# Patient Record
Sex: Male | Born: 1940 | Race: White | Hispanic: No | Marital: Married | Smoking: Former smoker
Health system: Southern US, Community
[De-identification: ages and names within clinical notes are randomized; demographics above are authoritative.]

## PROBLEM LIST (undated history)

## (undated) DIAGNOSIS — Z972 Presence of dental prosthetic device (complete) (partial): Secondary | ICD-10-CM

## (undated) DIAGNOSIS — I10 Essential (primary) hypertension: Secondary | ICD-10-CM

## (undated) DIAGNOSIS — I872 Venous insufficiency (chronic) (peripheral): Secondary | ICD-10-CM

## (undated) DIAGNOSIS — N400 Enlarged prostate without lower urinary tract symptoms: Secondary | ICD-10-CM

## (undated) DIAGNOSIS — I4891 Unspecified atrial fibrillation: Secondary | ICD-10-CM

## (undated) DIAGNOSIS — C801 Malignant (primary) neoplasm, unspecified: Secondary | ICD-10-CM

## (undated) DIAGNOSIS — R0989 Other specified symptoms and signs involving the circulatory and respiratory systems: Secondary | ICD-10-CM

## (undated) DIAGNOSIS — I350 Nonrheumatic aortic (valve) stenosis: Secondary | ICD-10-CM

## (undated) HISTORY — PX: APPENDECTOMY: SHX54

## (undated) HISTORY — DX: Venous insufficiency (chronic) (peripheral): I87.2

## (undated) HISTORY — DX: Essential (primary) hypertension: I10

## (undated) HISTORY — PX: BACK SURGERY: SHX140

## (undated) HISTORY — PX: REPLACEMENT TOTAL KNEE BILATERAL: SUR1225

---

## 2005-08-27 ENCOUNTER — Other Ambulatory Visit: Payer: Self-pay

## 2005-08-27 ENCOUNTER — Inpatient Hospital Stay: Payer: Self-pay | Admitting: Specialist

## 2005-09-03 ENCOUNTER — Ambulatory Visit: Payer: Self-pay | Admitting: Specialist

## 2005-09-07 ENCOUNTER — Emergency Department: Payer: Self-pay | Admitting: Internal Medicine

## 2005-09-24 ENCOUNTER — Ambulatory Visit: Payer: Self-pay | Admitting: Specialist

## 2015-01-19 ENCOUNTER — Ambulatory Visit: Payer: Self-pay | Admitting: Internal Medicine

## 2015-12-17 DIAGNOSIS — Z08 Encounter for follow-up examination after completed treatment for malignant neoplasm: Secondary | ICD-10-CM | POA: Diagnosis not present

## 2015-12-17 DIAGNOSIS — L570 Actinic keratosis: Secondary | ICD-10-CM | POA: Diagnosis not present

## 2015-12-17 DIAGNOSIS — D485 Neoplasm of uncertain behavior of skin: Secondary | ICD-10-CM | POA: Diagnosis not present

## 2015-12-17 DIAGNOSIS — D044 Carcinoma in situ of skin of scalp and neck: Secondary | ICD-10-CM | POA: Diagnosis not present

## 2015-12-17 DIAGNOSIS — X32XXXA Exposure to sunlight, initial encounter: Secondary | ICD-10-CM | POA: Diagnosis not present

## 2015-12-17 DIAGNOSIS — C44319 Basal cell carcinoma of skin of other parts of face: Secondary | ICD-10-CM | POA: Diagnosis not present

## 2015-12-17 DIAGNOSIS — L905 Scar conditions and fibrosis of skin: Secondary | ICD-10-CM | POA: Diagnosis not present

## 2015-12-17 DIAGNOSIS — Z85828 Personal history of other malignant neoplasm of skin: Secondary | ICD-10-CM | POA: Diagnosis not present

## 2015-12-17 DIAGNOSIS — D0439 Carcinoma in situ of skin of other parts of face: Secondary | ICD-10-CM | POA: Diagnosis not present

## 2016-01-10 DIAGNOSIS — C44321 Squamous cell carcinoma of skin of nose: Secondary | ICD-10-CM | POA: Diagnosis not present

## 2016-01-10 DIAGNOSIS — Z85828 Personal history of other malignant neoplasm of skin: Secondary | ICD-10-CM | POA: Diagnosis not present

## 2016-01-17 DIAGNOSIS — Z85828 Personal history of other malignant neoplasm of skin: Secondary | ICD-10-CM | POA: Diagnosis not present

## 2016-01-17 DIAGNOSIS — C44319 Basal cell carcinoma of skin of other parts of face: Secondary | ICD-10-CM | POA: Diagnosis not present

## 2016-02-11 DIAGNOSIS — D044 Carcinoma in situ of skin of scalp and neck: Secondary | ICD-10-CM | POA: Diagnosis not present

## 2016-03-27 DIAGNOSIS — I517 Cardiomegaly: Secondary | ICD-10-CM | POA: Diagnosis not present

## 2016-03-27 DIAGNOSIS — I10 Essential (primary) hypertension: Secondary | ICD-10-CM | POA: Diagnosis not present

## 2016-03-27 DIAGNOSIS — E784 Other hyperlipidemia: Secondary | ICD-10-CM | POA: Diagnosis not present

## 2016-05-22 DIAGNOSIS — X32XXXA Exposure to sunlight, initial encounter: Secondary | ICD-10-CM | POA: Diagnosis not present

## 2016-05-22 DIAGNOSIS — Z85828 Personal history of other malignant neoplasm of skin: Secondary | ICD-10-CM | POA: Diagnosis not present

## 2016-05-22 DIAGNOSIS — Z08 Encounter for follow-up examination after completed treatment for malignant neoplasm: Secondary | ICD-10-CM | POA: Diagnosis not present

## 2016-05-22 DIAGNOSIS — L570 Actinic keratosis: Secondary | ICD-10-CM | POA: Diagnosis not present

## 2016-09-25 DIAGNOSIS — E8881 Metabolic syndrome: Secondary | ICD-10-CM | POA: Diagnosis not present

## 2016-09-25 DIAGNOSIS — Z885 Allergy status to narcotic agent status: Secondary | ICD-10-CM | POA: Diagnosis not present

## 2016-09-25 DIAGNOSIS — I517 Cardiomegaly: Secondary | ICD-10-CM | POA: Diagnosis not present

## 2016-09-25 DIAGNOSIS — M549 Dorsalgia, unspecified: Secondary | ICD-10-CM | POA: Diagnosis not present

## 2016-11-07 DIAGNOSIS — J988 Other specified respiratory disorders: Secondary | ICD-10-CM | POA: Diagnosis not present

## 2017-01-22 DIAGNOSIS — E784 Other hyperlipidemia: Secondary | ICD-10-CM | POA: Diagnosis not present

## 2017-01-22 DIAGNOSIS — I10 Essential (primary) hypertension: Secondary | ICD-10-CM | POA: Diagnosis not present

## 2017-01-22 DIAGNOSIS — Z885 Allergy status to narcotic agent status: Secondary | ICD-10-CM | POA: Diagnosis not present

## 2017-01-22 DIAGNOSIS — I517 Cardiomegaly: Secondary | ICD-10-CM | POA: Diagnosis not present

## 2017-03-10 DIAGNOSIS — C44329 Squamous cell carcinoma of skin of other parts of face: Secondary | ICD-10-CM | POA: Diagnosis not present

## 2017-03-10 DIAGNOSIS — L820 Inflamed seborrheic keratosis: Secondary | ICD-10-CM | POA: Diagnosis not present

## 2017-03-10 DIAGNOSIS — L570 Actinic keratosis: Secondary | ICD-10-CM | POA: Diagnosis not present

## 2017-03-10 DIAGNOSIS — D485 Neoplasm of uncertain behavior of skin: Secondary | ICD-10-CM | POA: Diagnosis not present

## 2017-03-10 DIAGNOSIS — Z08 Encounter for follow-up examination after completed treatment for malignant neoplasm: Secondary | ICD-10-CM | POA: Diagnosis not present

## 2017-03-10 DIAGNOSIS — Z85828 Personal history of other malignant neoplasm of skin: Secondary | ICD-10-CM | POA: Diagnosis not present

## 2017-04-21 DIAGNOSIS — D485 Neoplasm of uncertain behavior of skin: Secondary | ICD-10-CM | POA: Diagnosis not present

## 2017-04-21 DIAGNOSIS — L905 Scar conditions and fibrosis of skin: Secondary | ICD-10-CM | POA: Diagnosis not present

## 2017-04-21 DIAGNOSIS — C44329 Squamous cell carcinoma of skin of other parts of face: Secondary | ICD-10-CM | POA: Diagnosis not present

## 2017-04-21 DIAGNOSIS — L570 Actinic keratosis: Secondary | ICD-10-CM | POA: Diagnosis not present

## 2017-04-23 DIAGNOSIS — I10 Essential (primary) hypertension: Secondary | ICD-10-CM | POA: Diagnosis not present

## 2017-04-23 DIAGNOSIS — Z885 Allergy status to narcotic agent status: Secondary | ICD-10-CM | POA: Diagnosis not present

## 2017-04-23 DIAGNOSIS — E784 Other hyperlipidemia: Secondary | ICD-10-CM | POA: Diagnosis not present

## 2017-04-23 DIAGNOSIS — I517 Cardiomegaly: Secondary | ICD-10-CM | POA: Diagnosis not present

## 2017-07-20 DIAGNOSIS — K210 Gastro-esophageal reflux disease with esophagitis: Secondary | ICD-10-CM | POA: Diagnosis not present

## 2017-07-20 DIAGNOSIS — N476 Balanoposthitis: Secondary | ICD-10-CM | POA: Diagnosis not present

## 2017-07-20 DIAGNOSIS — N478 Other disorders of prepuce: Secondary | ICD-10-CM | POA: Diagnosis not present

## 2017-07-20 DIAGNOSIS — I8312 Varicose veins of left lower extremity with inflammation: Secondary | ICD-10-CM | POA: Diagnosis not present

## 2017-07-21 DIAGNOSIS — D485 Neoplasm of uncertain behavior of skin: Secondary | ICD-10-CM | POA: Diagnosis not present

## 2017-07-21 DIAGNOSIS — D0461 Carcinoma in situ of skin of right upper limb, including shoulder: Secondary | ICD-10-CM | POA: Diagnosis not present

## 2017-07-21 DIAGNOSIS — L821 Other seborrheic keratosis: Secondary | ICD-10-CM | POA: Diagnosis not present

## 2017-07-21 DIAGNOSIS — Z08 Encounter for follow-up examination after completed treatment for malignant neoplasm: Secondary | ICD-10-CM | POA: Diagnosis not present

## 2017-07-21 DIAGNOSIS — Z85828 Personal history of other malignant neoplasm of skin: Secondary | ICD-10-CM | POA: Diagnosis not present

## 2017-07-21 DIAGNOSIS — C44311 Basal cell carcinoma of skin of nose: Secondary | ICD-10-CM | POA: Diagnosis not present

## 2017-07-26 DIAGNOSIS — R300 Dysuria: Secondary | ICD-10-CM | POA: Diagnosis not present

## 2017-07-26 DIAGNOSIS — N3001 Acute cystitis with hematuria: Secondary | ICD-10-CM | POA: Diagnosis not present

## 2017-09-08 DIAGNOSIS — D0461 Carcinoma in situ of skin of right upper limb, including shoulder: Secondary | ICD-10-CM | POA: Diagnosis not present

## 2017-09-09 DIAGNOSIS — C44311 Basal cell carcinoma of skin of nose: Secondary | ICD-10-CM | POA: Diagnosis not present

## 2017-09-09 DIAGNOSIS — Z85828 Personal history of other malignant neoplasm of skin: Secondary | ICD-10-CM | POA: Diagnosis not present

## 2017-12-15 DIAGNOSIS — C44629 Squamous cell carcinoma of skin of left upper limb, including shoulder: Secondary | ICD-10-CM | POA: Diagnosis not present

## 2017-12-15 DIAGNOSIS — L570 Actinic keratosis: Secondary | ICD-10-CM | POA: Diagnosis not present

## 2017-12-15 DIAGNOSIS — Z85828 Personal history of other malignant neoplasm of skin: Secondary | ICD-10-CM | POA: Diagnosis not present

## 2017-12-15 DIAGNOSIS — D485 Neoplasm of uncertain behavior of skin: Secondary | ICD-10-CM | POA: Diagnosis not present

## 2017-12-15 DIAGNOSIS — Z08 Encounter for follow-up examination after completed treatment for malignant neoplasm: Secondary | ICD-10-CM | POA: Diagnosis not present

## 2017-12-29 ENCOUNTER — Encounter (INDEPENDENT_AMBULATORY_CARE_PROVIDER_SITE_OTHER): Payer: Self-pay | Admitting: Vascular Surgery

## 2017-12-29 ENCOUNTER — Ambulatory Visit (INDEPENDENT_AMBULATORY_CARE_PROVIDER_SITE_OTHER): Payer: Medicare Other | Admitting: Vascular Surgery

## 2017-12-29 VITALS — BP 148/94 | HR 73 | Resp 18 | Ht 73.0 in | Wt 262.0 lb

## 2017-12-29 DIAGNOSIS — M7989 Other specified soft tissue disorders: Secondary | ICD-10-CM

## 2017-12-29 DIAGNOSIS — I10 Essential (primary) hypertension: Secondary | ICD-10-CM | POA: Insufficient documentation

## 2017-12-29 DIAGNOSIS — I83892 Varicose veins of left lower extremities with other complications: Secondary | ICD-10-CM | POA: Diagnosis not present

## 2017-12-29 NOTE — Assessment & Plan Note (Signed)
blood pressure control important in reducing the progression of atherosclerotic disease. On appropriate oral medications.  

## 2017-12-29 NOTE — Patient Instructions (Signed)

## 2017-12-29 NOTE — Assessment & Plan Note (Signed)
Likely from venous disease but could have a component of lymphedema or other issues as well.  Venous reflux study pending.  Continue to wear compression stockings, elevate his legs, and increase his activity.

## 2017-12-29 NOTE — Progress Notes (Signed)
Patient ID: Mark Blevins, male   DOB: 1941/09/09, 77 y.o.   MRN: [id number]  Chief Complaint  Patient presents with  . New Patient (Initial Visit)    Left leg swelling    HPI Mark Blevins is a 77 y.o. male.  Patient present for evaluation of worsening left leg swelling and varicose veins.  He has been previously seen in the practice but not for about 4 years.  At that time, he had what sounds like a laser ablation of the left great saphenous vein.  He did reasonably well but over the past several months, he has had worsening swelling, pain, and varicosities developing in the left lower leg.  This gets better with elevation but progresses throughout the day.  The patient reports no antecedent history of DVT or superficial thrombophlebitis.  No right leg symptoms.  There is no clear inciting event that started the symptoms.  He wears compression stockings regularly and has since he was seen many years ago.  Current Outpatient Medications  Medication Sig Dispense Refill  . amLODipine (NORVASC) 10 MG tablet TK 1 T PO D  1  . cloNIDine (CATAPRES) 0.1 MG tablet TK 1 T PO D  3  . labetalol (NORMODYNE) 200 MG tablet TK 1 T PO D  3   No current facility-administered medications for this visit.      Past Medical History:  Diagnosis Date  . Hypertension     Past Surgical History Venous ablation of the left leg  Family History No bleeding disorders, clotting disorders, autoimmune diseases, or aneurysms  Social History Social History   Tobacco Use  . Smoking status: Former Games developer  . Smokeless tobacco: Never Used  . Tobacco comment: quit smoking 1969 or 1970  Substance Use Topics  . Alcohol use: Yes    Comment: socially  . Drug use: Not on file     No Known Allergies      REVIEW OF SYSTEMS (Negative unless checked)  Constitutional: [] Weight loss  [] Fever  [] Chills Cardiac: [] Chest pain   [] Chest pressure   [] Palpitations   [] Shortness of breath when laying flat    [] Shortness of breath at rest   [] Shortness of breath with exertion. Vascular:  [] Pain in legs with walking   [] Pain in legs at rest   [] Pain in legs when laying flat   [] Claudication   [] Pain in feet when walking  [] Pain in feet at rest  [] Pain in feet when laying flat   [] History of DVT   [] Phlebitis   [x] Swelling in legs   [x] Varicose veins   [] Non-healing ulcers Pulmonary:   [] Uses home oxygen   [] Productive cough   [] Hemoptysis   [] Wheeze  [] COPD   [] Asthma Neurologic:  [] Dizziness  [] Blackouts   [] Seizures   [] History of stroke   [] History of TIA  [] Aphasia   [] Temporary blindness   [] Dysphagia   [] Weakness or numbness in arms   [] Weakness or numbness in legs Musculoskeletal:  [x] Arthritis   [] Joint swelling   [] Joint pain   [] Low back pain Hematologic:  [] Easy bruising  [] Easy bleeding   [] Hypercoagulable state   [] Anemic  [] Hepatitis  Gastrointestinal:  [] Blood in stool   [] Vomiting blood  [] Gastroesophageal reflux/heartburn   [] Difficulty swallowing   Genitourinary:  [] Chronic kidney disease   [] Difficult urination  [] Frequent urination  [] Burning with urination   [] Blood in urine Skin:  [] Rashes   [] Ulcers   [] Wounds Psychological:  [] History of anxiety   []   History of major depression.  Physical Exam BP (!) 148/94 (BP Location: Left Arm, Patient Position: Sitting)   Pulse 73   Resp 18   Ht 6\' 1"  (1.854 m)   Wt 118.8 kg (262 lb)   BMI 34.57 kg/m   Gen:  WD/WN, NAD Head: [place]/AT, No temporalis wasting.  Ear/Nose/Throat: Hearing grossly intact, nares w/o erythema or drainage, oropharynx w/o Erythema/Exudate Eyes: Sclera non-icteric, conjunctiva clear Neck: Trachea midline.  No JVD.  Pulmonary:  Good air movement, no use of accessory muscles.  Cardiac: RRR, no JVD Vascular:  Vessel Right Left  Radial Palpable Palpable                          PT Palpable Not Palpable  DP Palpable 1+ Palpable    Musculoskeletal: M/S 5/5 throughout.  Extremities without ischemic changes.   No deformity or atrophy.  No right lower extremity edema.  2+ left lower extremity edema. Varicosities diffuse and measuring 3-4 mm in the left lower extremity.  Scattered in the right leg. Neurologic:  Sensation grossly intact in extremities.  Symmetrical.  Speech is fluent. Motor exam as listed above. Psychiatric: Judgment intact, Mood & affect appropriate for pt's clinical situation. Dermatologic: No rashes or ulcers noted.  No cellulitis or open wounds.     Radiology No results found.  Labs No results found for this or any previous visit (from the past 2160 hour(s)).  Assessment/Plan:  Hypertension blood pressure control important in reducing the progression of atherosclerotic disease. On appropriate oral medications.   Swelling of limb Likely from venous disease but could have a component of lymphedema or other issues as well.  Venous reflux study pending.  Continue to wear compression stockings, elevate his legs, and increase his activity.  Varicose veins of leg with swelling, left The patient has recurrent venous symptoms of the left lower extremity.  This is despite using appropriate conservative therapy for years now.  I have recommended a venous duplex for further evaluation to determine what veins are involved in the appropriate therapies may be going forward.  The patient and I had a long discussion regarding the pathophysiology and natural history of venous disease.  I will see him back after his study.     Festus BarrenJason Anastasya Jewell 12/29/2017, 2:14 PM   This note was created with [facility] medical dictation system.  Any errors from dictation are unintentional.

## 2017-12-29 NOTE — Assessment & Plan Note (Signed)
The patient has recurrent venous symptoms of the left lower extremity.  This is despite using appropriate conservative therapy for years now.  I have recommended a venous duplex for further evaluation to determine what veins are involved in the appropriate therapies may be going forward.  The patient and I had a long discussion regarding the pathophysiology and natural history of venous disease.  I will see him back after his study.

## 2018-01-21 DIAGNOSIS — C44629 Squamous cell carcinoma of skin of left upper limb, including shoulder: Secondary | ICD-10-CM | POA: Diagnosis not present

## 2018-01-21 DIAGNOSIS — D0462 Carcinoma in situ of skin of left upper limb, including shoulder: Secondary | ICD-10-CM | POA: Diagnosis not present

## 2018-01-21 DIAGNOSIS — L905 Scar conditions and fibrosis of skin: Secondary | ICD-10-CM | POA: Diagnosis not present

## 2018-02-15 DIAGNOSIS — K210 Gastro-esophageal reflux disease with esophagitis: Secondary | ICD-10-CM | POA: Diagnosis not present

## 2018-02-15 DIAGNOSIS — E8881 Metabolic syndrome: Secondary | ICD-10-CM | POA: Diagnosis not present

## 2018-02-15 DIAGNOSIS — N478 Other disorders of prepuce: Secondary | ICD-10-CM | POA: Diagnosis not present

## 2018-02-15 DIAGNOSIS — N476 Balanoposthitis: Secondary | ICD-10-CM | POA: Diagnosis not present

## 2018-03-30 ENCOUNTER — Ambulatory Visit (INDEPENDENT_AMBULATORY_CARE_PROVIDER_SITE_OTHER): Payer: Medicare Other

## 2018-03-30 ENCOUNTER — Encounter (INDEPENDENT_AMBULATORY_CARE_PROVIDER_SITE_OTHER): Payer: Self-pay | Admitting: Vascular Surgery

## 2018-03-30 ENCOUNTER — Ambulatory Visit (INDEPENDENT_AMBULATORY_CARE_PROVIDER_SITE_OTHER): Payer: Medicare Other | Admitting: Vascular Surgery

## 2018-03-30 VITALS — BP 135/96 | HR 78 | Resp 15 | Ht 73.0 in | Wt 256.0 lb

## 2018-03-30 DIAGNOSIS — I10 Essential (primary) hypertension: Secondary | ICD-10-CM | POA: Diagnosis not present

## 2018-03-30 DIAGNOSIS — I83892 Varicose veins of left lower extremities with other complications: Secondary | ICD-10-CM

## 2018-03-30 NOTE — Assessment & Plan Note (Signed)
The venous reflux study demonstrates reflux in what appears to be an accessory great saphenous vein on the left.  This course is more medially and superficially than a typical saphenous vein in the great saphenous vein was ablated some years ago.  No DVT or superficial thrombophlebitis was identified.  That said, as he has had significant improvement with conservative measures, I think avoidance of intervention at this time and continuing his conservative therapy would be a better option.  He is in agreement with this.  I will see him back in 5 to 6 months to see how he is doing with continued conservative therapy.

## 2018-03-30 NOTE — Assessment & Plan Note (Signed)
blood pressure control important in reducing the progression of atherosclerotic disease. On appropriate oral medications.  

## 2018-03-30 NOTE — Progress Notes (Signed)
Patient ID: Mark Blevins, male   DOB: [birth year], 77 y.o.   MRN: [id number]  Chief Complaint  Patient presents with  . Follow-up    3 month LLE venous reflux    HPI Mark Blevins is a 77 y.o. male.  Patient returns in follow up of their venous disease.  They have done their best to comply with the prescribed conservative therapies of compression stockings, leg elevation, exercise, and still requires anti-inflammatories for discomfort.  He is actually doing a fair bit better and the swelling in the left leg is significantly proved.  It is still swollen, but much better.  The biggest improvement he says is due to a marked increase in his walking.  The venous reflux study demonstrates reflux in what appears to be an accessory great saphenous vein on the left.  This course is more medially and superficially than a typical saphenous vein in the great saphenous vein was ablated some years ago.  No DVT or superficial thrombophlebitis was identified.    Current Outpatient Medications  Medication Sig Dispense Refill  . amLODipine (NORVASC) 10 MG tablet TK 1 T PO D  1  . cloNIDine (CATAPRES) 0.1 MG tablet TK 1 T PO D  3  . labetalol (NORMODYNE) 200 MG tablet TK 1 T PO D  3   No current facility-administered medications for this visit.          Past Medical History:  Diagnosis Date  . Hypertension     Past Surgical History Venous ablation of the left leg  Family History No bleeding disorders, clotting disorders, autoimmune diseases, or aneurysms  Social History Social History        Tobacco Use  . Smoking status: Former Games developer  . Smokeless tobacco: Never Used  . Tobacco comment: quit smoking 1969 or 1970  Substance Use Topics  . Alcohol use: Yes    Comment: socially  . Drug use: Not on file     No Known Allergies      REVIEW OF SYSTEMS (Negative unless checked)  Constitutional: Weight loss  Fever  Chills Cardiac: Chest pain   Chest  pressure   Palpitations   Shortness of breath when laying flat   Shortness of breath at rest   Shortness of breath with exertion. Vascular:  Pain in legs with walking   Pain in legs at rest   Pain in legs when laying flat   Claudication   Pain in feet when walking  Pain in feet at rest  Pain in feet when laying flat   History of DVT   Phlebitis   Swelling in legs   Varicose veins   Non-healing ulcers Pulmonary:   Uses home oxygen   Productive cough   Hemoptysis   Wheeze  COPD   Asthma Neurologic:  Dizziness  Blackouts   Seizures   History of stroke   History of TIA  Aphasia   Temporary blindness   Dysphagia   Weakness or numbness in arms   Weakness or numbness in legs Musculoskeletal:  Arthritis   Joint swelling   Joint pain   Low back pain Hematologic:  Easy bruising  Easy bleeding   Hypercoagulable state   Anemic  Hepatitis  Gastrointestinal:  Blood in stool   Vomiting blood  Gastroesophageal reflux/heartburn   Difficulty swallowing   Genitourinary:  Chronic kidney disease   Difficult urination  Frequent urination  Burning with urination   Blood in urine Skin:  Rashes     Ulcers   Wounds Psychological:  History of anxiety    History of major depression.      Physical Exam BP (!) 135/96 (BP Location: Right Arm, Patient Position: Sitting)   Pulse 78   Resp 15   Ht  (1.854 m)   Wt 256 lb (116.1 kg)   BMI 33.78 kg/m  Gen:  WD/WN, NAD Head: /AT, No temporalis wasting.  Ear/Nose/Throat: Hearing grossly intact, dentition good Eyes: Sclera non-icteric. Conjunctiva clear Neck: Supple. Trachea midline Pulmonary:  Good air movement, no use of accessory muscles, respirations not labored.  Cardiac: RRR, No JVD Vascular: Varicosities scattered and measuring up to 1-2 mm in the right lower extremity        Varicosities diffuse and measuring up to 3 mm in the left lower  extremity Vessel Right Left  Radial Palpable Palpable                          PT  plus palpable  trace palpable  DP Palpable Palpable    Musculoskeletal: M/S 5/5 throughout.   Trace RLE edema.  1-2 + LLE edema Neurologic: Sensation grossly intact in extremities.  Symmetrical.  Speech is fluent.  Psychiatric: Judgment intact, Mood & affect appropriate for pt's clinical situation. Dermatologic: No rashes or ulcers noted.  No cellulitis or open wounds.  Radiology No results found.  Labs No results found for this or any previous visit (from the past 2160 hour(s)).  Assessment/Plan:  Hypertension blood pressure control important in reducing the progression of atherosclerotic disease. On appropriate oral medications.   Varicose veins of leg with swelling, left The venous reflux study demonstrates reflux in what appears to be an accessory great saphenous vein on the left.  This course is more medially and superficially than a typical saphenous vein in the great saphenous vein was ablated some years ago.  No DVT or superficial thrombophlebitis was identified.  That said, as he has had significant improvement with conservative measures, I think avoidance of intervention at this time and continuing his conservative therapy would be a better option.  He is in agreement with this.  I will see him back in 5 to 6 months to see how he is doing with continued conservative therapy.       Festus Barren 03/30/2018, 3:55 PM

## 2018-04-27 DIAGNOSIS — L570 Actinic keratosis: Secondary | ICD-10-CM | POA: Diagnosis not present

## 2018-04-27 DIAGNOSIS — Z85828 Personal history of other malignant neoplasm of skin: Secondary | ICD-10-CM | POA: Diagnosis not present

## 2018-04-27 DIAGNOSIS — Z08 Encounter for follow-up examination after completed treatment for malignant neoplasm: Secondary | ICD-10-CM | POA: Diagnosis not present

## 2018-04-27 DIAGNOSIS — D0462 Carcinoma in situ of skin of left upper limb, including shoulder: Secondary | ICD-10-CM | POA: Diagnosis not present

## 2018-04-27 DIAGNOSIS — D485 Neoplasm of uncertain behavior of skin: Secondary | ICD-10-CM | POA: Diagnosis not present

## 2018-06-14 DIAGNOSIS — I8312 Varicose veins of left lower extremity with inflammation: Secondary | ICD-10-CM | POA: Diagnosis not present

## 2018-06-14 DIAGNOSIS — E8881 Metabolic syndrome: Secondary | ICD-10-CM | POA: Diagnosis not present

## 2018-06-14 DIAGNOSIS — K210 Gastro-esophageal reflux disease with esophagitis: Secondary | ICD-10-CM | POA: Diagnosis not present

## 2018-06-14 DIAGNOSIS — E785 Hyperlipidemia, unspecified: Secondary | ICD-10-CM | POA: Diagnosis not present

## 2018-07-01 DIAGNOSIS — D0462 Carcinoma in situ of skin of left upper limb, including shoulder: Secondary | ICD-10-CM | POA: Diagnosis not present

## 2018-08-31 ENCOUNTER — Ambulatory Visit (INDEPENDENT_AMBULATORY_CARE_PROVIDER_SITE_OTHER): Payer: Medicare Other | Admitting: Vascular Surgery

## 2018-09-13 DIAGNOSIS — E8881 Metabolic syndrome: Secondary | ICD-10-CM | POA: Diagnosis not present

## 2018-09-13 DIAGNOSIS — I8312 Varicose veins of left lower extremity with inflammation: Secondary | ICD-10-CM | POA: Diagnosis not present

## 2018-09-13 DIAGNOSIS — N476 Balanoposthitis: Secondary | ICD-10-CM | POA: Diagnosis not present

## 2018-09-13 DIAGNOSIS — K210 Gastro-esophageal reflux disease with esophagitis: Secondary | ICD-10-CM | POA: Diagnosis not present

## 2018-09-16 DIAGNOSIS — N529 Male erectile dysfunction, unspecified: Secondary | ICD-10-CM | POA: Diagnosis not present

## 2018-09-16 DIAGNOSIS — I10 Essential (primary) hypertension: Secondary | ICD-10-CM | POA: Diagnosis not present

## 2018-09-16 DIAGNOSIS — E7849 Other hyperlipidemia: Secondary | ICD-10-CM | POA: Diagnosis not present

## 2018-09-16 DIAGNOSIS — E119 Type 2 diabetes mellitus without complications: Secondary | ICD-10-CM | POA: Diagnosis not present

## 2018-10-18 DIAGNOSIS — E8881 Metabolic syndrome: Secondary | ICD-10-CM | POA: Diagnosis not present

## 2018-10-18 DIAGNOSIS — I8312 Varicose veins of left lower extremity with inflammation: Secondary | ICD-10-CM | POA: Diagnosis not present

## 2018-10-18 DIAGNOSIS — N476 Balanoposthitis: Secondary | ICD-10-CM | POA: Diagnosis not present

## 2018-10-18 DIAGNOSIS — I517 Cardiomegaly: Secondary | ICD-10-CM | POA: Diagnosis not present

## 2018-10-22 ENCOUNTER — Telehealth: Payer: Self-pay

## 2018-10-22 NOTE — Telephone Encounter (Signed)
LVM returning patients call regarding colonoscopy.  Asked him to call back.  Thanks [employer]le

## 2018-10-22 NOTE — Telephone Encounter (Signed)
Patient is returning [place] call. Has a few concerns abt having the procedure. Would like to speak with Marcelino DusterMichelle. Best time to call is on Mon.

## 2018-11-15 DIAGNOSIS — Z08 Encounter for follow-up examination after completed treatment for malignant neoplasm: Secondary | ICD-10-CM | POA: Diagnosis not present

## 2018-11-15 DIAGNOSIS — Z85828 Personal history of other malignant neoplasm of skin: Secondary | ICD-10-CM | POA: Diagnosis not present

## 2018-11-15 DIAGNOSIS — L570 Actinic keratosis: Secondary | ICD-10-CM | POA: Diagnosis not present

## 2018-11-15 DIAGNOSIS — L821 Other seborrheic keratosis: Secondary | ICD-10-CM | POA: Diagnosis not present

## 2018-11-29 ENCOUNTER — Encounter: Payer: Self-pay | Admitting: *Deleted

## 2019-01-24 DIAGNOSIS — I517 Cardiomegaly: Secondary | ICD-10-CM | POA: Diagnosis not present

## 2019-01-24 DIAGNOSIS — N476 Balanoposthitis: Secondary | ICD-10-CM | POA: Diagnosis not present

## 2019-01-24 DIAGNOSIS — I8312 Varicose veins of left lower extremity with inflammation: Secondary | ICD-10-CM | POA: Diagnosis not present

## 2019-01-24 DIAGNOSIS — E8881 Metabolic syndrome: Secondary | ICD-10-CM | POA: Diagnosis not present

## 2019-04-24 ENCOUNTER — Encounter: Payer: Self-pay | Admitting: Radiology

## 2019-04-24 ENCOUNTER — Emergency Department: Payer: Medicare Other | Admitting: Certified Registered Nurse Anesthetist

## 2019-04-24 ENCOUNTER — Encounter
Admission: EM | Disposition: A | Discharge status: Home / Self Care | Payer: Self-pay | Source: Home / Self Care | Attending: Surgery

## 2019-04-24 ENCOUNTER — Inpatient Hospital Stay
Admission: EM | Admit: 2019-04-24 | Discharge: 2019-04-25 | DRG: 389 | Disposition: A | Discharge status: Home / Self Care | Payer: Medicare Other | Attending: Surgery | Admitting: Surgery

## 2019-04-24 ENCOUNTER — Emergency Department: Payer: Medicare Other

## 2019-04-24 ENCOUNTER — Other Ambulatory Visit: Payer: Self-pay

## 2019-04-24 DIAGNOSIS — R109 Unspecified abdominal pain: Secondary | ICD-10-CM | POA: Diagnosis present

## 2019-04-24 DIAGNOSIS — K56609 Unspecified intestinal obstruction, unspecified as to partial versus complete obstruction: Secondary | ICD-10-CM | POA: Diagnosis not present

## 2019-04-24 DIAGNOSIS — N390 Urinary tract infection, site not specified: Secondary | ICD-10-CM | POA: Diagnosis present

## 2019-04-24 DIAGNOSIS — I4891 Unspecified atrial fibrillation: Secondary | ICD-10-CM | POA: Diagnosis not present

## 2019-04-24 DIAGNOSIS — Z885 Allergy status to narcotic agent status: Secondary | ICD-10-CM | POA: Diagnosis not present

## 2019-04-24 DIAGNOSIS — R1030 Lower abdominal pain, unspecified: Secondary | ICD-10-CM | POA: Diagnosis not present

## 2019-04-24 DIAGNOSIS — K562 Volvulus: Principal | ICD-10-CM | POA: Diagnosis present

## 2019-04-24 DIAGNOSIS — Z87891 Personal history of nicotine dependence: Secondary | ICD-10-CM

## 2019-04-24 DIAGNOSIS — Z79899 Other long term (current) drug therapy: Secondary | ICD-10-CM | POA: Diagnosis not present

## 2019-04-24 DIAGNOSIS — Z1159 Encounter for screening for other viral diseases: Secondary | ICD-10-CM | POA: Diagnosis not present

## 2019-04-24 DIAGNOSIS — I10 Essential (primary) hypertension: Secondary | ICD-10-CM | POA: Diagnosis not present

## 2019-04-24 DIAGNOSIS — Z03818 Encounter for observation for suspected exposure to other biological agents ruled out: Secondary | ICD-10-CM | POA: Diagnosis not present

## 2019-04-24 HISTORY — PX: COLONOSCOPY WITH PROPOFOL: SHX5780

## 2019-04-24 HISTORY — DX: Benign prostatic hyperplasia without lower urinary tract symptoms: N40.0

## 2019-04-24 LAB — TROPONIN I: Troponin I: <0.03 ng/mL

## 2019-04-24 LAB — CBC
HCT: 43.6 % (ref 39.0–52.0)
Hemoglobin: 14.8 g/dL (ref 13.0–17.0)
MCH: 34.3 pg — ABNORMAL HIGH (ref 26.0–34.0)
MCHC: 33.9 g/dL (ref 30.0–36.0)
MCV: 100.9 fL — ABNORMAL HIGH (ref 80.0–100.0)
Platelets: 197 K/uL (ref 150–400)
RBC: 4.32 MIL/uL (ref 4.22–5.81)
RDW: 12.6 % (ref 11.5–15.5)
WBC: 6 K/uL (ref 4.0–10.5)
nRBC: 0 % (ref 0.0–0.2)

## 2019-04-24 LAB — COMPREHENSIVE METABOLIC PANEL
ALT: 18 U/L (ref 0–44)
AST: 25 U/L (ref 15–41)
Albumin: 4.5 g/dL (ref 3.5–5.0)
Alkaline Phosphatase: 62 U/L (ref 38–126)
Anion gap: 12 (ref 5–15)
BUN: 16 mg/dL (ref 8–23)
CO2: 22 mmol/L (ref 22–32)
Calcium: 9 mg/dL (ref 8.9–10.3)
Chloride: 104 mmol/L (ref 98–111)
Creatinine, Ser: 1.2 mg/dL (ref 0.61–1.24)
GFR calc Af Amer: >60 mL/min (ref >60)
GFR calc non Af Amer: 57 mL/min — ABNORMAL LOW (ref >60)
Glucose, Bld: 119 mg/dL — ABNORMAL HIGH (ref 70–99)
Potassium: 2.8 mmol/L — ABNORMAL LOW (ref 3.5–5.1)
Sodium: 138 mmol/L (ref 135–145)
Total Bilirubin: 1.6 mg/dL — ABNORMAL HIGH (ref 0.3–1.2)
Total Protein: 8 g/dL (ref 6.5–8.1)

## 2019-04-24 LAB — SARS CORONAVIRUS 2 BY RT PCR (HOSPITAL ORDER, PERFORMED IN ~~LOC~~ HOSPITAL LAB): SARS Coronavirus 2: NEGATIVE

## 2019-04-24 LAB — LIPASE, BLOOD: Lipase: 30 U/L (ref 11–51)

## 2019-04-24 SURGERY — COLONOSCOPY WITH PROPOFOL
Anesthesia: General

## 2019-04-24 SURGERY — COLONOSCOPY
Anesthesia: General

## 2019-04-24 MED ORDER — PROPOFOL 10 MG/ML IV BOLUS
INTRAVENOUS | Status: DC | PRN
  Administered 2019-04-24: 60 mg via INTRAVENOUS

## 2019-04-24 MED ORDER — LACTATED RINGERS IV SOLN
INTRAVENOUS | Status: DC
  Administered 2019-04-24 – 2019-04-25 (×2): via INTRAVENOUS

## 2019-04-24 MED ORDER — ONDANSETRON HCL 4 MG/2ML IJ SOLN
4.0000 mg | Freq: Once | INTRAMUSCULAR | Status: AC
  Administered 2019-04-24: 4 mg via INTRAVENOUS
  Filled 2019-04-24: qty 2

## 2019-04-24 MED ORDER — PROPOFOL 500 MG/50ML IV EMUL
INTRAVENOUS | Status: DC | PRN
  Administered 2019-04-24: 140 ug/kg/min via INTRAVENOUS

## 2019-04-24 MED ORDER — SIMETHICONE 80 MG PO CHEW
40.0000 mg | CHEWABLE_TABLET | Freq: Four times a day (QID) | ORAL | Status: DC | PRN
  Filled 2019-04-24: qty 1

## 2019-04-24 MED ORDER — ONDANSETRON 4 MG PO TBDP: 4.0000 mg | ORAL_TABLET | Freq: Four times a day (QID) | ORAL | Status: DC | PRN

## 2019-04-24 MED ORDER — FENTANYL CITRATE (PF) 100 MCG/2ML IJ SOLN
50.0000 ug | Freq: Once | INTRAMUSCULAR | Status: AC
  Administered 2019-04-24: 50 ug via INTRAVENOUS
  Filled 2019-04-24: qty 2

## 2019-04-24 MED ORDER — ACETAMINOPHEN 650 MG RE SUPP: 650.0000 mg | Freq: Four times a day (QID) | RECTAL | Status: DC | PRN

## 2019-04-24 MED ORDER — ACETAMINOPHEN 325 MG PO TABS: 650.0000 mg | ORAL_TABLET | Freq: Four times a day (QID) | ORAL | Status: DC | PRN

## 2019-04-24 MED ORDER — PANTOPRAZOLE SODIUM 40 MG IV SOLR
40.0000 mg | Freq: Every day | INTRAVENOUS | Status: DC
  Administered 2019-04-24: 40 mg via INTRAVENOUS
  Filled 2019-04-24: qty 40

## 2019-04-24 MED ORDER — ONDANSETRON HCL 4 MG/2ML IJ SOLN: 4.0000 mg | Freq: Four times a day (QID) | INTRAMUSCULAR | Status: DC | PRN

## 2019-04-24 MED ORDER — FENTANYL CITRATE (PF) 100 MCG/2ML IJ SOLN
25.0000 ug | Freq: Once | INTRAMUSCULAR | Status: AC
  Administered 2019-04-24: 25 ug via INTRAVENOUS
  Filled 2019-04-24: qty 2

## 2019-04-24 MED ORDER — ADULT MULTIVITAMIN W/MINERALS CH
1.0000 | ORAL_TABLET | Freq: Every day | ORAL | Status: DC
  Administered 2019-04-25: 1 via ORAL
  Filled 2019-04-24: qty 1

## 2019-04-24 MED ORDER — LIDOCAINE HCL (PF) 2 % IJ SOLN
INTRAMUSCULAR | Status: AC
  Filled 2019-04-24: qty 10

## 2019-04-24 MED ORDER — POTASSIUM CHLORIDE 10 MEQ/100ML IV SOLN
10.0000 meq | Freq: Once | INTRAVENOUS | Status: AC
  Administered 2019-04-24: 10 meq via INTRAVENOUS
  Filled 2019-04-24: qty 100

## 2019-04-24 MED ORDER — IOHEXOL 300 MG/ML  SOLN
125.0000 mL | Freq: Once | INTRAMUSCULAR | Status: AC | PRN
  Administered 2019-04-24: 125 mL via INTRAVENOUS

## 2019-04-24 MED ORDER — FENTANYL CITRATE (PF) 100 MCG/2ML IJ SOLN: 25.0000 ug | INTRAMUSCULAR | Status: DC | PRN

## 2019-04-24 MED ORDER — SODIUM CHLORIDE 0.9 % IV SOLN
INTRAVENOUS | Status: DC
  Administered 2019-04-24: 11:00:00 via INTRAVENOUS

## 2019-04-24 MED ORDER — LABETALOL HCL 200 MG PO TABS
200.0000 mg | ORAL_TABLET | Freq: Once | ORAL | Status: DC
  Filled 2019-04-24: qty 1

## 2019-04-24 MED ORDER — LIDOCAINE HCL (PF) 2 % IJ SOLN
INTRAMUSCULAR | Status: DC | PRN
  Administered 2019-04-24: 100 mg via INTRADERMAL

## 2019-04-24 MED ORDER — IOHEXOL 240 MG/ML SOLN
50.0000 mL | Freq: Once | INTRAMUSCULAR | Status: AC | PRN
  Administered 2019-04-24: 50 mL via ORAL

## 2019-04-24 MED ORDER — CLONIDINE HCL 0.1 MG PO TABS
0.1000 mg | ORAL_TABLET | Freq: Every day | ORAL | Status: DC
  Administered 2019-04-24 – 2019-04-25 (×2): 0.1 mg via ORAL
  Filled 2019-04-24 (×2): qty 1

## 2019-04-24 MED ORDER — AMLODIPINE BESYLATE 10 MG PO TABS
10.0000 mg | ORAL_TABLET | Freq: Every day | ORAL | Status: DC
  Administered 2019-04-25: 10 mg via ORAL
  Filled 2019-04-24: qty 1

## 2019-04-24 MED ORDER — ONDANSETRON HCL 4 MG/2ML IJ SOLN: 4.0000 mg | Freq: Once | INTRAMUSCULAR | Status: DC | PRN

## 2019-04-24 MED ORDER — ENOXAPARIN SODIUM 40 MG/0.4ML ~~LOC~~ SOLN
40.0000 mg | SUBCUTANEOUS | Status: DC
  Administered 2019-04-25: 40 mg via SUBCUTANEOUS
  Filled 2019-04-24: qty 0.4

## 2019-04-24 MED ORDER — PROPOFOL 500 MG/50ML IV EMUL
INTRAVENOUS | Status: AC
  Filled 2019-04-24: qty 50

## 2019-04-24 NOTE — Transfer of Care (Addendum)
Immediate Anesthesia Transfer of Care Note  Patient: Mark Blevins  Procedure(s) Performed: COLONOSCOPY WITH PROPOFOL (N/A )  Patient Location: PACU  Anesthesia Type:General  Level of Consciousness: awake, alert  and oriented  Airway & Oxygen Therapy: Patient Spontanous Breathing and Patient connected to nasal cannula oxygen  Post-op Assessment: Report given to RN and Post -op Vital signs reviewed and stable  Post vital signs: Reviewed and stable  Last Vitals:  Vitals Value Taken Time  BP 114/65   Temp    Pulse 73   Resp 15   SpO2 100     Last Pain:  Vitals:   04/24/19 1052  TempSrc: Oral  PainSc: 10-Worst pain ever         Complications: No apparent anesthesia complications

## 2019-04-24 NOTE — ED Notes (Signed)
Left to endoscopy with dava RN.

## 2019-04-24 NOTE — Op Note (Signed)
[facility] Patient Name: Mark SpellerCarl Blevins Procedure Date: 04/24/2019 9:27 AM MRN: [id number] Account #: [id number] Date of Birth: Aug 24, 1941 Admit Type: Inpatient Age: [age] [facility] Gender: Male Note Status: Finalized Procedure:            Colonoscopy Indications:          For therapy of volvulus Providers:            Arville GoIsami Sakia MD, MD Medicines:            Propofol per Anesthesia Complications:        No immediate complications. Procedure:            Pre-Anesthesia Assessment:                       - Using IV propofol under the supervision of a CRNA was                        determined to be medically necessary for this procedure                        based on review of the patient's medical history,                        medications, and prior anesthesia history.                       After obtaining informed consent, the colonoscope was                        passed under direct vision. Throughout the procedure,                        the patient's blood pressure, pulse, and oxygen                        saturations were monitored continuously. The                        Colonoscope was introduced through the anus and                        advanced to the the splenic flexure for evaluation.                        This was the intended extent. The colonoscopy was                        performed without difficulty. Findings:      The perianal and digital rectal examinations were normal.      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       Decompression of the volvulus was attempted and was successful, with       complete decompression achieved. Estimated blood loss: none.       Decompression of the volvulus was attempted and was successful, with       complete decompression achieved. Impression:           - Volvulus. Successful complete decompression achieved.                       -  No specimens  collected. Recommendation:       - Admit the patient to hospital ward for ongoing care. Procedure Code(s):    --- Professional ---                       [phone], 78, Colonoscopy, flexible; with decompression                        (for pathologic distention) (eg, volvulus, megacolon),                        including placement of decompression tube, when                        performed Diagnosis Code(s):    --- Professional ---                       K56.2, Volvulus CPT copyright 2019 American Medical Association. All rights reserved. The codes documented in this report are preliminary and upon coder review may  be revised to meet current compliance requirements. Dr. Sheppard Penton, MD Eliseo Squires MD, MD 04/24/2019 11:43:15 AM This report has been signed electronically. Number of Addenda: 0 Note Initiated On: 04/24/2019 9:27 AM Total Procedure Duration: 0 hours 11 minutes 22 seconds       [facility]

## 2019-04-24 NOTE — ED Notes (Signed)
Pt remains alert and oriented. Updated wife per request

## 2019-04-24 NOTE — ED Provider Notes (Signed)
[facility] Emergency Department Provider Note ____________________________________________   First MD Initiated Contact with Patient 04/24/19 [phone]     (approximate)  I have reviewed the triage vital signs and the nursing notes.   HISTORY  Chief Complaint Abdominal Pain    HPI Mark Blevins is a 78 y.o. male with PMH as noted below who presents with upper abdominal pain over approximately the last several days, coming in waves occurring every few minutes, and severe in intensity.  It is associated with constipation and he states his last bowel movement was 4 days ago.  However he denies any nausea or vomiting.  He has no fever or urinary symptoms.  He has been passing gas and states he sometimes passes a small amount of liquid stool.  The patient states that he has had similar pain in the past but not this severe.  His only abdominal surgery was an appendectomy in the 1980s.   Past Medical History:  Diagnosis Date  . Hypertension   . Venous reflux     Patient Active Problem List   Diagnosis Date Noted  . Hypertension 12/29/2017  . Swelling of limb 12/29/2017  . Varicose veins of leg with swelling, left 12/29/2017      Prior to Admission medications   Medication Sig Start Date End Date Taking? Authorizing Provider  amLODipine (NORVASC) 10 MG tablet TK 1 T PO D 10/15/17   [provider]  cloNIDine (CATAPRES) 0.1 MG tablet TK 1 T PO D 11/07/17   [provider]  labetalol (NORMODYNE) 200 MG tablet TK 1 T PO D 10/05/17   [provider]    Allergies Morphine and related  No family history on file.  Social History Social History   Tobacco Use  . Smoking status: Former Games developermoker  . Smokeless tobacco: Never Used  . Tobacco comment: quit smoking 1969 or 1970  Substance Use Topics  . Alcohol use: Yes    Comment: socially  . Drug use: Not on file    Review of Systems  Constitutional: No fever. Eyes: No redness.  ENT: No sore throat. Cardiovascular: Denies chest pain. Respiratory: Denies shortness of breath. Gastrointestinal: No vomiting.  Positive for constipation. Genitourinary: Negative for dysuria.  Musculoskeletal: Negative for back pain. Skin: Negative for rash. Neurological: Negative for headache.   ____________________________________________   PHYSICAL EXAM:  VITAL SIGNS: ED Triage Vitals  Enc Vitals Group     BP 04/24/19 0536 140/81     Pulse Rate 04/24/19 0536 73     Resp 04/24/19 0536 18     Temp 04/24/19 0536 98 F (36.7 C)     Temp Source 04/24/19 0536 Oral     SpO2 04/24/19 0536 100 %     Weight 04/24/19 0540 230 lb (104.3 kg)     Height 04/24/19 0540 6' (1.829 m)     Head Circumference --      Peak Flow --      Pain Score 04/24/19 0540 10     Pain Loc --      Pain Edu? --      Excl. in GC? --     Constitutional: Alert and oriented. Well appearing, although with intermittent waves of pain during which she appears somewhat uncomfortable. Eyes: Conjunctivae are normal.  No scleral icterus. Head: Atraumatic. Nose: No congestion/rhinnorhea. Mouth/Throat: Mucous membranes are moist.   Neck: Normal range of motion.  Cardiovascular: Good peripheral circulation. Respiratory: Normal respiratory effort.  No retractions.  Gastrointestinal: Soft with mild bilateral upper quadrant tenderness.  Moderately distended. Genitourinary: No flank tenderness. Musculoskeletal:   Extremities warm and well perfused.  Neurologic:  Normal speech and language. No gross focal neurologic deficits are appreciated.  Skin:  Skin is warm and dry. No rash noted. Psychiatric: Mood and affect are normal. Speech and behavior are normal.  ____________________________________________   LABS (all labs ordered are listed, but only abnormal results are displayed)  Labs Reviewed  COMPREHENSIVE METABOLIC PANEL - Abnormal; Notable for the following components:      Result Value   Potassium 2.8 (*)     Glucose, Bld 119 (*)    Total Bilirubin 1.6 (*)    GFR calc non Af Amer 57 (*)    All other components within normal limits  CBC - Abnormal; Notable for the following components:   MCV 100.9 (*)    MCH 34.3 (*)    All other components within normal limits  LIPASE, BLOOD  TROPONIN I  URINALYSIS, COMPLETE (UACMP) WITH MICROSCOPIC   ____________________________________________  EKG  ED ECG REPORT I, Arta Silence, the attending physician, personally viewed and interpreted this ECG.  Date: 04/24/2019 EKG Time: 0539 Rate: 73 Rhythm: Atrial fibrillation QRS Axis: normal Intervals: normal ST/T Wave abnormalities: LVH Narrative Interpretation: no evidence of acute ischemia  ____________________________________________  RADIOLOGY  CT abdomen: Pending  ____________________________________________   PROCEDURES  Procedure(s) performed: No  Procedures  Critical Care performed: No ____________________________________________   INITIAL IMPRESSION / ASSESSMENT AND PLAN / ED COURSE  Pertinent labs & imaging results that were available during my care of the patient were reviewed by me and considered in my medical decision making (see chart for details).  78 year old male with PMH as noted above presents with several days of abdominal pain coming in intermittent waves every few minutes and associated with distention and with constipation.  However the patient states he has passed small amounts of liquid stool.  He denies fever.  On exam the patient is overall relatively comfortable appearing although every few minutes he has a wave of intense pain.  His vital signs are normal except for hypertension.  The abdomen is significantly distended and there is mild upper quadrant tenderness.  Overall presentation is most concerning for small bowel obstruction or possible volvulus.  It is possible that the patient is having ileus or this could be simple constipation with some  cramping.  We will obtain lab work-up and a CT.  ----------------------------------------- 7:11 AM on 04/24/2019 -----------------------------------------  CT is pending.  I am signing the patient out to the oncoming physician Dr. Jacqualine Code. ____________________________________________   FINAL CLINICAL IMPRESSION(S) / ED DIAGNOSES  Final diagnoses:  Abdominal pain, unspecified abdominal location      NEW MEDICATIONS STARTED DURING THIS VISIT:  New Prescriptions   No medications on file     Note:  This document was prepared using Dragon voice recognition software and may include unintentional dictation errors.    Arta Silence, MD 04/24/19 [phone]

## 2019-04-24 NOTE — Op Note (Signed)
Media Information         Document Information  Colonoscopy Electronic    04/24/2019 11:43  Attached To:  Colonoscopy [[id number]]  Hospital Encounter on 04/24/19  Source Information  [place], [place], DO

## 2019-04-24 NOTE — H&P (Addendum)
Subjective:   CC: Sigmoid volvulus  HPI:  Mark Blevins is a 78 y.o. male who was consulted by [facility] for issue above.  Symptoms were first noted 4 days ago. Pain is sharp, intermittent exacerbation, confined to the abdomen, without radiation.  Associated with constipation, exacerbated by palpation.  Had similar episodes of constipation and pain before, but not to this extent.   Past Medical History:  has a past medical history of Hypertension and Venous reflux.  Past Surgical History: appy  Family History: Reviewed and not relevant to chief complaint  Social History:  reports that he has quit smoking. He has never used smokeless tobacco. He reports current alcohol use. No history on file for drug.  Current Medications:  amLODipine (NORVASC) 10 MG tablet TK 1 T PO D [provider] Needs Review  cloNIDine (CATAPRES) 0.1 MG tablet TK 1 T PO D [provider] Needs Review  labetalol (NORMODYNE) 200 MG tablet TK 1 T PO D [provider] Needs Review   As well as baby aspirin  Allergies:  Allergies as of 04/24/2019 - Review Complete 04/24/2019  Allergen Reaction Noted  . Morphine and related Nausea And Vomiting 04/24/2019    ROS:  General: Denies weight loss, weight gain, fatigue, fevers, chills, and night sweats. Eyes: Denies blurry vision, double vision, eye pain, itchy eyes, and tearing. Ears: Denies hearing loss, earache, and ringing in ears. Nose: Denies sinus pain, congestion, infections, runny nose, and nosebleeds. Mouth/throat: Denies hoarseness, sore throat, bleeding gums, and difficulty swallowing. Heart: Denies chest pain, palpitations, racing heart, irregular heartbeat, leg pain or swelling, and decreased activity tolerance. Respiratory: Denies breathing difficulty, shortness of breath, wheezing, cough, and sputum. GI: Denies change in appetite, heartburn, nausea, vomiting, diarrhea, and blood in stool. GU: Denies difficulty urinating, pain with  urinating, urgency, frequency, blood in urine. Musculoskeletal: Denies joint stiffness, pain, swelling, muscle weakness. Skin: Denies rash, itching, mass, tumors, sores, and boils Neurologic: Denies headache, fainting, dizziness, seizures, numbness, and tingling. Psychiatric: Denies depression, anxiety, difficulty sleeping, and memory loss. Endocrine: Denies heat or cold intolerance, and increased thirst or urination. Blood/lymph: Denies easy bruising, easy bruising, and swollen glands     Objective:     BP (!) 135/95   Pulse 60   Temp 98 F (36.7 C) (Oral)   Resp 15   Ht 6' (1.829 m)   Wt 104.3 kg   SpO2 97%   BMI 31.19 kg/m   Constitutional :  alert, cooperative, appears stated age and no distress  Lymphatics/Throat:  no asymmetry, masses, or scars  Respiratory:  clear to auscultation bilaterally  Cardiovascular:  regular rate and rhythm  Gastrointestinal: distended, tender all four quadrants.   Musculoskeletal: Steady gait and movement  Skin: Cool and moist, surgical scars   Psychiatric: Normal affect, non-agitated, not confused       LABS:  CMP Latest Ref Rng & Units 04/24/2019  Glucose 70 - 99 mg/dL 119(H)  BUN 8 - 23 mg/dL 16  Creatinine 0.61 - 1.24 mg/dL 1.20  Sodium 135 - 145 mmol/L 138  Potassium 3.5 - 5.1 mmol/L 2.8(L)  Chloride 98 - 111 mmol/L 104  CO2 22 - 32 mmol/L 22  Calcium 8.9 - 10.3 mg/dL 9.0  Total Protein 6.5 - 8.1 g/dL 8.0  Total Bilirubin 0.3 - 1.2 mg/dL 1.6(H)  Alkaline Phos 38 - 126 U/L 62  AST 15 - 41 U/L 25  ALT 0 - 44 U/L 18   CBC Latest Ref Rng &  Units 04/24/2019  WBC 4.0 - 10.5 K/uL 6.0  Hemoglobin 13.0 - 17.0 g/dL 16.114.8  Hematocrit 09.639.0 - 52.0 % 43.6  Platelets 150 - 400 K/uL 197    RADS: CLINICAL DATA:  Worsening abdominal pain and bloating for several days. Bowel obstruction.  EXAM: CT ABDOMEN AND PELVIS WITH CONTRAST  TECHNIQUE: Multidetector CT imaging of the abdomen and pelvis was performed using the standard protocol  following bolus administration of intravenous contrast.  CONTRAST:  125mL OMNIPAQUE IOHEXOL 300 MG/ML  SOLN  COMPARISON:  None.  FINDINGS: Lower Chest: No acute findings.  Hepatobiliary: No hepatic masses identified. Gallbladder is unremarkable.  Pancreas:  No mass or inflammatory changes.  Spleen: Within normal limits in size and appearance.  Adrenals/Urinary Tract: No masses identified. Ill-defined areas of decreased parenchymal enhancement are seen involving the left kidney as well as mild perinephric stranding. These findings are suspicious for pyelonephritis. No evidence of ureteral calculi or hydronephrosis. Unremarkable unopacified urinary bladder.  Stomach/Bowel: Sigmoid volvulus is demonstrated with marked dilatation of the sigmoid colon and edema within the sigmoid mesentery. No evidence of bowel wall thickening, pneumatosis, or free intraperitoneal air. No evidence of abscess or free fluid.  Vascular/Lymphatic: No pathologically enlarged lymph nodes. No abdominal aortic aneurysm. Aortic atherosclerosis.  Reproductive:  No mass or other significant abnormality.  Other:  None.  Musculoskeletal:  No suspicious bone lesions identified.  IMPRESSION: Acute sigmoid volvulus.  Findings suspicious for left-sided pyelonephritis. No evidence of ureteral calculi or hydronephrosis. Suggest correlation with urinalysis.  Critical Value/emergent results were called by telephone at the time of interpretation on 04/24/2019 at 7:26 am to Dr. Dionne BucySEBASTIAN SIADECKI , who verbally acknowledged these results.   Electronically Signed   By: Myles RosenthalJohn  Stahl M.D.   On: 04/24/2019 07:30  Assessment:   Acute sigmoid volvulus  Plan:     Discussed pathophisiology and treatment options including endoscopic decompression and eventual sigmoidectomy, possible ostomy.    I explained to patient we usually recommend endoscopic decompression initially in stable patients  which he fits the category.  This will allow us time to decompress the colon as well as prep the colon to minimize perioperative complications such as wound infections.  Decompressing the colon will also allow us to potentially do a primary anastomosis versus a colostomy.  I did warn him that if there is any indication of colon ischemia during the colonoscopy, we may need to urgently proceed with a surgical resection in order to avoid a perforation.  The patient verbalized understanding and all questions were answered to the patient's satisfaction.  He is agreeable to attempt at endoscopic decompression first.  GI provider notified of consult and she verbalized agreement.  I was notified of the consult at [phone], patient seen and examined by 0815, GI was notified of the colonoscopy request at 0754.  Addendum:  Notified GI unavailable for procedure at 0940.  Will proceed with colonoscopy by myself

## 2019-04-24 NOTE — Anesthesia Preprocedure Evaluation (Signed)
Anesthesia Evaluation  Patient identified by MRN, date of birth, ID band Patient awake    Reviewed: Allergy & Precautions, H&P , NPO status , Patient's Chart, lab work & pertinent test results, reviewed documented beta blocker date and time   History of Anesthesia Complications Negative for: history of anesthetic complications  Airway Mallampati: II  TM Distance: >3 FB Neck ROM: full    Dental no notable dental hx. (+) Upper Dentures, Lower Dentures   Pulmonary neg pulmonary ROS, former smoker,           Cardiovascular Exercise Tolerance: Good hypertension, (-) angina(-) CAD, (-) Past MI, (-) Cardiac Stents and (-) CABG (-) dysrhythmias (-) Valvular Problems/Murmurs     Neuro/Psych negative neurological ROS  negative psych ROS   GI/Hepatic negative GI ROS, Neg liver ROS,   Endo/Other  negative endocrine ROS  Renal/GU negative Renal ROS  negative genitourinary   Musculoskeletal   Abdominal   Peds  Hematology negative hematology ROS (+)   Anesthesia Other Findings Past Medical History: No date: Hypertension No date: Venous reflux  Patient NPO appropriate with sigmoid volvulus.  Will proceed emergent for decompression attempt via colonoscopy.  If unsuccessful, will head straight to the OR for surgical repair.  Discussed plan with patient for both procedures in case decompression attempt is unsuccessful.  He is aware of the potential risks and agrees to continue.  Reproductive/Obstetrics negative OB ROS                             Anesthesia Physical Anesthesia Plan  ASA: II and emergent  Anesthesia Plan: General   Post-op Pain Management:    Induction: Intravenous  PONV Risk Score and Plan: 2 and Propofol infusion and TIVA  Airway Management Planned: Natural Airway and Nasal Cannula  Additional Equipment:   Intra-op Plan:   Post-operative Plan:   Informed Consent: I have  reviewed the patients History and Physical, chart, labs and discussed the procedure including the risks, benefits and alternatives for the proposed anesthesia with the patient or authorized representative who has indicated his/her understanding and acceptance.     Dental Advisory Given  Plan Discussed with: Anesthesiologist, CRNA and Surgeon  Anesthesia Plan Comments:         Anesthesia Quick Evaluation

## 2019-04-24 NOTE — ED Triage Notes (Signed)
Pt states upper abd pain since Wednesday that has progressively gotten worse. Pt appears very bloated. Pt states pain is wave like approx every 4 minutes. Pt states he feels lightheaded. Pt appears very uncomfortable. Pt denies nausea or fever. Pt states last bowel movement was 04/19/19.

## 2019-04-24 NOTE — ED Notes (Signed)
Dr Jacqualine Code at bedside to update pt

## 2019-04-24 NOTE — Anesthesia Postprocedure Evaluation (Signed)
Anesthesia Post Note  Patient: Mark Blevins  Procedure(s) Performed: COLONOSCOPY WITH PROPOFOL (N/A )  Patient location during evaluation: PACU Anesthesia Type: General Level of consciousness: awake and alert Pain management: pain level controlled Vital Signs Assessment: post-procedure vital signs reviewed and stable Respiratory status: spontaneous breathing, nonlabored ventilation, respiratory function stable and patient connected to nasal cannula oxygen Cardiovascular status: blood pressure returned to baseline and stable Postop Assessment: no apparent nausea or vomiting Anesthetic complications: no     Last Vitals:  Vitals:   04/24/19 1236 04/24/19 1407  BP: (!) 147/76 119/63  Pulse: 76 64  Resp:  18  Temp: (!) 36.3 C 36.6 C  SpO2: 99% 100%    Last Pain:  Vitals:   04/24/19 1407  TempSrc: Oral  PainSc:                  [name]

## 2019-04-24 NOTE — Anesthesia Procedure Notes (Signed)
Performed by: Kayton Ripp, CRNA Pre-anesthesia Checklist: Patient identified, Emergency Drugs available, Suction available, Patient being monitored and Timeout performed Patient Re-evaluated:Patient Re-evaluated prior to induction Oxygen Delivery Method: Nasal cannula Induction Type: IV induction       

## 2019-04-24 NOTE — ED Notes (Signed)
Abdomen distended and firm on exam. Pain medicine has not helped. Dr Jacqualine Code notified.

## 2019-04-24 NOTE — ED Notes (Signed)
Pt does not want further pain meds at this time. Report to dava with endoscopy

## 2019-04-24 NOTE — Anesthesia Post-op Follow-up Note (Signed)
Anesthesia QCDR form completed.        

## 2019-04-24 NOTE — ED Provider Notes (Addendum)
CT discussed with radiologist.  Concern for sigmoid volvulus.  I have called and spoken with Dr. Lisabeth Pick, he will be coming to see the patient in consultation.   Delman Kitten, MD 04/24/19 0733  ----------------------------------------- 8:21 AM on 04/24/2019 -----------------------------------------  Patient being admitted to general surgical service, Dr. Lysle Pearl with gastroenterology Dr. Marius Ditch consulting urgently    Delman Kitten, MD 04/24/19 [phone]

## 2019-04-25 ENCOUNTER — Encounter: Payer: Self-pay | Admitting: Surgery

## 2019-04-25 LAB — MAGNESIUM: Magnesium: 2 mg/dL (ref 1.7–2.4)

## 2019-04-25 LAB — URINALYSIS, COMPLETE (UACMP) WITH MICROSCOPIC
Bilirubin Urine: NEGATIVE
Glucose, UA: NEGATIVE mg/dL
Ketones, ur: 20 mg/dL — AB
Nitrite: POSITIVE — AB
Protein, ur: NEGATIVE mg/dL
Specific Gravity, Urine: 1.014 (ref 1.005–1.030)
Squamous Epithelial / HPF: NONE SEEN (ref 0–5)
WBC, UA: >50 WBC/hpf — ABNORMAL HIGH (ref 0–5)
pH: 6 (ref 5.0–8.0)

## 2019-04-25 LAB — BASIC METABOLIC PANEL
Anion gap: 10 (ref 5–15)
BUN: 14 mg/dL (ref 8–23)
CO2: 25 mmol/L (ref 22–32)
Calcium: 8.2 mg/dL — ABNORMAL LOW (ref 8.9–10.3)
Chloride: 104 mmol/L (ref 98–111)
Creatinine, Ser: 1.3 mg/dL — ABNORMAL HIGH (ref 0.61–1.24)
GFR calc Af Amer: >60 mL/min (ref >60)
GFR calc non Af Amer: 52 mL/min — ABNORMAL LOW (ref >60)
Glucose, Bld: 87 mg/dL (ref 70–99)
Potassium: 2.6 mmol/L — CL (ref 3.5–5.1)
Sodium: 139 mmol/L (ref 135–145)

## 2019-04-25 LAB — CBC
HCT: 37 % — ABNORMAL LOW (ref 39.0–52.0)
Hemoglobin: 12.9 g/dL — ABNORMAL LOW (ref 13.0–17.0)
MCH: 34.8 pg — ABNORMAL HIGH (ref 26.0–34.0)
MCHC: 34.9 g/dL (ref 30.0–36.0)
MCV: 99.7 fL (ref 80.0–100.0)
Platelets: 159 10*3/uL (ref 150–400)
RBC: 3.71 MIL/uL — ABNORMAL LOW (ref 4.22–5.81)
RDW: 12.7 % (ref 11.5–15.5)
WBC: 5.8 10*3/uL (ref 4.0–10.5)
nRBC: 0 % (ref 0.0–0.2)

## 2019-04-25 LAB — POTASSIUM: Potassium: 2.8 mmol/L — ABNORMAL LOW (ref 3.5–5.1)

## 2019-04-25 MED ORDER — DOCUSATE SODIUM 100 MG PO CAPS: 100.0000 mg | ORAL_CAPSULE | Freq: Two times a day (BID) | ORAL | 0 refills | Status: DC | PRN

## 2019-04-25 MED ORDER — NITROFURANTOIN MONOHYD MACRO 100 MG PO CAPS: 100.0000 mg | ORAL_CAPSULE | Freq: Two times a day (BID) | ORAL | 0 refills | Status: DC

## 2019-04-25 MED ORDER — POTASSIUM CHLORIDE CRYS ER 20 MEQ PO TBCR
40.0000 meq | EXTENDED_RELEASE_TABLET | Freq: Once | ORAL | Status: AC
  Administered 2019-04-25: 40 meq via ORAL
  Filled 2019-04-25: qty 2

## 2019-04-25 MED ORDER — KCL IN DEXTROSE-NACL 40-5-0.45 MEQ/L-%-% IV SOLN
INTRAVENOUS | Status: DC
  Administered 2019-04-25: 13:00:00 via INTRAVENOUS
  Filled 2019-04-25 (×3): qty 1000

## 2019-04-25 MED ORDER — SIMETHICONE 80 MG PO CHEW
80.0000 mg | CHEWABLE_TABLET | Freq: Four times a day (QID) | ORAL | Status: DC
  Administered 2019-04-25: 80 mg via ORAL
  Filled 2019-04-25 (×4): qty 1

## 2019-04-25 NOTE — Progress Notes (Signed)
Subjective:  CC: Mark Blevins is a 78 y.o. male  Hospital stay day 1, 1 Day Post-Op sigmoid volvulus status post endoscopic decompression  HPI: No acute issues overnight.  No more pain.  Less distended.  ROS:  General: Denies weight loss, weight gain, fatigue, fevers, chills, and night sweats. Heart: Denies chest pain, palpitations, racing heart, irregular heartbeat, leg pain or swelling, and decreased activity tolerance. Respiratory: Denies breathing difficulty, shortness of breath, wheezing, cough, and sputum. GI: Denies change in appetite, heartburn, nausea, vomiting, constipation, diarrhea, and blood in stool. GU: Denies difficulty urinating, pain with urinating, urgency, frequency, blood in urine.   Objective:   Temp:  [97 F (36.1 C)-98.7 F (37.1 C)] 98.3 F (36.8 C) (06/15 0555) Pulse Rate:  [59-77] 73 (06/15 0555) Resp:  [14-20] 18 (06/15 0555) BP: (114-158)/(63-101) 122/80 (06/15 0555) SpO2:  [97 %-100 %] 99 % (06/15 0555) Weight:  [104.3 kg] 104.3 kg (06/14 1052)     Height: 6' (182.9 cm) Weight: 104.3 kg BMI (Calculated): 31.19   Intake/Output this shift:   Intake/Output Summary (Last 24 hours) at 04/25/2019 0957 Last data filed at 04/25/2019 0644 Gross per 24 hour  Intake 1574.45 ml  Output -  Net 1574.45 ml    Constitutional :  alert, cooperative, appears stated age and no distress  Respiratory:  clear to auscultation bilaterally  Cardiovascular:  regular rate and rhythm  Gastrointestinal: soft, non-tender; bowel sounds normal; no masses,  no organomegaly.  Still slightly tympanic but patient states he is at baseline  Skin: Cool and moist.   Psychiatric: Normal affect, non-agitated, not confused       LABS:  CMP Latest Ref Rng & Units 04/25/2019 04/25/2019 04/24/2019  Glucose 70 - 99 mg/dL - 87 119(H)  BUN 8 - 23 mg/dL - 14 16  Creatinine 0.61 - 1.24 mg/dL - 1.30(H) 1.20  Sodium 135 - 145 mmol/L - 139 138  Potassium 3.5 - 5.1 mmol/L 2.8(L) 2.6(LL) 2.8(L)   Chloride 98 - 111 mmol/L - 104 104  CO2 22 - 32 mmol/L - 25 22  Calcium 8.9 - 10.3 mg/dL - 8.2(L) 9.0  Total Protein 6.5 - 8.1 g/dL - - 8.0  Total Bilirubin 0.3 - 1.2 mg/dL - - 1.6(H)  Alkaline Phos 38 - 126 U/L - - 62  AST 15 - 41 U/L - - 25  ALT 0 - 44 U/L - - 18   CBC Latest Ref Rng & Units 04/25/2019 04/24/2019  WBC 4.0 - 10.5 K/uL 5.8 6.0  Hemoglobin 13.0 - 17.0 g/dL 12.9(L) 14.8  Hematocrit 39.0 - 52.0 % 37.0(L) 43.6  Platelets 150 - 400 K/uL 159 197    RADS: n/a Assessment:   sigmoid volvulus status post endoscopic decompression.  Clinically doing well.  We will start clear liquids and continue to monitor for now.  Patient is still hesitant on proceeding with surgery due to the possibility of a ostomy formation.  Patient requested that he discuss in person with his wife if possible.  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  I am agreeable with this plan.  I discussed the issue with the for manager and she is agreeable with allowing an exception to the policy for this.  Pending discussion regarding the surgery, may proceed with prep later today, or advance diet as tolerated and hopefully discharge home in the next day or 2.

## 2019-04-25 NOTE — Progress Notes (Signed)
Pt discharged per MD order. IV removed. Discharge instructions reviewed with pt. Pt verbalized understanding. All questions answered to pt satisfaction. Pt taken to car in wheelchair by staff.  ?

## 2019-04-25 NOTE — Discharge Summary (Signed)
Physician Discharge Summary  Patient ID: MCCOY TESTA MRN: [id number] DOB/AGE: Dec 13, 1940 78 y.o.  Admit date: 04/24/2019 Discharge date: 04/25/2019  Admission Diagnoses: Sigmoid volvulus  Discharge Diagnoses:  Same as above plus UTI  Discharged Condition: good  Hospital Course: Patient diagnosed with sigmoid volvulus in emergency department.  Underwent urgent decompression colonoscopy successfully.  Observation afterwards noted multiple bowel movements, resolution of pain, and decreased distention.  Patient was tolerating a clear liquid diet well without any issues.  Due to some outside obligations, patient urgently requesting to be discharged as soon as possible, stating that he will stay on clear liquids for another day or so just in case.  We also extensively discussed through multiple conversations the typical standard of care for management of sigmoid volvulus.  My initial recommendation was resection during this admission, which is the typical recommended care due to the high incidence of recurrence of this disease.  We did specifically go over the risk benefits and alternatives of said procedure.  The risk of surgery include, but not limited to, bleeding, chronic pain, post-op infxn, post-op SBO or ileus, hernias, possible ostomy placement and need for re-operation to address said risks. The risks of general anesthetic, if used, includes MI, CVA, sudden death or even reaction to anesthetic medications also discussed. Alternatives include continued observation, which again was highly not recommended.  Benefits include possible symptom relief, preventing recurrence, further decline in health and possible death.  Patient ultimately decided to postpone any immediate surgery at this time, due to concerns for outside obligations and discussion with his wife.  He is also very concerned about the need for potential ostomy.  He verbalized back multiple times that he understands the risk of  recurrence, and the potential complications of such recurrences.  He was still comfortable with this decision at the time of discharge, and has stated that once he is able to fulfill his obligations, he will consider contacting me for consideration of the surgical resection again.  Of note, urinalysis did show incidental UTI.  Further questioning of urinary symptoms revealed that he has been dealing with some urinary frequency lately.  Therefore, will treat as symptomatic UTI and prescribed Macrobid as an outpatient.   Consults: None  Discharge Exam: Blood pressure 95/67, pulse 74, temperature (!) 97.5 F (36.4 C), temperature source Oral, resp. rate 17, height 6' (1.829 m), weight 104.3 kg, SpO2 100 %. General appearance: alert, cooperative and no distress GI: soft, non-tender; bowel sounds normal; no masses,  no organomegaly  Disposition:  Discharge disposition: 01-Home or Self Care       Discharge Instructions    Discharge patient   Complete by: As directed    Discharge disposition: 01-Home or Self Care   Discharge patient date: 04/25/2019     Allergies as of 04/25/2019      Reactions   Codeine    n/v   Morphine And Related Nausea And Vomiting      Medication List    TAKE these medications   amLODipine 10 MG tablet Commonly known as: NORVASC TK 1 T PO D   aspirin 81 MG chewable tablet Chew 81 mg by mouth daily.   cloNIDine 0.1 MG tablet Commonly known as: CATAPRES TK 1 T PO D   docusate sodium 100 MG capsule Commonly known as: Colace Take 1 capsule (100 mg total) by mouth 2 (two) times daily as needed for up to 10 days for mild constipation.   labetalol 200 MG tablet Commonly known as:  NORMODYNE TK 1 T PO D   multivitamin with minerals Tabs tablet Take 1 tablet by mouth daily.   nitrofurantoin (macrocrystal-monohydrate) 100 MG capsule Commonly known as: Macrobid Take 1 capsule (100 mg total) by mouth 2 (two) times daily for 7 days.      Follow-up  Information    BristolSakai, Gladis Soley, DO Follow up.   Specialty: Surgery Why: as needed, when preparted to discuss surgery Contact information: [address] [place] [address] [phone]            Total time spent arranging discharge was >8830min. Signed: Sung Amabilesami Margeart Allender 04/25/2019, 4:11 PM

## 2019-04-27 ENCOUNTER — Ambulatory Visit
Admission: RE | Admit: 2019-04-27 | Discharge: 2019-04-27 | Disposition: A | Discharge status: Home / Self Care | Payer: Medicare Other | Source: Ambulatory Visit | Attending: Surgery | Admitting: Surgery

## 2019-04-27 ENCOUNTER — Other Ambulatory Visit: Payer: Self-pay | Admitting: Surgery

## 2019-04-27 ENCOUNTER — Encounter
Admission: AD | Disposition: A | Discharge status: Home / Self Care | Payer: Self-pay | Source: Ambulatory Visit | Attending: Surgery

## 2019-04-27 ENCOUNTER — Inpatient Hospital Stay: Payer: Medicare Other | Admitting: Certified Registered Nurse Anesthetist

## 2019-04-27 ENCOUNTER — Other Ambulatory Visit: Payer: Self-pay

## 2019-04-27 ENCOUNTER — Inpatient Hospital Stay
Admission: AD | Admit: 2019-04-27 | Discharge: 2019-05-11 | DRG: 330 | Disposition: A | Discharge status: Home Health | Payer: Medicare Other | Source: Ambulatory Visit | Attending: Surgery | Admitting: Surgery

## 2019-04-27 ENCOUNTER — Ambulatory Visit: Payer: Self-pay | Admitting: Surgery

## 2019-04-27 DIAGNOSIS — K56699 Other intestinal obstruction unspecified as to partial versus complete obstruction: Secondary | ICD-10-CM | POA: Diagnosis not present

## 2019-04-27 DIAGNOSIS — K5939 Other megacolon: Secondary | ICD-10-CM | POA: Diagnosis present

## 2019-04-27 DIAGNOSIS — K56609 Unspecified intestinal obstruction, unspecified as to partial versus complete obstruction: Secondary | ICD-10-CM | POA: Diagnosis not present

## 2019-04-27 DIAGNOSIS — I4891 Unspecified atrial fibrillation: Secondary | ICD-10-CM | POA: Diagnosis present

## 2019-04-27 DIAGNOSIS — Z0189 Encounter for other specified special examinations: Secondary | ICD-10-CM

## 2019-04-27 DIAGNOSIS — K562 Volvulus: Secondary | ICD-10-CM | POA: Diagnosis not present

## 2019-04-27 DIAGNOSIS — Z4659 Encounter for fitting and adjustment of other gastrointestinal appliance and device: Secondary | ICD-10-CM | POA: Diagnosis not present

## 2019-04-27 DIAGNOSIS — Z885 Allergy status to narcotic agent status: Secondary | ICD-10-CM | POA: Diagnosis not present

## 2019-04-27 DIAGNOSIS — E876 Hypokalemia: Secondary | ICD-10-CM | POA: Diagnosis not present

## 2019-04-27 DIAGNOSIS — K9131 Postprocedural partial intestinal obstruction: Secondary | ICD-10-CM | POA: Diagnosis not present

## 2019-04-27 DIAGNOSIS — F17220 Nicotine dependence, chewing tobacco, uncomplicated: Secondary | ICD-10-CM | POA: Diagnosis present

## 2019-04-27 DIAGNOSIS — N390 Urinary tract infection, site not specified: Secondary | ICD-10-CM | POA: Diagnosis not present

## 2019-04-27 DIAGNOSIS — K6389 Other specified diseases of intestine: Secondary | ICD-10-CM | POA: Diagnosis not present

## 2019-04-27 DIAGNOSIS — R109 Unspecified abdominal pain: Secondary | ICD-10-CM | POA: Insufficient documentation

## 2019-04-27 DIAGNOSIS — K660 Peritoneal adhesions (postprocedural) (postinfection): Secondary | ICD-10-CM | POA: Diagnosis present

## 2019-04-27 DIAGNOSIS — I10 Essential (primary) hypertension: Secondary | ICD-10-CM | POA: Diagnosis present

## 2019-04-27 DIAGNOSIS — E669 Obesity, unspecified: Secondary | ICD-10-CM | POA: Diagnosis not present

## 2019-04-27 DIAGNOSIS — Z6829 Body mass index (BMI) 29.0-29.9, adult: Secondary | ICD-10-CM | POA: Diagnosis not present

## 2019-04-27 DIAGNOSIS — K565 Intestinal adhesions [bands], unspecified as to partial versus complete obstruction: Secondary | ICD-10-CM | POA: Diagnosis not present

## 2019-04-27 DIAGNOSIS — Z452 Encounter for adjustment and management of vascular access device: Secondary | ICD-10-CM

## 2019-04-27 DIAGNOSIS — N400 Enlarged prostate without lower urinary tract symptoms: Secondary | ICD-10-CM | POA: Diagnosis present

## 2019-04-27 DIAGNOSIS — J90 Pleural effusion, not elsewhere classified: Secondary | ICD-10-CM | POA: Diagnosis not present

## 2019-04-27 DIAGNOSIS — E441 Mild protein-calorie malnutrition: Secondary | ICD-10-CM | POA: Diagnosis not present

## 2019-04-27 HISTORY — PX: COLECTOMY: SHX59

## 2019-04-27 SURGERY — COLECTOMY, TOTAL
Anesthesia: General | Site: Abdomen

## 2019-04-27 MED ORDER — ROCURONIUM BROMIDE 100 MG/10ML IV SOLN
INTRAVENOUS | Status: DC | PRN
  Administered 2019-04-27: 20 mg via INTRAVENOUS
  Administered 2019-04-27: 5 mg via INTRAVENOUS
  Administered 2019-04-27: 10 mg via INTRAVENOUS
  Administered 2019-04-27: 45 mg via INTRAVENOUS

## 2019-04-27 MED ORDER — SODIUM CHLORIDE 0.9 % IV SOLN
2.0000 g | INTRAVENOUS | Status: AC
  Administered 2019-04-27: 2 g via INTRAVENOUS
  Filled 2019-04-27: qty 20

## 2019-04-27 MED ORDER — CHLORHEXIDINE GLUCONATE CLOTH 2 % EX PADS: 6.0000 | MEDICATED_PAD | Freq: Once | CUTANEOUS | Status: DC

## 2019-04-27 MED ORDER — ACETAMINOPHEN 500 MG PO TABS: 1000.0000 mg | ORAL_TABLET | ORAL | Status: DC

## 2019-04-27 MED ORDER — BUPIVACAINE HCL (PF) 0.5 % IJ SOLN
INTRAMUSCULAR | Status: DC | PRN
  Administered 2019-04-27: 30 mL

## 2019-04-27 MED ORDER — GABAPENTIN 300 MG PO CAPS: 300.0000 mg | ORAL_CAPSULE | ORAL | Status: DC

## 2019-04-27 MED ORDER — FENTANYL CITRATE (PF) 100 MCG/2ML IJ SOLN
25.0000 ug | INTRAMUSCULAR | Status: DC | PRN
  Administered 2019-04-27 (×2): 50 ug via INTRAVENOUS

## 2019-04-27 MED ORDER — DOCUSATE SODIUM 100 MG PO CAPS: 100.0000 mg | ORAL_CAPSULE | Freq: Two times a day (BID) | ORAL | Status: DC | PRN

## 2019-04-27 MED ORDER — CLONIDINE HCL 0.1 MG PO TABS
0.1000 mg | ORAL_TABLET | Freq: Every day | ORAL | Status: DC
  Administered 2019-04-28 – 2019-05-10 (×12): 0.1 mg via ORAL
  Filled 2019-04-27 (×13): qty 1

## 2019-04-27 MED ORDER — BUPIVACAINE LIPOSOME 1.3 % IJ SUSP
INTRAMUSCULAR | Status: DC | PRN
  Administered 2019-04-27: 20 mL

## 2019-04-27 MED ORDER — ACETAMINOPHEN 650 MG RE SUPP: 650.0000 mg | Freq: Four times a day (QID) | RECTAL | Status: DC | PRN

## 2019-04-27 MED ORDER — ONDANSETRON HCL 4 MG/2ML IJ SOLN
4.0000 mg | Freq: Four times a day (QID) | INTRAMUSCULAR | Status: DC | PRN
  Administered 2019-05-05: 4 mg via INTRAVENOUS
  Filled 2019-04-27: qty 2

## 2019-04-27 MED ORDER — AMLODIPINE BESYLATE 10 MG PO TABS
10.0000 mg | ORAL_TABLET | Freq: Every day | ORAL | Status: DC
  Administered 2019-04-28 – 2019-05-11 (×10): 10 mg via ORAL
  Filled 2019-04-27 (×12): qty 1

## 2019-04-27 MED ORDER — METRONIDAZOLE IN NACL 5-0.79 MG/ML-% IV SOLN
500.0000 mg | INTRAVENOUS | Status: AC
  Administered 2019-04-27: 500 mg via INTRAVENOUS
  Filled 2019-04-27: qty 100

## 2019-04-27 MED ORDER — MEPERIDINE HCL 50 MG/ML IJ SOLN: 6.2500 mg | INTRAMUSCULAR | Status: DC | PRN

## 2019-04-27 MED ORDER — HYDROMORPHONE HCL 1 MG/ML IJ SOLN
INTRAMUSCULAR | Status: AC
  Administered 2019-04-27: 0.25 mg via INTRAVENOUS
  Filled 2019-04-27: qty 1

## 2019-04-27 MED ORDER — PROMETHAZINE HCL 25 MG/ML IJ SOLN: 6.2500 mg | INTRAMUSCULAR | Status: DC | PRN

## 2019-04-27 MED ORDER — LIDOCAINE HCL (CARDIAC) PF 100 MG/5ML IV SOSY
PREFILLED_SYRINGE | INTRAVENOUS | Status: DC | PRN
  Administered 2019-04-27: 100 mg via INTRAVENOUS

## 2019-04-27 MED ORDER — LACTATED RINGERS IV SOLN
INTRAVENOUS | Status: DC | PRN
  Administered 2019-04-27 (×2): via INTRAVENOUS

## 2019-04-27 MED ORDER — ONDANSETRON 4 MG PO TBDP: 4.0000 mg | ORAL_TABLET | Freq: Four times a day (QID) | ORAL | Status: DC | PRN

## 2019-04-27 MED ORDER — FENTANYL CITRATE (PF) 100 MCG/2ML IJ SOLN
INTRAMUSCULAR | Status: DC | PRN
  Administered 2019-04-27: 100 ug via INTRAVENOUS
  Administered 2019-04-27 (×2): 50 ug via INTRAVENOUS

## 2019-04-27 MED ORDER — PHENYLEPHRINE HCL (PRESSORS) 10 MG/ML IV SOLN
INTRAVENOUS | Status: DC | PRN
  Administered 2019-04-27 (×2): 100 ug via INTRAVENOUS

## 2019-04-27 MED ORDER — ENOXAPARIN SODIUM 40 MG/0.4ML ~~LOC~~ SOLN
40.0000 mg | SUBCUTANEOUS | Status: DC
  Administered 2019-04-28 – 2019-05-05 (×8): 40 mg via SUBCUTANEOUS
  Filled 2019-04-27 (×8): qty 0.4

## 2019-04-27 MED ORDER — LACTATED RINGERS IV SOLN
INTRAVENOUS | Status: DC
  Administered 2019-04-27: 23:00:00 via INTRAVENOUS

## 2019-04-27 MED ORDER — SUCCINYLCHOLINE CHLORIDE 20 MG/ML IJ SOLN
INTRAMUSCULAR | Status: DC | PRN
  Administered 2019-04-27: 100 mg via INTRAVENOUS

## 2019-04-27 MED ORDER — ADULT MULTIVITAMIN W/MINERALS CH
1.0000 | ORAL_TABLET | Freq: Every day | ORAL | Status: DC
  Administered 2019-04-27 – 2019-04-29 (×2): 1 via ORAL
  Filled 2019-04-27 (×5): qty 1

## 2019-04-27 MED ORDER — NITROFURANTOIN MONOHYD MACRO 100 MG PO CAPS
100.0000 mg | ORAL_CAPSULE | Freq: Two times a day (BID) | ORAL | Status: DC
  Administered 2019-04-27 – 2019-05-04 (×12): 100 mg via ORAL
  Filled 2019-04-27 (×16): qty 1

## 2019-04-27 MED ORDER — TRAMADOL HCL 50 MG PO TABS
50.0000 mg | ORAL_TABLET | Freq: Four times a day (QID) | ORAL | Status: DC | PRN
  Administered 2019-04-27 – 2019-04-29 (×4): 50 mg via ORAL
  Filled 2019-04-27 (×4): qty 1

## 2019-04-27 MED ORDER — ONDANSETRON HCL 4 MG/2ML IJ SOLN
INTRAMUSCULAR | Status: DC | PRN
  Administered 2019-04-27: 4 mg via INTRAVENOUS

## 2019-04-27 MED ORDER — SUGAMMADEX SODIUM 200 MG/2ML IV SOLN
INTRAVENOUS | Status: DC | PRN
  Administered 2019-04-27: 198.4 mg via INTRAVENOUS

## 2019-04-27 MED ORDER — EPHEDRINE SULFATE 50 MG/ML IJ SOLN
INTRAMUSCULAR | Status: DC | PRN
  Administered 2019-04-27 (×2): 10 mg via INTRAVENOUS

## 2019-04-27 MED ORDER — GLYCOPYRROLATE 0.2 MG/ML IJ SOLN
INTRAMUSCULAR | Status: DC | PRN
  Administered 2019-04-27: 0.2 mg via INTRAVENOUS

## 2019-04-27 MED ORDER — LIDOCAINE HCL 4 % MT SOLN
OROMUCOSAL | Status: DC | PRN
  Administered 2019-04-27: 4 mL via TOPICAL

## 2019-04-27 MED ORDER — LACTATED RINGERS IV SOLN
INTRAVENOUS | Status: DC | PRN
  Administered 2019-04-27: 14:00:00 via INTRAVENOUS

## 2019-04-27 MED ORDER — LABETALOL HCL 200 MG PO TABS
200.0000 mg | ORAL_TABLET | Freq: Once | ORAL | Status: AC
  Administered 2019-04-28: 200 mg via ORAL
  Filled 2019-04-27: qty 1

## 2019-04-27 MED ORDER — FENTANYL CITRATE (PF) 100 MCG/2ML IJ SOLN
INTRAMUSCULAR | Status: AC
  Filled 2019-04-27: qty 2

## 2019-04-27 MED ORDER — HYDROMORPHONE HCL 1 MG/ML IJ SOLN
0.2500 mg | INTRAMUSCULAR | Status: DC | PRN
  Administered 2019-04-27 (×5): 0.25 mg via INTRAVENOUS

## 2019-04-27 MED ORDER — PROPOFOL 10 MG/ML IV BOLUS
INTRAVENOUS | Status: DC | PRN
  Administered 2019-04-27: 140 mg via INTRAVENOUS

## 2019-04-27 MED ORDER — SODIUM CHLORIDE 0.9 % IJ SOLN
INTRAMUSCULAR | Status: DC | PRN
  Administered 2019-04-27: 50 mL

## 2019-04-27 MED ORDER — DEXAMETHASONE SODIUM PHOSPHATE 10 MG/ML IJ SOLN
INTRAMUSCULAR | Status: DC | PRN
  Administered 2019-04-27: 10 mg via INTRAVENOUS

## 2019-04-27 MED ORDER — CELECOXIB 200 MG PO CAPS: 200.0000 mg | ORAL_CAPSULE | ORAL | Status: DC

## 2019-04-27 MED ORDER — ACETAMINOPHEN 325 MG PO TABS: 650.0000 mg | ORAL_TABLET | Freq: Four times a day (QID) | ORAL | Status: DC | PRN

## 2019-04-27 MED ORDER — ACETAMINOPHEN 10 MG/ML IV SOLN
INTRAVENOUS | Status: DC | PRN
  Administered 2019-04-27: 1000 mg via INTRAVENOUS

## 2019-04-27 SURGICAL SUPPLY — 53 items
CANISTER SUCT 1200ML W/VALVE (MISCELLANEOUS) ×2 IMPLANT
CELL SAVER LIPIGURD (MISCELLANEOUS) IMPLANT
CHLORAPREP W/TINT 26 (MISCELLANEOUS) ×2 IMPLANT
COVER WAND RF STERILE (DRAPES) ×2 IMPLANT
DRAIN CHANNEL JP 15F RND 16 (MISCELLANEOUS) IMPLANT
DRAPE LAPAROTOMY 100X77 ABD (DRAPES) ×2 IMPLANT
DRSG OPSITE POSTOP 4X10 (GAUZE/BANDAGES/DRESSINGS) IMPLANT
DRSG OPSITE POSTOP 4X8 (GAUZE/BANDAGES/DRESSINGS) IMPLANT
DRSG TEGADERM 4X10 (GAUZE/BANDAGES/DRESSINGS) IMPLANT
DRSG TELFA 3X8 NADH (GAUZE/BANDAGES/DRESSINGS) IMPLANT
ELECT BLADE 6.5 EXT (BLADE) ×2 IMPLANT
ELECT CAUTERY BLADE 6.4 (BLADE) ×2 IMPLANT
ELECT REM PT RETURN 9FT ADLT (ELECTROSURGICAL) ×2
ELECTRODE REM PT RTRN 9FT ADLT (ELECTROSURGICAL) ×1 IMPLANT
EXTRT SYSTEM ALEXIS 14CM (MISCELLANEOUS)
EXTRT SYSTEM ALEXIS 17CM (MISCELLANEOUS)
GLOVE BIOGEL PI IND STRL 7.0 (GLOVE) ×3 IMPLANT
GLOVE BIOGEL PI INDICATOR 7.0 (GLOVE) ×3
GLOVE SURG SYN 6.5 ES PF (GLOVE) ×6 IMPLANT
GOWN STRL REUS W/ TWL LRG LVL3 (GOWN DISPOSABLE) ×4 IMPLANT
GOWN STRL REUS W/TWL LRG LVL3 (GOWN DISPOSABLE) ×4
HANDLE SUCTION POOLE (INSTRUMENTS) ×1 IMPLANT
HOLDER FOLEY CATH W/STRAP (MISCELLANEOUS) ×2 IMPLANT
KIT TURNOVER KIT A (KITS) ×2 IMPLANT
LABEL OR SOLS (LABEL) ×2 IMPLANT
LIGASURE IMPACT 36 18CM CVD LR (INSTRUMENTS) ×2 IMPLANT
LIGHT WAVEGUIDE WIDE FLAT (MISCELLANEOUS) ×2 IMPLANT
NEEDLE HYPO 22GX1.5 SAFETY (NEEDLE) ×2 IMPLANT
NS IRRIG 1000ML POUR BTL (IV SOLUTION) ×2 IMPLANT
PACK BASIN MAJOR ARMC (MISCELLANEOUS) ×2 IMPLANT
PACK COLON CLEAN CLOSURE (MISCELLANEOUS) ×2 IMPLANT
RELOAD PROXIMATE 75MM BLUE (ENDOMECHANICALS) ×6 IMPLANT
RETRACTOR WND ALEXIS-O 25 LRG (MISCELLANEOUS) ×1 IMPLANT
RTRCTR WOUND ALEXIS O 25CM LRG (MISCELLANEOUS) ×2
SOL PREP PVP 2OZ (MISCELLANEOUS) ×2
SOLUTION PREP PVP 2OZ (MISCELLANEOUS) ×1 IMPLANT
SPONGE LAP 18X18 RF (DISPOSABLE) IMPLANT
STAPLER PROXIMATE 75MM BLUE (STAPLE) ×2 IMPLANT
STAPLER SKIN PROX 35W (STAPLE) ×2 IMPLANT
SUCTION POOLE HANDLE (INSTRUMENTS) ×2
SURGILUBE 2OZ TUBE FLIPTOP (MISCELLANEOUS) IMPLANT
SUT PDS AB 1 TP1 54 (SUTURE) IMPLANT
SUT SILK 2 0 (SUTURE)
SUT SILK 2-0 18XBRD TIE 12 (SUTURE) IMPLANT
SUT SILK 3 0 (SUTURE)
SUT SILK 3-0 (SUTURE) ×2 IMPLANT
SUT SILK 3-0 18XBRD TIE 12 (SUTURE) IMPLANT
SUT VIC AB 3-0 SH 27 (SUTURE)
SUT VIC AB 3-0 SH 27X BRD (SUTURE) IMPLANT
SYR 20CC LL (SYRINGE) ×2 IMPLANT
SYSTEM CONTND EXTRCTN KII BLLN (MISCELLANEOUS) IMPLANT
TOWEL OR 17X26 4PK STRL BLUE (TOWEL DISPOSABLE) ×2 IMPLANT
TRAY FOLEY SLVR 16FR LF STAT (SET/KITS/TRAYS/PACK) ×2 IMPLANT

## 2019-04-27 NOTE — H&P (Signed)
Subjective:   CC: Sigmoid volvulus  HPI:  Mark Blevins is a 78 y.o. male admitted for issue above.  Called office today complaining of increasing distention and pain again.  Started a day ago, not feeling hungry and no BM since yesterday.  Outpt KUB noted recurrent volvulus.  Pt urgently admitted in preparation for surgery.   Past Medical History:  has a past medical history of BPH (benign prostatic hyperplasia), Hypertension, and Venous reflux.  Past Surgical History: appy  Family History: Reviewed and not relevant to chief complaint  Social History:  reports that he has quit smoking. His smokeless tobacco use includes chew. He reports current alcohol use. He reports that he does not use drugs.  Current Medications:  amLODipine (NORVASC) 10 MG tablet TK 1 T PO D [provider] Needs Review  cloNIDine (CATAPRES) 0.1 MG tablet TK 1 T PO D [provider] Needs Review  labetalol (NORMODYNE) 200 MG tablet TK 1 T PO D [provider] Needs Review   As well as baby aspirin  Allergies:  Allergies as of 04/27/2019 - Review Complete 04/24/2019  Allergen Reaction Noted  . Codeine  04/24/2019  . Morphine and related Nausea And Vomiting 04/24/2019    ROS:  General: Denies weight loss, weight gain, fatigue, fevers, chills, and night sweats. Eyes: Denies blurry vision, double vision, eye pain, itchy eyes, and tearing. Ears: Denies hearing loss, earache, and ringing in ears. Nose: Denies sinus pain, congestion, infections, runny nose, and nosebleeds. Mouth/throat: Denies hoarseness, sore throat, bleeding gums, and difficulty swallowing. Heart: Denies chest pain, palpitations, racing heart, irregular heartbeat, leg pain or swelling, and decreased activity tolerance. Respiratory: Denies breathing difficulty, shortness of breath, wheezing, cough, and sputum. GI: Denies diarrhea, and blood in stool. GU: Denies difficulty urinating, pain with urinating, urgency,  frequency, blood in urine. Musculoskeletal: Denies joint stiffness, pain, swelling, muscle weakness. Skin: Denies rash, itching, mass, tumors, sores, and boils Neurologic: Denies headache, fainting, dizziness, seizures, numbness, and tingling. Psychiatric: Denies depression, anxiety, difficulty sleeping, and memory loss. Endocrine: Denies heat or cold intolerance, and increased thirst or urination. Blood/lymph: Denies easy bruising, easy bruising, and swollen glands     Objective:     BP (!) 144/91 (BP Location: Right Arm)   Pulse 83   Temp 97.9 F (36.6 C) (Oral)   Resp 16   SpO2 100%   Constitutional :  alert, cooperative, appears stated age and no distress  Lymphatics/Throat:  no asymmetry, masses, or scars  Respiratory:  clear to auscultation bilaterally  Cardiovascular:  regular rate and rhythm  Gastrointestinal: distended, tender all four quadrants.   Musculoskeletal: Steady gait and movement  Skin: Cool and moist, surgical scars   Psychiatric: Normal affect, non-agitated, not confused       LABS:   RADS: CLINICAL DATA:  Recurrent abdominal pain. Recently treated sigmoid volvulus.  EXAM: ABDOMEN - 2 VIEW  COMPARISON:  CT abdomen and pelvis 04/24/2019  FINDINGS: There is a markedly dilated loop of colon containing an air-fluid level in the central and left upper abdomen which is similar in appearance to the recent CT which demonstrated sigmoid volvulus. A moderate amount of stool is present elsewhere in the colon. No definite small bowel dilatation or definite intraperitoneal free air is identified. Thoracolumbar spondylosis is noted. The visualized lung bases are grossly clear.  IMPRESSION: Findings of recurrent sigmoid volvulus.  These results will be called to the ordering clinician or representative by the Radiologist Assistant, and communication documented in  the PACS or zVision Dashboard.   Electronically Signed   By: Sebastian AcheAllen  Grady M.D.    On: 04/27/2019 11:00  Assessment:   Acute recurrent sigmoid volvulus  Plan:   Discussed pathophisiology and treatment options again including endoscopic decompression and eventual sigmoidectomy, possible ostomy.  Since this was such an early recurrence, I did not recommend a repeat colonoscopy and recommended surgical resection, understanding the fact that there is a higher chance for ostomy creation due to urgent nature of procedure.  The risk of surgery include, but not limited to, recurrence, bleeding, chronic pain, post-op infxn, post-op SBO or ileus, hernias, resection of bowel, re-anastamosis, possible ostomy placement and need for re-operation to address said risks. The risks of general anesthetic, if used, includes MI, CVA, sudden death or even reaction to anesthetic medications also discussed. Alternatives include continued observation and NG decompression.  Benefits include possible symptom relief, preventing further decline in health and possible death.  Typical post-op recovery time of additional days in hospital for observation afterwards also discussed.  The patient verbalized understanding and all questions were answered to the patient's satisfaction.  He is agreeable now to proceed with surgical resection.  NG will be placed prior to surgery to minimize aspiration risk.  Will be taken urgently to surgery.  COVID testing not require since last performed three days ago andhe stated he has been self-quarantined since discharge from hospital   Call from patient received at 0920.  KUB completed by 1030 and patient called by 1040 of results and recommended surgery.

## 2019-04-27 NOTE — Anesthesia Post-op Follow-up Note (Signed)
Anesthesia QCDR form completed.        

## 2019-04-27 NOTE — Anesthesia Preprocedure Evaluation (Addendum)
Anesthesia Evaluation  Patient identified by MRN, date of birth, ID band Patient awake    Reviewed: Allergy & Precautions, NPO status , Patient's Chart, lab work & pertinent test results  History of Anesthesia Complications Negative for: history of anesthetic complications  Airway Mallampati: III  TM Distance: >3 FB Neck ROM: Full    Dental  (+) Edentulous Upper, Edentulous Lower   Pulmonary neg sleep apnea, neg COPD, former smoker,    breath sounds clear to auscultation- rhonchi (-) wheezing      Cardiovascular hypertension, Pt. on medications (-) CAD, (-) Past MI, (-) Cardiac Stents and (-) CABG  Rhythm:Regular Rate:Normal - Systolic murmurs and - Diastolic murmurs    Neuro/Psych neg Seizures negative neurological ROS  negative psych ROS   GI/Hepatic Neg liver ROS, Recurrent sigmoid volvulus   Endo/Other  negative endocrine ROSneg diabetes  Renal/GU Renal InsufficiencyRenal disease     Musculoskeletal negative musculoskeletal ROS (+)   Abdominal (+) - obese,   Peds  Hematology negative hematology ROS (+)   Anesthesia Other Findings Past Medical History: No date: BPH (benign prostatic hyperplasia) No date: Hypertension No date: Venous reflux   Reproductive/Obstetrics                            Anesthesia Physical Anesthesia Plan  ASA: III  Anesthesia Plan: General   Post-op Pain Management:    Induction: Intravenous, Rapid sequence and Cricoid pressure planned  PONV Risk Score and Plan: 1 and Ondansetron  Airway Management Planned: Oral ETT  Additional Equipment:   Intra-op Plan:   Post-operative Plan: Extubation in OR  Informed Consent: I have reviewed the patients History and Physical, chart, labs and discussed the procedure including the risks, benefits and alternatives for the proposed anesthesia with the patient or authorized representative who has indicated his/her  understanding and acceptance.     Dental advisory given  Plan Discussed with: CRNA and Anesthesiologist  Anesthesia Plan Comments:        Anesthesia Quick Evaluation

## 2019-04-27 NOTE — Transfer of Care (Signed)
Immediate Anesthesia Transfer of Care Note  Patient: Mark Blevins  Procedure(s) Performed: COLECTOMY WITH COLOSTOMY (N/A Abdomen)  Patient Location: PACU  Anesthesia Type:General  Level of Consciousness: awake and sedated  Airway & Oxygen Therapy: Patient Spontanous Breathing and Patient connected to face mask oxygen  Post-op Assessment: Report given to RN and Post -op Vital signs reviewed and stable  Post vital signs: Reviewed and stable  Last Vitals:  Vitals Value Taken Time  BP 140/88 04/27/19 1722  Temp    Pulse 77 04/27/19 1720  Resp 11 04/27/19 1724  SpO2 100 % 04/27/19 1720  Vitals shown include unvalidated device data.  Last Pain:  Vitals:   04/27/19 1237  TempSrc: Oral         Complications: No apparent anesthesia complications

## 2019-04-27 NOTE — Anesthesia Postprocedure Evaluation (Signed)
Anesthesia Post Note  Patient: Mark Blevins  Procedure(s) Performed: COLECTOMY WITH COLOSTOMY (N/A Abdomen)  Patient location during evaluation: PACU Anesthesia Type: General Level of consciousness: awake and alert Pain management: pain level controlled Vital Signs Assessment: post-procedure vital signs reviewed and stable Respiratory status: spontaneous breathing, nonlabored ventilation and respiratory function stable Cardiovascular status: blood pressure returned to baseline and stable Postop Assessment: no apparent nausea or vomiting Anesthetic complications: no     Last Vitals:  Vitals:   04/27/19 1853 04/27/19 1938  BP: 129/79 122/72  Pulse: 70 78  Resp: 17 16  Temp: (!) 36.2 C 36.4 C  SpO2: 97% 100%    Last Pain:  Vitals:   04/27/19 1940  TempSrc:   PainSc: [place]

## 2019-04-27 NOTE — Anesthesia Procedure Notes (Signed)
Procedure Name: Intubation Date/Time: 04/27/2019 1:52 PM Performed by: Eben Burow, CRNA Pre-anesthesia Checklist: Patient identified, Emergency Drugs available, Suction available and Patient being monitored Patient Re-evaluated:Patient Re-evaluated prior to induction Oxygen Delivery Method: Circle system utilized Preoxygenation: Pre-oxygenation with 100% oxygen Induction Type: IV induction Laryngoscope Size: Miller and 2 Grade View: Grade I Tube type: Oral Tube size: 8.0 mm Number of attempts: 1 Airway Equipment and Method: Stylet and LTA kit utilized Placement Confirmation: ETT inserted through vocal cords under direct vision,  positive ETCO2 and breath sounds checked- equal and bilateral Secured at: 22 cm Tube secured with: Tape Dental Injury: Teeth and Oropharynx as per pre-operative assessment

## 2019-04-28 ENCOUNTER — Encounter: Payer: Self-pay | Admitting: Surgery

## 2019-04-28 LAB — BASIC METABOLIC PANEL
Anion gap: 11 (ref 5–15)
Anion gap: 12 (ref 5–15)
BUN: 14 mg/dL (ref 8–23)
BUN: 16 mg/dL (ref 8–23)
CO2: 23 mmol/L (ref 22–32)
CO2: 24 mmol/L (ref 22–32)
Calcium: 8.2 mg/dL — ABNORMAL LOW (ref 8.9–10.3)
Calcium: 8.7 mg/dL — ABNORMAL LOW (ref 8.9–10.3)
Chloride: 102 mmol/L (ref 98–111)
Chloride: 100 mmol/L (ref 98–111)
Creatinine, Ser: 1.22 mg/dL (ref 0.61–1.24)
Creatinine, Ser: 1.31 mg/dL — ABNORMAL HIGH (ref 0.61–1.24)
GFR calc Af Amer: >60 mL/min (ref >60)
GFR calc Af Amer: 60 mL/min — ABNORMAL LOW (ref >60)
GFR calc non Af Amer: 56 mL/min — ABNORMAL LOW (ref >60)
GFR calc non Af Amer: 51 mL/min — ABNORMAL LOW (ref >60)
Glucose, Bld: 151 mg/dL — ABNORMAL HIGH (ref 70–99)
Glucose, Bld: 125 mg/dL — ABNORMAL HIGH (ref 70–99)
Potassium: 2.7 mmol/L — CL (ref 3.5–5.1)
Potassium: 3.4 mmol/L — ABNORMAL LOW (ref 3.5–5.1)
Sodium: 136 mmol/L (ref 135–145)
Sodium: 136 mmol/L (ref 135–145)

## 2019-04-28 LAB — CBC
HCT: 36.8 % — ABNORMAL LOW (ref 39.0–52.0)
Hemoglobin: 12.8 g/dL — ABNORMAL LOW (ref 13.0–17.0)
MCH: 33.7 pg (ref 26.0–34.0)
MCHC: 34.8 g/dL (ref 30.0–36.0)
MCV: 96.8 fL (ref 80.0–100.0)
Platelets: 176 10*3/uL (ref 150–400)
RBC: 3.8 MIL/uL — ABNORMAL LOW (ref 4.22–5.81)
RDW: 12.4 % (ref 11.5–15.5)
WBC: 9 10*3/uL (ref 4.0–10.5)
nRBC: 0 % (ref 0.0–0.2)

## 2019-04-28 MED ORDER — POTASSIUM CHLORIDE CRYS ER 20 MEQ PO TBCR: 40.0000 meq | EXTENDED_RELEASE_TABLET | Freq: Once | ORAL | Status: DC

## 2019-04-28 MED ORDER — KCL IN DEXTROSE-NACL 40-5-0.45 MEQ/L-%-% IV SOLN
INTRAVENOUS | Status: DC
  Administered 2019-04-28 – 2019-05-05 (×12): via INTRAVENOUS
  Filled 2019-04-28 (×14): qty 1000

## 2019-04-28 MED ORDER — POTASSIUM CHLORIDE 10 MEQ/100ML IV SOLN
10.0000 meq | INTRAVENOUS | Status: AC
  Administered 2019-04-28 (×4): 10 meq via INTRAVENOUS
  Filled 2019-04-28 (×4): qty 100

## 2019-04-28 MED ORDER — POTASSIUM CHLORIDE CRYS ER 20 MEQ PO TBCR
40.0000 meq | EXTENDED_RELEASE_TABLET | Freq: Once | ORAL | Status: AC
  Administered 2019-04-28: 40 meq via ORAL
  Filled 2019-04-28: qty 2

## 2019-04-28 NOTE — Op Note (Addendum)
Preoperative diagnosis: Recurrent sigmoid volvulus  Postoperative diagnosis: Same  Procedure: Exploratory laparatomy, sigmoidectomy, decompressive sigmoidoscopy ,and end colostomy formation  Anesthesia: GETA  Surgeon: Sung AmabileIsami Mamadou Breon, DO Assistant: Hazle Quantintron-Diaz for exposure.  Wound Classification: Clean contaminated  Specimen: Sigmoid colon  Complications: None apparent  EBL: 150 mL  Indications:  Patient is a 78 y.o. male with recurrent sigmoid volvulus.  Extensive discussion was had during his previous admission for the same regarding elective resection to prevent recurrence.  At that time patient did finally decide to postpone any surgeries.  Unfortunately his recurrence came back within a few days.  This time around I did recommend straight surgery since the recurrence was so soon, and a decompressive colonoscopy again at this point likely will not yield any benefit.  Patient was agreeable to surgery at this time understanding the fact that he ostomy creation is could be likely.  Description of procedure:  The patient was placed in the supine position and general endotracheal anesthesia was induced. A time-out was completed verifying correct patient, procedure, site, positioning, and implant(s) and/or special equipment prior to beginning this procedure. Preoperative antibiotics were continued from floor. The abdomen was prepped and draped in the usual sterile fashion. A vertical midline incision was made from infraumbilical area to slightly superior to the umbilicus. This was deepened through the subcutaneous tissues and hemostasis was achieved with electrocautery. The linea alba was identified and incised and the peritoneal cavity entered. The abdomen was explored.  Very dilated sigmoid colon was immediately apparent.  Inspection of the mesentery remaining abdominal cavity did not yield any other obvious pathology.  Initial inspection noted the severe dilation extend down to the rectum and  beyond the peritoneal reflection.  Points of transection were selected proximally at descending colon where the caliber of the colon started to taper with within normal limits.  Colon was transected at this point with a blue stapler.  Staying along the dilated colon wall as much as possible the mesentery was serially ligated using LigaSure.  This was carried down towards the peritoneal reflection, and beyond that with additional dissection of the surrounding peritoneum to try to find and or palpate a normal caliber rectum.  This was not possible despite extensive dissection through the peritoneal  lining.   At this point decision was made to proceed with a decompressive sigmoidoscopy to see if there was any benefit of decompressing the severely dilated rectum.  A rigid sigmoidoscope was prepped and inserted through the anus outside the operative field.  Immediate return of clear fluid was noted with some passage of flatus.  With the help of Dr. Maia Planintron, further attempt to decompress the rectum was attempted, however despite moderate amount of gas and additional fluid drained from the rectum, the walls themselves did not collapse significantly.  The dilation was still significant enough where attempting a primary anastomosis at this point likely would have caused staple line failure.  Therefore, decision was made at this point to proceed with a end colostomy.  Transection point of the distal sigmoid was found and the colon was divided with a blue stapler again.  The severely distended sigmoid colon was then passed off operative field pending pathology.  The distal rectal stump staple line was reinforced in a Lembert fashion with a running 3-0 silk stitch.  The stump was then tagged with a 2-0 Prolene.  One last exploration of the abdomen again noted no other obvious pathology.  NG tube was attempted to be placed Intra-Op but was unable  to be passed beyond the pharynx area.  The colostomy site that was  previously marked was incised using electrocautery in a circular fashion, down to the fascia.  A cruciate incision was then made to create the ostomy opening in the fascia.  The proximally transected portion of the colon was then easily brought through the ostomy site.  A clean closure protocol initiated and the midline incision fascia was closed with running suture of PDS 0 x2 after infusion of Exparel to the area. The skin was closed with skin staples and dressed with provena incisional vac.  The colostomy was matured with multiple interrupted sutures of 3-0 Vicryl.  Bleeding was controlled with electrocautery and suture ligation.  An ostomy bag was applied.   The patient tolerated the procedure well, extubated, and transferred to PACU in stable condition, Foley remaining in place.  Sponge count and instrument count all correct at end of procedure.

## 2019-04-28 NOTE — Progress Notes (Signed)
Subjective:  CC: Mark Blevins is a 78 y.o. male  Hospital stay day 1, 1 Day Post-Op open sigmoid colectomy with end colostomy secondary to recurrent sigmoid volvulus  HPI: No issues overnight.  States he is sore around incision site.  ROS:  General: Denies weight loss, weight gain, fatigue, fevers, chills, and night sweats. Heart: Denies chest pain, palpitations, racing heart, irregular heartbeat, leg pain or swelling, and decreased activity tolerance. Respiratory: Denies breathing difficulty, shortness of breath, wheezing, cough, and sputum. GI: Denies change in appetite, heartburn, nausea, vomiting, constipation, diarrhea, and blood in stool. GU: Denies difficulty urinating, pain with urinating, urgency, frequency, blood in urine.   Objective:   Temp:  [96.5 F (35.8 C)-98.8 F (37.1 C)] 98.5 F (36.9 C) (06/18 1324) Pulse Rate:  [66-78] 66 (06/18 1324) Resp:  [8-18] 16 (06/18 1324) BP: (112-149)/(68-88) 148/84 (06/18 1324) SpO2:  [96 %-100 %] 100 % (06/18 1324)     Height: 6' (182.9 cm) Weight: 99.2 kg BMI (Calculated): 29.67   Intake/Output this shift:   Intake/Output Summary (Last 24 hours) at 04/28/2019 1518 Last data filed at 04/28/2019 0539 Gross per 24 hour  Intake 1788.6 ml  Output 515 ml  Net 1273.6 ml    Constitutional :  alert, cooperative, appears stated age and no distress  Respiratory:  clear to auscultation bilaterally  Cardiovascular:  regular rate and rhythm  Gastrointestinal: Soft, no guarding, increased distention compared to postop.  Ostomy patent with minimal bloody output.  Midline Praveena wound VAC intact..   JP with serosanguineous drainage  Skin: Cool and moist.   Psychiatric: Normal affect, non-agitated, not confused       LABS:  CMP Latest Ref Rng & Units 04/28/2019 04/25/2019 04/25/2019  Glucose 70 - 99 mg/dL 151(H) - 87  BUN 8 - 23 mg/dL 14 - 14  Creatinine 0.61 - 1.24 mg/dL 1.22 - 1.30(H)  Sodium 135 - 145 mmol/L 136 - 139  Potassium 3.5  - 5.1 mmol/L 2.7(LL) 2.8(L) 2.6(LL)  Chloride 98 - 111 mmol/L 102 - 104  CO2 22 - 32 mmol/L 23 - 25  Calcium 8.9 - 10.3 mg/dL 8.2(L) - 8.2(L)  Total Protein 6.5 - 8.1 g/dL - - -  Total Bilirubin 0.3 - 1.2 mg/dL - - -  Alkaline Phos 38 - 126 U/L - - -  AST 15 - 41 U/L - - -  ALT 0 - 44 U/L - - -   CBC Latest Ref Rng & Units 04/28/2019 04/25/2019 04/24/2019  WBC 4.0 - 10.5 K/uL 9.0 5.8 6.0  Hemoglobin 13.0 - 17.0 g/dL 12.8(L) 12.9(L) 14.8  Hematocrit 39.0 - 52.0 % 36.8(L) 37.0(L) 43.6  Platelets 150 - 400 K/uL 176 159 197    RADS: n/a Assessment:    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  Likely has postoperative ileus based on increased distention on exam today.  Continue n.p.o.  Patient currently denies any nausea but will have to monitor closely.  Will attempt NG tube if nausea symptoms appear since it was a difficult and eventual unsuccessful insertion Intra-Op.. We will continue Foley for 1 more day for strict intake and output Wound ostomy nurse consulted. appreciate follow-up and education  UTI- continue home antibiotics.  Hypokalemia-repleted via IV and p.o. supplements.  We will recheck this a.m. and continue to trend.

## 2019-04-28 NOTE — Consult Note (Signed)
[place] Nurse ostomy consult note Stoma type/location: RLQ, end colostomy S/P volvulus 04/27/19  Stomal assessment/size: aprox. 1 3/8" visualized through pouch, pink, slightly dark  Peristomal assessment: NA, surgery less than 23 hours ago Treatment options for stomal/peristomal skin: NA Output none Ostomy pouching: 2pc. 2 1/4" in place from the OR Education provided:  Explained role of ostomy nurse and creation of stoma  He lives with his elderly wife at home. We discussed her assistance with his care and he does wish for her to learn to assist him. May be allowed to have wife come in for educational session Monday. Will attempt to arrange with wife Monday.   Will need HHRN for NPWT change if I can verify if it is subcutaneous or incisional with surgeon.  Ostomy pouch change and education will need to occur with NPWT dressing changes because ostomy pouch is over 1/2 the way over the NPWT dressing from the OR.    Enrolled patient in [employer] program: No  WOC Nurse will follow along with you for continued support with ostomy teaching and care [place] MSN, [place], [place], [place], [place]

## 2019-04-29 ENCOUNTER — Inpatient Hospital Stay: Payer: Medicare Other

## 2019-04-29 LAB — BASIC METABOLIC PANEL
Anion gap: 10 (ref 5–15)
BUN: 17 mg/dL (ref 8–23)
CO2: 26 mmol/L (ref 22–32)
Calcium: 8.3 mg/dL — ABNORMAL LOW (ref 8.9–10.3)
Chloride: 99 mmol/L (ref 98–111)
Creatinine, Ser: 1.12 mg/dL (ref 0.61–1.24)
GFR calc Af Amer: >60 mL/min (ref >60)
GFR calc non Af Amer: >60 mL/min (ref >60)
Glucose, Bld: 166 mg/dL — ABNORMAL HIGH (ref 70–99)
Potassium: 3.4 mmol/L — ABNORMAL LOW (ref 3.5–5.1)
Sodium: 135 mmol/L (ref 135–145)

## 2019-04-29 LAB — CBC
HCT: 42.9 % (ref 39.0–52.0)
Hemoglobin: 14.8 g/dL (ref 13.0–17.0)
MCH: 33.6 pg (ref 26.0–34.0)
MCHC: 34.5 g/dL (ref 30.0–36.0)
MCV: 97.5 fL (ref 80.0–100.0)
Platelets: 191 10*3/uL (ref 150–400)
RBC: 4.4 MIL/uL (ref 4.22–5.81)
RDW: 12.4 % (ref 11.5–15.5)
WBC: 9.2 10*3/uL (ref 4.0–10.5)
nRBC: 0 % (ref 0.0–0.2)

## 2019-04-29 LAB — SURGICAL PATHOLOGY

## 2019-04-29 MED ORDER — POTASSIUM CHLORIDE CRYS ER 20 MEQ PO TBCR
20.0000 meq | EXTENDED_RELEASE_TABLET | ORAL | Status: AC
  Administered 2019-04-29: 10:00:00 20 meq via ORAL
  Filled 2019-04-29: qty 1

## 2019-04-29 MED ORDER — KETOROLAC TROMETHAMINE 15 MG/ML IJ SOLN
15.0000 mg | Freq: Three times a day (TID) | INTRAMUSCULAR | Status: DC | PRN
  Administered 2019-04-29: 15 mg via INTRAVENOUS
  Filled 2019-04-29: qty 1

## 2019-04-29 NOTE — Care Management Important Message (Signed)
Important Message  Patient Details  Name: Mark Blevins MRN: [id number] Date of Birth: Oct 06, 1941   Medicare Important Message Given:  Yes     Dannette Barbara 04/29/2019, 10:57 AM

## 2019-04-29 NOTE — Consult Note (Signed)
[place] Nurse ostomy follow up Stoma type/location: RLQ end colostomy.  Blood tinged liquid in pouch only  Patient with ileus and NG tube to be placed today.  Prevena in place and ostomy pouch overlaps this.  Wife to be present Monday for pouch change.  WIll hold off on pouch change until Monday.  Loss of Prevena seal is likely with ostomy pouch change.  Will wait until Monday for this reason.  Stomal assessment/size: 1 3/8" rubrous and moist  Peristomal assessment: Pouch intact.  NPWT and ostomy pouch are overlapping Treatment options for stomal/peristomal skin: Will add barrier ring and 2 1/4" pouch MOnday with wife present for teaching.  Output Blood tinged liquid only.  NG tube to be placed today.  Ostomy pouching: 2pc.  2 1/4" pouch Education provided: written materials are present.  Patient is nauseous and uncomfortable. Has questions about NG tube.  Explained procedure and rationale for NG tube.   Midline NPWT abdominal dressing in place.  Nonremovable incisional dressing.  Enrolled patient in [place] Start Discharge program: No Will Monday [employer] team will follow. Domenic Moras MSN, RN, FNP-BC [facility] Wound, Ostomy, Continence Nurse Pager [phone]

## 2019-04-29 NOTE — Progress Notes (Signed)
Subjective:  CC: Mark Blevins is a 78 y.o. male  Hospital stay day 2, 2 Days Post-Op open sigmoid colectomy with end colostomy secondary to recurrent sigmoid volvulus  HPI: No issues overnight.  Little nausea with oral meds.  Still uncomfortable   ROS:  General: Denies weight loss, weight gain, fatigue, fevers, chills, and night sweats. Heart: Denies chest pain, palpitations, racing heart, irregular heartbeat, leg pain or swelling, and decreased activity tolerance. Respiratory: Denies breathing difficulty, shortness of breath, wheezing, cough, and sputum. GI: Denies change in appetite, heartburn, nausea, vomiting, constipation, diarrhea, and blood in stool. GU: Denies difficulty urinating, pain with urinating, urgency, frequency, blood in urine.   Objective:   Temp:  [97.9 F (36.6 C)-98.5 F (36.9 C)] 97.9 F (36.6 C) (06/19 1601) Pulse Rate:  [66-89] 89 (06/19 0638) Resp:  [16-20] 20 (06/19 0638) BP: (146-158)/(84-90) 158/84 (06/19 0638) SpO2:  [95 %-100 %] 98 % (06/19 0932)     Height: 6' (182.9 cm) Weight: 99.2 kg BMI (Calculated): 29.67   Intake/Output this shift:   Intake/Output Summary (Last 24 hours) at 04/29/2019 0906 Last data filed at 04/29/2019 3557 Gross per 24 hour  Intake 1166.58 ml  Output 1430 ml  Net -263.42 ml    Constitutional :  alert, cooperative, appears stated age and no distress  Respiratory:  clear to auscultation bilaterally  Cardiovascular:  regular rate and rhythm  Gastrointestinal: Soft, no guarding, increased distention again compared to postop.  Ostomy patent with minimal bloody output.  Midline Praveena wound VAC intact..   JP with serosanguineous drainage, no further bleeding around sit.  Skin: Cool and moist.   Psychiatric: Normal affect, non-agitated, not confused       LABS:  CMP Latest Ref Rng & Units 04/29/2019 04/28/2019 04/28/2019  Glucose 70 - 99 mg/dL 166(H) 125(H) 151(H)  BUN 8 - 23 mg/dL 17 16 14   Creatinine 0.61 - 1.24 mg/dL  1.12 1.31(H) 1.22  Sodium 135 - 145 mmol/L 135 136 136  Potassium 3.5 - 5.1 mmol/L 3.4(L) 3.4(L) 2.7(LL)  Chloride 98 - 111 mmol/L 99 100 102  CO2 22 - 32 mmol/L 26 24 23   Calcium 8.9 - 10.3 mg/dL 8.3(L) 8.7(L) 8.2(L)  Total Protein 6.5 - 8.1 g/dL - - -  Total Bilirubin 0.3 - 1.2 mg/dL - - -  Alkaline Phos 38 - 126 U/L - - -  AST 15 - 41 U/L - - -  ALT 0 - 44 U/L - - -   CBC Latest Ref Rng & Units 04/29/2019 04/28/2019 04/25/2019  WBC 4.0 - 10.5 K/uL 9.2 9.0 5.8  Hemoglobin 13.0 - 17.0 g/dL 14.8 12.8(L) 12.9(L)  Hematocrit 39.0 - 52.0 % 42.9 36.8(L) 37.0(L)  Platelets 150 - 400 K/uL 191 176 159    RADS: n/a Assessment:    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  Likely has postoperative ileus based on increased distention on exam today.  Continue n.p.o.    NG today due to worsening distention. Will add toradol now to pain regimen since hgb stable and no sign of obvious bleeding today.  Foley out due to adequate I&O.  Wound ostomy nurse consulted. appreciate follow-up and education  UTI- continue home antibiotics.  Hypokalemia-repleted via IV and p.o. supplements.  We will recheck this a.m. and continue to trend.

## 2019-04-30 LAB — HEMOGLOBIN AND HEMATOCRIT, BLOOD
HCT: 43.3 % (ref 39.0–52.0)
Hemoglobin: 14.9 g/dL (ref 13.0–17.0)

## 2019-04-30 NOTE — Progress Notes (Signed)
Subjective:  CC: Mark Blevins is a 78 y.o. 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  Feeling better and less distended after NG placement.  Tolerating ice chips  ROS:  General: Denies weight loss, weight gain, fatigue, fevers, chills, and night sweats. Heart: Denies chest pain, palpitations, racing heart, irregular heartbeat, leg pain or swelling, and decreased activity tolerance. Respiratory: Denies breathing difficulty, shortness of breath, wheezing, cough, and sputum. GI: Denies change in appetite, heartburn, nausea, vomiting, constipation, diarrhea, and blood in stool. GU: Denies difficulty urinating, pain with urinating, urgency, frequency, blood in urine.   Objective:   Temp:  [97.5 F (36.4 C)-98 F (36.7 C)] 97.5 F (36.4 C) (06/20 1217) Pulse Rate:  [76-88] 76 (06/20 1217) Resp:  [16-18] 16 (06/20 1217) BP: (152-163)/(88-97) 159/95 (06/20 1217) SpO2:  [98 %-99 %] 98 % (06/20 1217)     Height: 6' (182.9 cm) Weight: 99.2 kg BMI (Calculated): 29.67   Intake/Output this shift:   Intake/Output Summary (Last 24 hours) at 04/30/2019 1936 Last data filed at 04/30/2019 1750 Gross per 24 hour  Intake 1984.55 ml  Output 2535 ml  Net -550.45 ml    Constitutional :  alert, cooperative, appears stated age and no distress  Respiratory:  clear to auscultation bilaterally  Cardiovascular:  regular rate and rhythm  Gastrointestinal: Soft, no guarding, decreased distention compared to previous day.  Ostomy patent when digitized with minimal bloody output.  Midline Praveena wound VAC intact.  JP still with serosanguinous drainage.   JP with serosanguineous drainage, no further bleeding around sit.  Skin: Cool and moist.   Psychiatric: Normal affect, non-agitated, not confused       LABS:  CMP Latest Ref Rng & Units 04/29/2019 04/28/2019 04/28/2019  Glucose 70 - 99 mg/dL  166(H) 125(H) 151(H)  BUN 8 - 23 mg/dL 17 16 14   Creatinine 0.61 - 1.24 mg/dL 1.12 1.31(H) 1.22  Sodium 135 - 145 mmol/L 135 136 136  Potassium 3.5 - 5.1 mmol/L 3.4(L) 3.4(L) 2.7(LL)  Chloride 98 - 111 mmol/L 99 100 102  CO2 22 - 32 mmol/L 26 24 23   Calcium 8.9 - 10.3 mg/dL 8.3(L) 8.7(L) 8.2(L)  Total Protein 6.5 - 8.1 g/dL - - -  Total Bilirubin 0.3 - 1.2 mg/dL - - -  Alkaline Phos 38 - 126 U/L - - -  AST 15 - 41 U/L - - -  ALT 0 - 44 U/L - - -   CBC Latest Ref Rng & Units 04/29/2019 04/28/2019 04/25/2019  WBC 4.0 - 10.5 K/uL 9.2 9.0 5.8  Hemoglobin 13.0 - 17.0 g/dL 14.8 12.8(L) 12.9(L)  Hematocrit 39.0 - 52.0 % 42.9 36.8(L) 37.0(L)  Platelets 150 - 400 K/uL 191 176 159    RADS: n/a Assessment:    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 Still with postoperative ileus but improved distention on exam today.  NPO, but ice chips ok.   Hopefully resolves in next day or two  Will repeat hgb tonight due to increased JP output recording, unsure why he will start bleeding POD#3, possibly toradol?  Hold toradol in the meantime.   Wound ostomy nurse consulted. appreciate follow-up and education  UTI- continue home antibiotics.

## 2019-05-01 LAB — CBC WITH DIFFERENTIAL/PLATELET
Abs Immature Granulocytes: 0.03 10*3/uL (ref 0.00–0.07)
Basophils Absolute: 0 10*3/uL (ref 0.0–0.1)
Basophils Relative: 0 %
Eosinophils Absolute: 0.3 10*3/uL (ref 0.0–0.5)
Eosinophils Relative: 4 %
HCT: 44.2 % (ref 39.0–52.0)
Hemoglobin: 15.1 g/dL (ref 13.0–17.0)
Immature Granulocytes: 0 %
Lymphocytes Relative: 29 %
Lymphs Abs: 2.3 10*3/uL (ref 0.7–4.0)
MCH: 33.6 pg (ref 26.0–34.0)
MCHC: 34.2 g/dL (ref 30.0–36.0)
MCV: 98.4 fL (ref 80.0–100.0)
Monocytes Absolute: 0.8 10*3/uL (ref 0.1–1.0)
Monocytes Relative: 10 %
Neutro Abs: 4.5 10*3/uL (ref 1.7–7.7)
Neutrophils Relative %: 57 %
Platelets: 224 10*3/uL (ref 150–400)
RBC: 4.49 MIL/uL (ref 4.22–5.81)
RDW: 12.4 % (ref 11.5–15.5)
WBC: 8 10*3/uL (ref 4.0–10.5)
nRBC: 0 % (ref 0.0–0.2)

## 2019-05-01 LAB — BASIC METABOLIC PANEL
Anion gap: 9 (ref 5–15)
BUN: 17 mg/dL (ref 8–23)
CO2: 26 mmol/L (ref 22–32)
Calcium: 8.2 mg/dL — ABNORMAL LOW (ref 8.9–10.3)
Chloride: 99 mmol/L (ref 98–111)
Creatinine, Ser: 0.88 mg/dL (ref 0.61–1.24)
GFR calc Af Amer: >60 mL/min (ref >60)
GFR calc non Af Amer: >60 mL/min (ref >60)
Glucose, Bld: 135 mg/dL — ABNORMAL HIGH (ref 70–99)
Potassium: 4 mmol/L (ref 3.5–5.1)
Sodium: 134 mmol/L — ABNORMAL LOW (ref 135–145)

## 2019-05-01 LAB — MAGNESIUM: Magnesium: 2.3 mg/dL (ref 1.7–2.4)

## 2019-05-01 LAB — PHOSPHORUS: Phosphorus: 2.2 mg/dL — ABNORMAL LOW (ref 2.5–4.6)

## 2019-05-01 NOTE — Progress Notes (Signed)
Subjective:  CC: Mark Blevins is a 78 y.o. 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 Little trouble sleeping, but otherwise no pain.  ROS:  General: Denies weight loss, weight gain, fatigue, fevers, chills, and night sweats. Heart: Denies chest pain, palpitations, racing heart, irregular heartbeat, leg pain or swelling, and decreased activity tolerance. Respiratory: Denies breathing difficulty, shortness of breath, wheezing, cough, and sputum. GI: Denies change in appetite, heartburn, nausea, vomiting, constipation, diarrhea, and blood in stool. GU: Denies difficulty urinating, pain with urinating, urgency, frequency, blood in urine.   Objective:   Temp:  [97.8 F (36.6 C)-98.3 F (36.8 C)] 98.3 F (36.8 C) (06/21 1206) Pulse Rate:  [69-81] 79 (06/21 1206) Resp:  [16-20] 16 (06/21 1206) BP: (152-160)/(93-99) 160/97 (06/21 1206) SpO2:  [98 %] 98 % (06/21 1206)     Height: 6' (182.9 cm) Weight: 99.2 kg BMI (Calculated): 29.67   Intake/Output this shift:   Intake/Output Summary (Last 24 hours) at 05/01/2019 1526 Last data filed at 05/01/2019 0857 Gross per 24 hour  Intake 779.54 ml  Output 1330 ml  Net -550.46 ml  NG with bilious output  JP with serosanguinous, more sanguinous today and less quantity  Constitutional :  alert, cooperative, appears stated age and no distress  Respiratory:  clear to auscultation bilaterally  Cardiovascular:  regular rate and rhythm  Gastrointestinal: Soft, no guarding, same distention compared to previous day but sitting today.  Ostomy patent, pink, less swollen today with minimal bloody output but now has some gas within it.  Midline Praveena wound VAC intact.  JP still with serosanguinous drainage.   JP with serosanguineous drainage, no further bleeding around sit.  Skin: Cool and moist.   Psychiatric: Normal affect, non-agitated,  not confused       LABS:  CMP Latest Ref Rng & Units 05/01/2019 04/29/2019 04/28/2019  Glucose 70 - 99 mg/dL 135(H) 166(H) 125(H)  BUN 8 - 23 mg/dL 17 17 16   Creatinine 0.61 - 1.24 mg/dL 0.88 1.12 1.31(H)  Sodium 135 - 145 mmol/L 134(L) 135 136  Potassium 3.5 - 5.1 mmol/L 4.0 3.4(L) 3.4(L)  Chloride 98 - 111 mmol/L 99 99 100  CO2 22 - 32 mmol/L 26 26 24   Calcium 8.9 - 10.3 mg/dL 8.2(L) 8.3(L) 8.7(L)  Total Protein 6.5 - 8.1 g/dL - - -  Total Bilirubin 0.3 - 1.2 mg/dL - - -  Alkaline Phos 38 - 126 U/L - - -  AST 15 - 41 U/L - - -  ALT 0 - 44 U/L - - -   CBC Latest Ref Rng & Units 05/01/2019 04/30/2019 04/29/2019  WBC 4.0 - 10.5 K/uL 8.0 - 9.2  Hemoglobin 13.0 - 17.0 g/dL 15.1 14.9 14.8  Hematocrit 39.0 - 52.0 % 44.2 43.3 42.9  Platelets 150 - 400 K/uL 224 - 191    RADS: n/a Assessment:    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 Likely improving ileus with gas in bag today, no output.  Will keep NG for one more day just in case and NPO ice chips ok.   May start diet tomorrow?  hgb stable, JP output more serous today.  Hold toradol for one more day just in case, patient not in terrible discomfort.  Wound ostomy nurse consulted. appreciate follow-up and education  UTI- continue home antibiotics.

## 2019-05-01 NOTE — Plan of Care (Signed)
Patient doing ok.  Incision/ wound vac in good place.  Abdomen still distended and not passing any flatus.  NG with greenish output.  JP with quite a bit of output.  Patient has ambulated once in the hallway and is planning to get up again this afternoon.  No significant changes.  I spoke to the patient's granddaughter and gave her an update.

## 2019-05-02 ENCOUNTER — Inpatient Hospital Stay: Payer: Medicare Other

## 2019-05-02 LAB — CBC WITH DIFFERENTIAL/PLATELET
Abs Immature Granulocytes: 0.05 10*3/uL (ref 0.00–0.07)
Basophils Absolute: 0 10*3/uL (ref 0.0–0.1)
Basophils Relative: 1 %
Eosinophils Absolute: 0.3 10*3/uL (ref 0.0–0.5)
Eosinophils Relative: 4 %
HCT: 43.7 % (ref 39.0–52.0)
Hemoglobin: 15.1 g/dL (ref 13.0–17.0)
Immature Granulocytes: 1 %
Lymphocytes Relative: 21 %
Lymphs Abs: 1.8 10*3/uL (ref 0.7–4.0)
MCH: 34.1 pg — ABNORMAL HIGH (ref 26.0–34.0)
MCHC: 34.6 g/dL (ref 30.0–36.0)
MCV: 98.6 fL (ref 80.0–100.0)
Monocytes Absolute: 0.9 10*3/uL (ref 0.1–1.0)
Monocytes Relative: 11 %
Neutro Abs: 5.4 10*3/uL (ref 1.7–7.7)
Neutrophils Relative %: 62 %
Platelets: 234 10*3/uL (ref 150–400)
RBC: 4.43 MIL/uL (ref 4.22–5.81)
RDW: 12.4 % (ref 11.5–15.5)
WBC: 8.4 10*3/uL (ref 4.0–10.5)
nRBC: 0 % (ref 0.0–0.2)

## 2019-05-02 LAB — BASIC METABOLIC PANEL
Anion gap: 9 (ref 5–15)
BUN: 21 mg/dL (ref 8–23)
CO2: 26 mmol/L (ref 22–32)
Calcium: 8.6 mg/dL — ABNORMAL LOW (ref 8.9–10.3)
Chloride: 102 mmol/L (ref 98–111)
Creatinine, Ser: 0.9 mg/dL (ref 0.61–1.24)
GFR calc Af Amer: >60 mL/min (ref >60)
GFR calc non Af Amer: >60 mL/min (ref >60)
Glucose, Bld: 143 mg/dL — ABNORMAL HIGH (ref 70–99)
Potassium: 4.4 mmol/L (ref 3.5–5.1)
Sodium: 137 mmol/L (ref 135–145)

## 2019-05-02 LAB — PHOSPHORUS: Phosphorus: 3.2 mg/dL (ref 2.5–4.6)

## 2019-05-02 LAB — MAGNESIUM: Magnesium: 2.3 mg/dL (ref 1.7–2.4)

## 2019-05-02 MED ORDER — DIATRIZOATE MEGLUMINE & SODIUM 66-10 % PO SOLN
90.0000 mL | Freq: Once | ORAL | Status: AC
  Administered 2019-05-02: 90 mL via NASOGASTRIC

## 2019-05-02 NOTE — Progress Notes (Signed)
Patient nauseated and vomited on himself. NGT noted to be at 35 cm mark which was different from Friday night /Saturday morning reading of 58 cm. NGT inserted to the 55 cm mark with patient's permission. Patient tolerated insertion well. 850 ml of green output immediately came out in the canister. Patient verbalized that he felt better already. Will continue to monitor. Terrial Rhodes

## 2019-05-02 NOTE — Progress Notes (Signed)
Subjective:  CC: Mark Blevins is a 78 y.o. 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 Doing ok this am, reports there was more gas in the bag prior to emptying it.  Pain better controlled.  ROS:  General: Denies weight loss, weight gain, fatigue, fevers, chills, and night sweats. Heart: Denies chest pain, palpitations, racing heart, irregular heartbeat, leg pain or swelling, and decreased activity tolerance. Respiratory: Denies breathing difficulty, shortness of breath, wheezing, cough, and sputum. GI: Denies change in appetite, heartburn, nausea, vomiting, constipation, diarrhea, and blood in stool. GU: Denies difficulty urinating, pain with urinating, urgency, frequency, blood in urine.   Objective:   Temp:  [97.3 F (36.3 C)-98.3 F (36.8 C)] 97.3 F (36.3 C) (06/22 0530) Pulse Rate:  [71-79] 71 (06/22 0530) Resp:  [16-20] 20 (06/22 0530) BP: (150-160)/(92-112) 156/112 (06/22 0530) SpO2:  [96 %-99 %] 96 % (06/22 0530)     Height: 6' (182.9 cm) Weight: 99.2 kg BMI (Calculated): 29.67   Intake/Output this shift:   Intake/Output Summary (Last 24 hours) at 05/02/2019 1038 Last data filed at 05/02/2019 0753 Gross per 24 hour  Intake 2541.88 ml  Output 2695 ml  Net -153.12 ml  NG with bilious output  JP with serosanguinous  Constitutional :  alert, cooperative, appears stated age and no distress  Respiratory:  clear to auscultation bilaterally  Cardiovascular:  regular rate and rhythm  Gastrointestinal: Soft, no guarding, decreasing distention compared to previous day.  Ostomy patent, less swollen today with minimal bloody output.  idline Praveena wound VAC removed, and staples c/d/i, some ecchymoses.  JP with lighter serosanguinous drainage.   JP with serosanguineous drainage, no further bleeding around sit.  Skin: Cool and moist.    Psychiatric: Normal affect, non-agitated, not confused       LABS:  CMP Latest Ref Rng & Units 05/02/2019 05/01/2019 04/29/2019  Glucose 70 - 99 mg/dL 143(H) 135(H) 166(H)  BUN 8 - 23 mg/dL 21 17 17   Creatinine 0.61 - 1.24 mg/dL 0.90 0.88 1.12  Sodium 135 - 145 mmol/L 137 134(L) 135  Potassium 3.5 - 5.1 mmol/L 4.4 4.0 3.4(L)  Chloride 98 - 111 mmol/L 102 99 99  CO2 22 - 32 mmol/L 26 26 26   Calcium 8.9 - 10.3 mg/dL 8.6(L) 8.2(L) 8.3(L)  Total Protein 6.5 - 8.1 g/dL - - -  Total Bilirubin 0.3 - 1.2 mg/dL - - -  Alkaline Phos 38 - 126 U/L - - -  AST 15 - 41 U/L - - -  ALT 0 - 44 U/L - - -   CBC Latest Ref Rng & Units 05/02/2019 05/01/2019 04/30/2019  WBC 4.0 - 10.5 K/uL 8.4 8.0 -  Hemoglobin 13.0 - 17.0 g/dL 15.1 15.1 14.9  Hematocrit 39.0 - 52.0 % 43.7 44.2 43.3  Platelets 150 - 400 K/uL 234 224 -    RADS: n/a Assessment:    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  Thought patient had improving ileus but had episode of emesis yesterday, will proceed with SBFT to see if we can see contrast progression. Ostomy site still swollen, slightly dusky? But intact and open for now.  Will consider TPN tomorrow depending on KUB and clinical exam.  hgb stable, JP output even moreserous today.  Wound ostomy nurse consulted. appreciate follow-up and education  UTI- continue home antibiotics.

## 2019-05-02 NOTE — Care Management Important Message (Signed)
Important Message  Patient Details  Name: Mark Blevins MRN: [id number] Date of Birth: 1941/10/05   Medicare Important Message Given:  Yes     Dannette Barbara 05/02/2019, 11:29 AM

## 2019-05-02 NOTE — Consult Note (Signed)
[place] Nurse ostomy consult note Prevena incisional NPWT is removed today with Dr. Lysle Pearl at the bedside.  The staple line, midline abdomen is intact and free of drainage or erythema Stoma type/location: LLQ Colosotmy Stomal assessment/size: 1 1/4" dark and moist.  Dr. Lysle Pearl has digitally explored stoma.  No stool noted today.  Patient has experienced gas. NG tube in place.  Wife at bedside and observing pouch change today.  Peristomal assessment: intact  Stoma is flush.  Adding barrier ring Treatment options for stomal/peristomal skin: barrier ring Output blood tinged liquid Ostomy pouching: 2pc. 2 1/4" pouch with barrier ring  Education provided: Wife observed removal of old pouch and NPWT dressing.  Discussed twice weekly pouch changes. Cleansing and rationale for barrier ring.  Applied two piece pouch and wife able to demonstrate roll closure.   Enrolled patient in [place] program:No [place] team will follow.  Domenic Moras MSN, RN, FNP-BC CWON Wound, Ostomy, Continence Nurse Pager [phone]

## 2019-05-03 LAB — BASIC METABOLIC PANEL
Anion gap: 13 (ref 5–15)
BUN: 22 mg/dL (ref 8–23)
CO2: 26 mmol/L (ref 22–32)
Calcium: 8.5 mg/dL — ABNORMAL LOW (ref 8.9–10.3)
Chloride: 97 mmol/L — ABNORMAL LOW (ref 98–111)
Creatinine, Ser: 1.02 mg/dL (ref 0.61–1.24)
GFR calc Af Amer: >60 mL/min (ref >60)
GFR calc non Af Amer: >60 mL/min (ref >60)
Glucose, Bld: 149 mg/dL — ABNORMAL HIGH (ref 70–99)
Potassium: 4.4 mmol/L (ref 3.5–5.1)
Sodium: 136 mmol/L (ref 135–145)

## 2019-05-03 LAB — CBC WITH DIFFERENTIAL/PLATELET
Abs Immature Granulocytes: 0.05 10*3/uL (ref 0.00–0.07)
Basophils Absolute: 0 10*3/uL (ref 0.0–0.1)
Basophils Relative: 0 %
Eosinophils Absolute: 0.4 10*3/uL (ref 0.0–0.5)
Eosinophils Relative: 4 %
HCT: 45.3 % (ref 39.0–52.0)
Hemoglobin: 15.5 g/dL (ref 13.0–17.0)
Immature Granulocytes: 1 %
Lymphocytes Relative: 17 %
Lymphs Abs: 1.7 10*3/uL (ref 0.7–4.0)
MCH: 33.7 pg (ref 26.0–34.0)
MCHC: 34.2 g/dL (ref 30.0–36.0)
MCV: 98.5 fL (ref 80.0–100.0)
Monocytes Absolute: 1.1 10*3/uL — ABNORMAL HIGH (ref 0.1–1.0)
Monocytes Relative: 11 %
Neutro Abs: 6.8 10*3/uL (ref 1.7–7.7)
Neutrophils Relative %: 67 %
Platelets: 226 10*3/uL (ref 150–400)
RBC: 4.6 MIL/uL (ref 4.22–5.81)
RDW: 12.4 % (ref 11.5–15.5)
WBC: 10 10*3/uL (ref 4.0–10.5)
nRBC: 0 % (ref 0.0–0.2)

## 2019-05-03 LAB — PHOSPHORUS: Phosphorus: 3.6 mg/dL (ref 2.5–4.6)

## 2019-05-03 LAB — MAGNESIUM: Magnesium: 2.3 mg/dL (ref 1.7–2.4)

## 2019-05-03 MED ORDER — PHENOL 1.4 % MT LIQD
1.0000 | OROMUCOSAL | Status: DC | PRN
  Filled 2019-05-03: qty 177

## 2019-05-03 MED ORDER — HYDRALAZINE HCL 20 MG/ML IJ SOLN
10.0000 mg | Freq: Three times a day (TID) | INTRAMUSCULAR | Status: DC | PRN
  Administered 2019-05-03: 07:00:00 10 mg via INTRAVENOUS
  Filled 2019-05-03: qty 1

## 2019-05-03 MED ORDER — BOOST / RESOURCE BREEZE PO LIQD CUSTOM
1.0000 | Freq: Three times a day (TID) | ORAL | Status: DC
  Administered 2019-05-04: 1 via ORAL

## 2019-05-03 NOTE — Progress Notes (Signed)
Initial Nutrition Assessment  DOCUMENTATION CODES:   Not applicable  INTERVENTION:   Recommend TPN if unable to advance diet today   Pt likely at low refeed risk   NUTRITION DIAGNOSIS:   Inadequate oral intake related to acute illness as evidenced by NPO status  GOAL:   Patient will meet greater than or equal to 90% of their needs  MONITOR:   Diet advancement, Labs, Weight trends, TF tolerance, Skin, I & O's  REASON FOR ASSESSMENT:   NPO/Clear Liquid Diet    ASSESSMENT:   78 y.o. 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  Per chart review, pt with poor appetite and oral intake for 4 days pta r/t abdominal distension and pain. Pt has been NPO since admit, now 7 days. Recommend TPN if unable to advance patient's diet today. Pt likely at low refeed risk as he has been on IV dextrose. NGT in place with 769ml output. JP drain with 28ml output. Pt is noted to have some stool in his ostomy bag today.    Per chart, pt with 37lb(15%) weight loss over the past year; this is not significant given the time frame.   Medications reviewed and include: lovenox, MVI, nitrofurantoin, NaCl w/ 5% dextrose @75ml /hr  Labs reviewed: K 4.4 wnl, P 3.6 wnl, Mg 2.3 wnl cbgs- 135, 143, 149 x 24 hrs  Unable to complete Nutrition-Focused physical exam at this time.   Diet Order:   Diet Order            Diet NPO time specified Except for: Sips with Meds  Diet effective now             EDUCATION NEEDS:   No education needs have been identified at this time  Skin:  Skin Assessment: Reviewed RN Assessment(incision abdomen)  Last BM:  6/23- type 6  Height:   Ht Readings from Last 1 Encounters:  04/27/19 6' (1.829 m)    Weight:   Wt Readings from Last 1 Encounters:  04/27/19 99.2 kg    Ideal Body Weight:  80.9 kg  BMI:  Body mass index is 29.67  kg/m.  Estimated Nutritional Needs:   Kcal:  2100-2400kcal/day  Protein:  105-120g/day  Fluid:  >2L/day  Koleen Distance MS, RD, LDN Pager #- [phone] Office#- [phone] After Hours Pager: [phone]

## 2019-05-03 NOTE — Plan of Care (Signed)
Patient doing well.  Patient has started putting stool out of his ostomy.  MD removed the NG tube and started the patient on clear liquids.  He is tolerating it well.  Incision looks good.  JP with serosangenious output.  He ambulated in the hallway today and sat up in the chair for a while today.  No significant changes.  I spoke to the patient's granddaughter and gave her an update today.

## 2019-05-04 LAB — BASIC METABOLIC PANEL
Anion gap: 10 (ref 5–15)
BUN: 25 mg/dL — ABNORMAL HIGH (ref 8–23)
CO2: 23 mmol/L (ref 22–32)
Calcium: 8.2 mg/dL — ABNORMAL LOW (ref 8.9–10.3)
Chloride: 101 mmol/L (ref 98–111)
Creatinine, Ser: 1.14 mg/dL (ref 0.61–1.24)
GFR calc Af Amer: >60 mL/min (ref >60)
GFR calc non Af Amer: >60 mL/min (ref >60)
Glucose, Bld: 127 mg/dL — ABNORMAL HIGH (ref 70–99)
Potassium: 4.2 mmol/L (ref 3.5–5.1)
Sodium: 134 mmol/L — ABNORMAL LOW (ref 135–145)

## 2019-05-04 LAB — CBC WITH DIFFERENTIAL/PLATELET
Abs Immature Granulocytes: 0.07 10*3/uL (ref 0.00–0.07)
Basophils Absolute: 0.1 10*3/uL (ref 0.0–0.1)
Basophils Relative: 1 %
Eosinophils Absolute: 0.6 10*3/uL — ABNORMAL HIGH (ref 0.0–0.5)
Eosinophils Relative: 6 %
HCT: 43.9 % (ref 39.0–52.0)
Hemoglobin: 15.2 g/dL (ref 13.0–17.0)
Immature Granulocytes: 1 %
Lymphocytes Relative: 23 %
Lymphs Abs: 2 10*3/uL (ref 0.7–4.0)
MCH: 34.5 pg — ABNORMAL HIGH (ref 26.0–34.0)
MCHC: 34.6 g/dL (ref 30.0–36.0)
MCV: 99.5 fL (ref 80.0–100.0)
Monocytes Absolute: 1.1 10*3/uL — ABNORMAL HIGH (ref 0.1–1.0)
Monocytes Relative: 12 %
Neutro Abs: 5.1 10*3/uL (ref 1.7–7.7)
Neutrophils Relative %: 57 %
Platelets: 219 10*3/uL (ref 150–400)
RBC: 4.41 MIL/uL (ref 4.22–5.81)
RDW: 12.4 % (ref 11.5–15.5)
WBC: 8.8 10*3/uL (ref 4.0–10.5)
nRBC: 0 % (ref 0.0–0.2)

## 2019-05-04 LAB — MAGNESIUM: Magnesium: 2.3 mg/dL (ref 1.7–2.4)

## 2019-05-04 LAB — PHOSPHORUS: Phosphorus: 3.9 mg/dL (ref 2.5–4.6)

## 2019-05-04 MED ORDER — SIMETHICONE 80 MG PO CHEW
80.0000 mg | CHEWABLE_TABLET | Freq: Four times a day (QID) | ORAL | Status: DC | PRN
  Filled 2019-05-04: qty 1

## 2019-05-04 MED ORDER — ENSURE ENLIVE PO LIQD
237.0000 mL | Freq: Two times a day (BID) | ORAL | Status: DC
  Administered 2019-05-04: 14:00:00 237 mL via ORAL

## 2019-05-04 NOTE — TOC Initial Note (Signed)
Transition of Care [facility](TOC) - Initial/Assessment Note    Patient Details  Name: Mark Blevins MRN: [id number] Date of Birth: 03-16-1941  Transition of Care [facility](TOC) CM/SW Contact:    Chapman FitchBOWEN, Kristan Brummitt T, RN Phone Number: 05/04/2019, 4:33 PM  Clinical Narrative:                 Late Entry.  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  Son lives locally for support. At baseline patient is independent and still drives.   [facility]  Denies any issues obtaining medications.   RNCM discussed home health services for ostomy care and education.  Patient agreeable.  Patient states he does not have a preference of agency.  RNCM discussed case with MD.  Due to patient payor source and [facility]  Patient notified that [facility], due to patient not having a preference.  Patient in agreement.  Heads up [facility]   Expected Discharge Plan: Home w Home Health Services Barriers to Discharge: Continued Medical Work up   Patient Goals and CMS Choice   CMS Medicare.gov Compare Post Acute Care list provided to:: Patient Choice offered to / list presented to : Patient  Expected Discharge Plan and Services Expected Discharge Plan: Home w Home Health Services   Discharge Planning Services: CM Consult   Living arrangements for the past 2 months: Single Family Home                           [facility]: 04/29/19 Time HH Agency Contacted: 1632 Representative [facility]: [facility]  Prior Living Arrangements/Services Living arrangements for the past 2 months: Single Family Home Lives with:: Spouse Patient language and need for interpreter reviewed:: Yes Do you feel safe going back to the place where you live?: Yes      Need for Family Participation in  Patient Care: Yes (Comment) Care giver support system in place?: Yes (comment)   Criminal Activity/Legal Involvement Pertinent to Current Situation/Hospitalization: No - Comment as needed  Activities of Daily Living Home Assistive Devices/Equipment: None ADL Screening (condition at time of admission) Patient's cognitive ability adequate to safely complete daily activities?: Yes Is the patient deaf or have difficulty hearing?: No Does the patient have difficulty seeing, even when wearing glasses/contacts?: No Does the patient have difficulty concentrating, remembering, or making decisions?: No Patient able to express need for assistance with ADLs?: Yes Does the patient have difficulty dressing or bathing?: No Independently performs ADLs?: Yes (appropriate for developmental age) Does the patient have difficulty walking or climbing stairs?: No Weakness of Legs: None Weakness of Arms/Hands: None  Permission Sought/Granted                  Emotional Assessment Appearance:: Appears stated age Attitude/Demeanor/Rapport: Gracious Affect (typically observed): Accepting Orientation: : Oriented to Self, Oriented to Place, Oriented to  Time, Oriented to Situation   Psych Involvement: No (comment)  Admission diagnosis:  Sigmoid Volvolus Patient Active Problem List   Diagnosis Date Noted  . 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 Hypertension 12/29/2017  . Swelling of limb 12/29/2017  . 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

## 2019-05-04 NOTE — Consult Note (Signed)
[place] Nurse ostomy follow up Pouch was starting to lift  Pouch change performed.  Supplies are placed in room for discharge.  4 pouch sets and 4 barrier ring Stoma type/location: LLQ colostomy  Tan stool noted in pouch Stomal assessment/size: 1 1/4" less dark today and moist. Patent and producing tan stool  Peristomal assessment: Intact  Stoma is flush Treatment options for stomal/peristomal skin: barrier ring.  Output soft tan stool Ostomy pouching: 2pc. 2 1/4 "pouch with barrier ring  Education provided: Patient feeling much better.  Observes pouch change and asks appropriate questions.  Twice weekly pouch changes.  Discussed showering, activity level and HH will reinforce teaching at home.  Supplies being sent home with patient.  Enrolled patient in [place] Start Discharge program: Yes today [place] team will follow.  Domenic Moras MSN, RN, FNP-BC CWON Wound, Ostomy, Continence Nurse Pager [phone]

## 2019-05-05 ENCOUNTER — Inpatient Hospital Stay: Payer: Medicare Other

## 2019-05-05 ENCOUNTER — Inpatient Hospital Stay: Payer: Self-pay

## 2019-05-05 LAB — BASIC METABOLIC PANEL
Anion gap: 8 (ref 5–15)
BUN: 27 mg/dL — ABNORMAL HIGH (ref 8–23)
CO2: 25 mmol/L (ref 22–32)
Calcium: 8.6 mg/dL — ABNORMAL LOW (ref 8.9–10.3)
Chloride: 100 mmol/L (ref 98–111)
Creatinine, Ser: 1.28 mg/dL — ABNORMAL HIGH (ref 0.61–1.24)
GFR calc Af Amer: >60 mL/min (ref >60)
GFR calc non Af Amer: 53 mL/min — ABNORMAL LOW (ref >60)
Glucose, Bld: 140 mg/dL — ABNORMAL HIGH (ref 70–99)
Potassium: 4.7 mmol/L (ref 3.5–5.1)
Sodium: 133 mmol/L — ABNORMAL LOW (ref 135–145)

## 2019-05-05 LAB — C DIFFICILE QUICK SCREEN W PCR REFLEX
C Diff antigen: NEGATIVE
C Diff interpretation: NOT DETECTED
C Diff toxin: NEGATIVE

## 2019-05-05 LAB — CBC WITH DIFFERENTIAL/PLATELET
Abs Immature Granulocytes: 0.06 10*3/uL (ref 0.00–0.07)
Basophils Absolute: 0.1 10*3/uL (ref 0.0–0.1)
Basophils Relative: 1 %
Eosinophils Absolute: 0.4 10*3/uL (ref 0.0–0.5)
Eosinophils Relative: 5 %
HCT: 46.2 % (ref 39.0–52.0)
Hemoglobin: 15.5 g/dL (ref 13.0–17.0)
Immature Granulocytes: 1 %
Lymphocytes Relative: 23 %
Lymphs Abs: 2.1 10*3/uL (ref 0.7–4.0)
MCH: 33.8 pg (ref 26.0–34.0)
MCHC: 33.5 g/dL (ref 30.0–36.0)
MCV: 100.9 fL — ABNORMAL HIGH (ref 80.0–100.0)
Monocytes Absolute: 0.9 10*3/uL (ref 0.1–1.0)
Monocytes Relative: 10 %
Neutro Abs: 5.6 10*3/uL (ref 1.7–7.7)
Neutrophils Relative %: 60 %
Platelets: 274 10*3/uL (ref 150–400)
RBC: 4.58 MIL/uL (ref 4.22–5.81)
RDW: 12.3 % (ref 11.5–15.5)
WBC: 9.1 10*3/uL (ref 4.0–10.5)
nRBC: 0 % (ref 0.0–0.2)

## 2019-05-05 LAB — MAGNESIUM: Magnesium: 2.2 mg/dL (ref 1.7–2.4)

## 2019-05-05 LAB — GLUCOSE, CAPILLARY: Glucose-Capillary: 113 mg/dL — ABNORMAL HIGH (ref 70–99)

## 2019-05-05 LAB — PHOSPHORUS: Phosphorus: 3.8 mg/dL (ref 2.5–4.6)

## 2019-05-05 MED ORDER — LACTATED RINGERS IV SOLN
INTRAVENOUS | Status: DC
  Administered 2019-05-05: 10:00:00 via INTRAVENOUS

## 2019-05-05 MED ORDER — LACTATED RINGERS IV SOLN
INTRAVENOUS | Status: AC
  Administered 2019-05-05 – 2019-05-06 (×3): via INTRAVENOUS

## 2019-05-05 MED ORDER — IOHEXOL 300 MG/ML  SOLN
100.0000 mL | Freq: Once | INTRAMUSCULAR | Status: AC | PRN
  Administered 2019-05-05: 09:00:00 100 mL via INTRAVENOUS

## 2019-05-05 MED ORDER — SIMETHICONE 80 MG PO CHEW
80.0000 mg | CHEWABLE_TABLET | Freq: Four times a day (QID) | ORAL | Status: DC
  Administered 2019-05-10 – 2019-05-11 (×4): 80 mg via ORAL
  Filled 2019-05-05 (×28): qty 1

## 2019-05-05 MED ORDER — FAT EMULSION PLANT BASED 20 % IV EMUL
300.0000 mL | INTRAVENOUS | Status: AC
  Administered 2019-05-05: 300 mL via INTRAVENOUS
  Filled 2019-05-05: qty 300

## 2019-05-05 MED ORDER — SODIUM CHLORIDE 0.9% FLUSH: 10.0000 mL | INTRAVENOUS | Status: DC | PRN

## 2019-05-05 MED ORDER — INSULIN ASPART 100 UNIT/ML ~~LOC~~ SOLN
0.0000 [IU] | Freq: Four times a day (QID) | SUBCUTANEOUS | Status: DC
  Administered 2019-05-06: 1 [IU] via SUBCUTANEOUS
  Administered 2019-05-06 – 2019-05-07 (×2): 2 [IU] via SUBCUTANEOUS
  Administered 2019-05-07: 5 [IU] via SUBCUTANEOUS
  Administered 2019-05-07 – 2019-05-08 (×2): 2 [IU] via SUBCUTANEOUS
  Administered 2019-05-08: 1 [IU] via SUBCUTANEOUS
  Administered 2019-05-08 – 2019-05-10 (×5): 2 [IU] via SUBCUTANEOUS
  Administered 2019-05-10 (×2): 1 [IU] via SUBCUTANEOUS
  Filled 2019-05-05 (×14): qty 1

## 2019-05-05 MED ORDER — M.V.I. ADULT IV INJ
INTRAVENOUS | Status: AC
  Administered 2019-05-05: 22:00:00 via INTRAVENOUS
  Filled 2019-05-05: qty 960

## 2019-05-05 MED ORDER — SODIUM CHLORIDE 0.9% FLUSH
10.0000 mL | Freq: Two times a day (BID) | INTRAVENOUS | Status: DC
  Administered 2019-05-05 – 2019-05-11 (×8): 10 mL

## 2019-05-05 NOTE — Progress Notes (Signed)
Peripherally Inserted Central Catheter/Midline Placement  The IV Nurse has discussed with the patient and/or persons authorized to consent for the patient, the purpose of this procedure and the potential benefits and risks involved with this procedure.  The benefits include less needle sticks, lab draws from the catheter, and the patient may be discharged home with the catheter. Risks include, but not limited to, infection, bleeding, blood clot (thrombus formation), and puncture of an artery; nerve damage and irregular heartbeat and possibility to perform a PICC exchange if needed/ordered by physician.  Alternatives to this procedure were also discussed.  Bard Power PICC patient education guide, fact sheet on infection prevention and patient information card has been provided to patient /or left at bedside.    PICC/Midline Placement Documentation  PICC Double Lumen 19/41/74 PICC Right Basilic 42 cm 0 cm (Active)  Indication for Insertion or Continuance of Line Administration of hyperosmolar/irritating solutions (i.e. TPN, Vancomycin, etc.) 05/05/19 2011  Exposed Catheter (cm) 0 cm 05/05/19 2011  Site Assessment Clean;Dry;Intact 05/05/19 2011  Lumen #1 Status Flushed;Saline locked;Blood return noted 05/05/19 2011  Lumen #2 Status Flushed;Saline locked;Blood return noted 05/05/19 2011  Dressing Type Transparent;Securing device 05/05/19 2011  Dressing Status Clean;Dry;Intact;Antimicrobial disc in place 05/05/19 2011  Line Care Connections checked and tightened 05/05/19 2011  Dressing Intervention New dressing 05/05/19 2011  Dressing Change Due 05/12/19 05/05/19 2011       Virgilio Belling 05/05/2019, 8:12 PM

## 2019-05-05 NOTE — Progress Notes (Addendum)
Subjective:  CC: Mark Blevins is a 78 y.o. 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  More stool and gas in bag.  Not quite hungry  ROS:  General: Denies weight loss, weight gain, fatigue, fevers, chills, and night sweats. Heart: Denies chest pain, palpitations, racing heart, irregular heartbeat, leg pain or swelling, and decreased activity tolerance. Respiratory: Denies breathing difficulty, shortness of breath, wheezing, cough, and sputum. GI: Denies change in appetite, heartburn, nausea, vomiting, constipation, diarrhea, and blood in stool. GU: Denies difficulty urinating, pain with urinating, urgency, frequency, blood in urine.   Objective:   Temp:  [97.4 F (36.3 C)-98 F (36.7 C)] 97.4 F (36.3 C) (06/25 1302) Pulse Rate:  [53-91] 53 (06/25 1302) Resp:  [16-18] 16 (06/25 1302) BP: (132-149)/(88-99) 146/91 (06/25 1302) SpO2:  [96 %-100 %] 97 % (06/25 1302)     Height: 6' (182.9 cm) Weight: 99.2 kg BMI (Calculated): 29.67   Intake/Output this shift:   Intake/Output Summary (Last 24 hours) at 05/05/2019 1735 Last data filed at 05/05/2019 1658 Gross per 24 hour  Intake 2155.11 ml  Output 2322 ml  Net -166.89 ml  NG with bilious output  JP with serosanguinous  Constitutional :  alert, cooperative, appears stated age and no distress  Respiratory:  clear to auscultation bilaterally  Cardiovascular:  regular rate and rhythm  Gastrointestinal: Soft, no guarding, slight increase in distention compared to previous day.  Ostomy still with stool.  JP light serosanguinous drainage.    Skin: Cool and moist.   Psychiatric: Normal affect, non-agitated, not confused       LABS:  CMP Latest Ref Rng & Units 05/05/2019 05/04/2019 05/03/2019  Glucose 70 - 99 mg/dL 140(H) 127(H) 149(H)  BUN 8 - 23 mg/dL 27(H) 25(H) 22  Creatinine 0.61 - 1.24 mg/dL 1.28(H) 1.14 1.02  Sodium 135 - 145  mmol/L 133(L) 134(L) 136  Potassium 3.5 - 5.1 mmol/L 4.7 4.2 4.4  Chloride 98 - 111 mmol/L 100 101 97(L)  CO2 22 - 32 mmol/L 25 23 26   Calcium 8.9 - 10.3 mg/dL 8.6(L) 8.2(L) 8.5(L)  Total Protein 6.5 - 8.1 g/dL - - -  Total Bilirubin 0.3 - 1.2 mg/dL - - -  Alkaline Phos 38 - 126 U/L - - -  AST 15 - 41 U/L - - -  ALT 0 - 44 U/L - - -   CBC Latest Ref Rng & Units 05/05/2019 05/04/2019 05/03/2019  WBC 4.0 - 10.5 K/uL 9.1 8.8 10.0  Hemoglobin 13.0 - 17.0 g/dL 15.5 15.2 15.5  Hematocrit 39.0 - 52.0 % 46.2 43.9 45.3  Platelets 150 - 400 K/uL 274 219 226    RADS: n/a Assessment:    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  More distention today, so will keep observing one more night.  Try regular diet in the meantime since ostomy still has decent output

## 2019-05-05 NOTE — Progress Notes (Signed)
Subjective:  CC: Mark Blevins is a 78 y.o. 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 Feeling not well.  ROS:  General: Denies weight loss, weight gain, fatigue, fevers, chills, and night sweats. Heart: Denies chest pain, palpitations, racing heart, irregular heartbeat, leg pain or swelling, and decreased activity tolerance. Respiratory: Denies breathing difficulty, shortness of breath, wheezing, cough, and sputum. GI: Denies change in appetite, heartburn, nausea, vomiting, constipation, diarrhea, and blood in stool. GU: Denies difficulty urinating, pain with urinating, urgency, frequency, blood in urine.   Objective:   Temp:  [97.4 F (36.3 C)-98 F (36.7 C)] 97.4 F (36.3 C) (06/25 1302) Pulse Rate:  [53-91] 53 (06/25 1302) Resp:  [16-18] 16 (06/25 1302) BP: (132-149)/(88-99) 146/91 (06/25 1302) SpO2:  [96 %-100 %] 97 % (06/25 1302)     Height: 6' (182.9 cm) Weight: 99.2 kg BMI (Calculated): 29.67   Intake/Output this shift:   Intake/Output Summary (Last 24 hours) at 05/05/2019 1822 Last data filed at 05/05/2019 1809 Gross per 24 hour  Intake 2155.11 ml  Output 3022 ml  Net -866.89 ml  NG with bilious output  JP with serosanguinous  Constitutional :  alert, cooperative, appears stated age and no distress  Respiratory:  clear to auscultation bilaterally  Cardiovascular:  regular rate and rhythm  Gastrointestinal: Soft, no guarding, but increasing distention again compared to previous day.  Ostomy yet again with stool.  JP light serosanguinous drainage.    Skin: Cool and moist.   Psychiatric: Normal affect, non-agitated, not confused       LABS:  CMP Latest Ref Rng & Units 05/05/2019 05/04/2019 05/03/2019  Glucose 70 - 99 mg/dL 140(H) 127(H) 149(H)  BUN 8 - 23 mg/dL 27(H) 25(H) 22  Creatinine 0.61 - 1.24 mg/dL 1.28(H) 1.14 1.02  Sodium 135 - 145 mmol/L 133(L) 134(L) 136   Potassium 3.5 - 5.1 mmol/L 4.7 4.2 4.4  Chloride 98 - 111 mmol/L 100 101 97(L)  CO2 22 - 32 mmol/L 25 23 26   Calcium 8.9 - 10.3 mg/dL 8.6(L) 8.2(L) 8.5(L)  Total Protein 6.5 - 8.1 g/dL - - -  Total Bilirubin 0.3 - 1.2 mg/dL - - -  Alkaline Phos 38 - 126 U/L - - -  AST 15 - 41 U/L - - -  ALT 0 - 44 U/L - - -   CBC Latest Ref Rng & Units 05/05/2019 05/04/2019 05/03/2019  WBC 4.0 - 10.5 K/uL 9.1 8.8 10.0  Hemoglobin 13.0 - 17.0 g/dL 15.5 15.2 15.5  Hematocrit 39.0 - 52.0 % 46.2 43.9 45.3  Platelets 150 - 400 K/uL 274 219 226    RADS: CLINICAL DATA:  78 year old male with a history of sigmoid volvulus, status post left colectomy and end colostomy.  EXAM: CT ABDOMEN AND PELVIS WITH CONTRAST  TECHNIQUE: Multidetector CT imaging of the abdomen and pelvis was performed using the standard protocol following bolus administration of intravenous contrast.  CONTRAST:  17mL OMNIPAQUE IOHEXOL 300 MG/ML  SOLN  COMPARISON:  CT 04/24/2019, with interval plain film  FINDINGS: Lower chest: Trace pleural effusion on the right with associated atelectasis.  Hepatobiliary: Unremarkable liver. Gallbladder partially distended with high density material layered dependently. No pericholecystic inflammatory changes. No intrahepatic ductal dilatation. Unremarkable appearance of the extrahepatic biliary ducts.  Pancreas: Unremarkable  Spleen: Unremarkable  Adrenals/Urinary Tract: Unremarkable adrenal glands.  Bilateral kidneys demonstrate mild cortical thinning, similar to the prior. No hydronephrosis or  nephrolithiasis.  Redemonstration of slight hypodensity of the superior left cortex and lateral left cortex, though improved from the comparison CT. Unremarkable course of the bilateral ureters.  Urinary bladder partially distended.  Stomach/Bowel: Distention of the stomach with fluid and air. Proximal small bowel is distended with multiple air-fluid levels. There is a  transition point in the mid low abdomen at the level of the sacral base, image 62 of series 2 on the axial images. Beyond this, small bowel is completely decompressed to the terminal ileum.  Surgical changes of left colectomy. There is enteric contrast within the colon, including right colon transverse colon, and extending to the ostomy. Surgical changes at the rectal stump with minimal retained fluid in the rectum.  Bowel wall appears to enhance throughout with no focal wall thickening of the small bowel or colon.  Vascular/Lymphatic: Minimal atherosclerotic changes of the abdominal aorta. Bilateral iliac arteries and proximal femoral arteries are patent.  No adenopathy.  Reproductive: Calcifications of the prostate. Transverse diameter measures 5.3 cm.  Other: Surgical changes along the midline abdomen. Small hematoma adjacent to a percutaneous surgical drain within the low right abdominal wall. Surgical drain terminates within the pelvis, on the left.  There are a few tiny gas locules of the upper abdomen and the low midline abdomen, likely postoperative. No significant ascites, or free fluid.  Musculoskeletal: No acute displaced fracture. Degenerative changes of the thoracolumbar spine. Vacuum disc phenomenon at multiple levels. Degenerative changes of the hips.  IMPRESSION: The pattern of proximal small bowel and stomach distension is suggestive of at least partial small bowel obstruction, as there is a transition point in the low midline abdomen, with decompression of distal small bowel and colon.  Surgical changes of left colectomy and left abdominal and ostomy for treatment of prior volvulus, with surgical drain from right abdominal approach terminating in the low abdomen. No evidence of significant ascites/free fluid. A few small gas pockets within the abdomen are favored to be postoperative.  Left kidney demonstrates improved and more uniform  perfusion, though there are persisting regions at the superior cortex and lateral cortex of decreased attenuation which may represent ongoing pyelonephritis.  Trace right-sided pleural effusion.  Ancillary findings as above.   Electronically Signed   By: Gilmer MorJaime  Wagner D.O.   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  Repeat CT done due to increasing distention and emesis despite continuing stool output.  Results show partial SBO, but some swirling noted within mesentary right at point of transition noted.  Very rare timing wise for adhesions to develop and to have any mesenteric issues only a week out from previous surgery, but due to concerns on imaging as well as patient's poor tolerance of NG decompression, I recommended a diagnostic laparoscopy to see definitively what is causing his persistent obstruction.  Pt disappointed but agreeable to plan.  Son updated as well.  Will allow to decompress overnight since otherwise patient is stable to maximize completion of procedure laparoscopically.

## 2019-05-05 NOTE — Progress Notes (Signed)
Nutrition Follow Up Note   DOCUMENTATION CODES:   Not applicable  INTERVENTION:    Initiate Clinimix 5/20 with electrolytes at 57m/hr (Goal rate 888mhr once labs stable)  Initiate 20% lipids @ 2583mr x 12 hrs per day   Regimen @ goal rate will provide 2353cal/day, 100g/day protein, 2292m44mlume    Add MVI daily and trace elements every other day per shortage    Daily weights  Monitor K, Mg and P labs daily until stable; suspect pt at low refeed risk   Keep total TPN + IVF rate at 100ml42mper MD  NUTRITION DIAGNOSIS:   Inadequate oral intake related to acute illness as evidenced by NPO status  GOAL:   Patient will meet greater than or equal to 90% of their needs  -not met   MONITOR:   Diet advancement, Labs, Weight trends, TF tolerance, Skin, I & O's, TPN  REASON FOR ASSESSMENT:   Consult New TPN/TNA  ASSESSMENT:   78 y.39 male with h/o BPH, HTN s/p 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  Pt unable to advance diet. Will initiate TPN today. Pt likely at low refeed risk.   Medications reviewed and include: lovenox, MVI, simethicone, LRS '@100ml'$ /hr  Labs reviewed: Na 133(L), K 4.7 wnl, P 3.8 wnl, Mg 2.2 wnl cbgs- 127, 140 x 24 hrs  Diet Order:   Diet Order            Diet NPO time specified Except for: Sips with Meds  Diet effective now             EDUCATION NEEDS:   No education needs have been identified at this time  Skin:  Skin Assessment: Reviewed RN Assessment(incision abdomen)  Last BM:  6/24  Height:   Ht Readings from Last 1 Encounters:  04/27/19 6' (1.829 m)    Weight:   Wt Readings from Last 1 Encounters:  04/27/19 99.2 kg    Ideal Body Weight:  80.9 kg  BMI:  Body mass index is 29.67 kg/m.  Estimated Nutritional Needs:   Kcal:  2100-2400kcal/day  Protein:  105-120g/day  Fluid:  >2L/day  CaseyKoleen DistanceRD,  LDN Pager #- [phone]#- [phone] Hours Pager: [phone]

## 2019-05-05 NOTE — Progress Notes (Addendum)
Subjective:  CC: Mark Blevins is a 78 y.o. 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  Requesting NG to be removed.   ROS:  General: Denies weight loss, weight gain, fatigue, fevers, chills, and night sweats. Heart: Denies chest pain, palpitations, racing heart, irregular heartbeat, leg pain or swelling, and decreased activity tolerance. Respiratory: Denies breathing difficulty, shortness of breath, wheezing, cough, and sputum. GI: Denies change in appetite, heartburn, nausea, vomiting, constipation, diarrhea, and blood in stool. GU: Denies difficulty urinating, pain with urinating, urgency, frequency, blood in urine.   Objective:   Temp:  [97.4 F (36.3 C)-98 F (36.7 C)] 97.4 F (36.3 C) (06/25 1302) Pulse Rate:  [53-91] 53 (06/25 1302) Resp:  [16-18] 16 (06/25 1302) BP: (132-149)/(88-99) 146/91 (06/25 1302) SpO2:  [96 %-100 %] 97 % (06/25 1302)     Height: 6' (182.9 cm) Weight: 99.2 kg BMI (Calculated): 29.67   Intake/Output this shift:   Intake/Output Summary (Last 24 hours) at 05/05/2019 1731 Last data filed at 05/05/2019 1658 Gross per 24 hour  Intake 2155.11 ml  Output 2322 ml  Net -166.89 ml  NG with bilious output  JP with serosanguinous  Constitutional :  alert, cooperative, appears stated age and no distress  Respiratory:  clear to auscultation bilaterally  Cardiovascular:  regular rate and rhythm  Gastrointestinal: Soft, no guarding, decreasing distention compared to previous day.  Ostomy patent, now with stool.  staples c/d/i.  JP with lighter serosanguinous drainage.   JP with serosanguineous drainage, no further bleeding around sit.  Skin: Cool and moist.   Psychiatric: Normal affect, non-agitated, not confused       LABS:  CMP Latest Ref Rng & Units 05/05/2019 05/04/2019 05/03/2019  Glucose 70 - 99 mg/dL 140(H) 127(H) 149(H)  BUN 8  - 23 mg/dL 27(H) 25(H) 22  Creatinine 0.61 - 1.24 mg/dL 1.28(H) 1.14 1.02  Sodium 135 - 145 mmol/L 133(L) 134(L) 136  Potassium 3.5 - 5.1 mmol/L 4.7 4.2 4.4  Chloride 98 - 111 mmol/L 100 101 97(L)  CO2 22 - 32 mmol/L 25 23 26   Calcium 8.9 - 10.3 mg/dL 8.6(L) 8.2(L) 8.5(L)  Total Protein 6.5 - 8.1 g/dL - - -  Total Bilirubin 0.3 - 1.2 mg/dL - - -  Alkaline Phos 38 - 126 U/L - - -  AST 15 - 41 U/L - - -  ALT 0 - 44 U/L - - -   CBC Latest Ref Rng & Units 05/05/2019 05/04/2019 05/03/2019  WBC 4.0 - 10.5 K/uL 9.1 8.8 10.0  Hemoglobin 13.0 - 17.0 g/dL 15.5 15.2 15.5  Hematocrit 39.0 - 52.0 % 46.2 43.9 45.3  Platelets 150 - 400 K/uL 274 219 226    RADS: n/a Assessment:    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  Stool now in bag.  Remove NG and continue to monitor for improvement. Start clears

## 2019-05-05 NOTE — Consult Note (Addendum)
PHARMACY - ADULT TOTAL PARENTERAL NUTRITION CONSULT NOTE   Pharmacy Consult for TPN Indication: Intolerance to enteral nutrition  Patient Measurements: Height: 6' (182.9 cm) Weight: 218 lb 12.8 oz (99.2 kg) IBW/kg (Calculated) : 77.6 TPN AdjBW (KG): 83 Body mass index is 29.67 kg/m.  Assessment:  78 y.o. 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 Will initiate TPN today. Pt likely at low refeed risk.   Medications reviewed and include: lovenox, MVI, simethicone, LRS @100ml /hr  Labs reviewed: Na 133(L), K 4.7 wnl, P 3.8 wnl, Mg 2.2 wnl  cbgs- 127, 140 x 24 hrs  No current insulin requirements - pharmacy will follow BG's  TPN Access:  Pending PICC TPN start date: 05/05/2019  Will initiate Clinimix 5/20 with electrolytes at 58ml/hr (Goal rate 63ml/hr once labs stable)  Will Initiate 20% lipids @ 32ml/hr x 12 hrs per day  Regimen @ goal rate will provide 2353cal/day, 100g/day protein, 2229ml total volume (with TPN + lipids)  Add MVI daily and trace elements every other day per shortage (receiving 6/25)  Will monitor K, Mg and P labs daily until stable; suspect pt at low refeed risk  F/U Keep TPN + IVF rate @ 141ml/hr per MD  (will decrease LR to 35 ml/hr @ 1800 when TPN starts)  Lu Duffel, PharmD, BCPS Clinical Pharmacist 05/05/2019 10:56 AM

## 2019-05-06 ENCOUNTER — Inpatient Hospital Stay: Payer: Medicare Other | Admitting: Anesthesiology

## 2019-05-06 ENCOUNTER — Encounter
Admission: AD | Disposition: A | Discharge status: Home / Self Care | Payer: Self-pay | Source: Ambulatory Visit | Attending: Surgery

## 2019-05-06 DIAGNOSIS — K56609 Unspecified intestinal obstruction, unspecified as to partial versus complete obstruction: Secondary | ICD-10-CM | POA: Diagnosis not present

## 2019-05-06 DIAGNOSIS — I10 Essential (primary) hypertension: Secondary | ICD-10-CM | POA: Diagnosis not present

## 2019-05-06 HISTORY — PX: LAPAROSCOPIC LYSIS OF ADHESIONS: SHX5905

## 2019-05-06 HISTORY — PX: LAPAROSCOPY: SHX197

## 2019-05-06 LAB — CBC
HCT: 42.5 % (ref 39.0–52.0)
Hemoglobin: 14.6 g/dL (ref 13.0–17.0)
MCH: 34 pg (ref 26.0–34.0)
MCHC: 34.4 g/dL (ref 30.0–36.0)
MCV: 98.8 fL (ref 80.0–100.0)
Platelets: 251 10*3/uL (ref 150–400)
RBC: 4.3 MIL/uL (ref 4.22–5.81)
RDW: 12.3 % (ref 11.5–15.5)
WBC: 8.2 10*3/uL (ref 4.0–10.5)
nRBC: 0 % (ref 0.0–0.2)

## 2019-05-06 LAB — BASIC METABOLIC PANEL
Anion gap: 11 (ref 5–15)
BUN: 28 mg/dL — ABNORMAL HIGH (ref 8–23)
CO2: 27 mmol/L (ref 22–32)
Calcium: 8.4 mg/dL — ABNORMAL LOW (ref 8.9–10.3)
Chloride: 98 mmol/L (ref 98–111)
Creatinine, Ser: 1.34 mg/dL — ABNORMAL HIGH (ref 0.61–1.24)
GFR calc Af Amer: 58 mL/min — ABNORMAL LOW (ref >60)
GFR calc non Af Amer: 50 mL/min — ABNORMAL LOW (ref >60)
Glucose, Bld: 143 mg/dL — ABNORMAL HIGH (ref 70–99)
Potassium: 4.3 mmol/L (ref 3.5–5.1)
Sodium: 136 mmol/L (ref 135–145)

## 2019-05-06 LAB — GLUCOSE, CAPILLARY
Glucose-Capillary: 90 mg/dL (ref 70–99)
Glucose-Capillary: 155 mg/dL — ABNORMAL HIGH (ref 70–99)
Glucose-Capillary: 105 mg/dL — ABNORMAL HIGH (ref 70–99)
Glucose-Capillary: 125 mg/dL — ABNORMAL HIGH (ref 70–99)
Glucose-Capillary: 140 mg/dL — ABNORMAL HIGH (ref 70–99)
Glucose-Capillary: 139 mg/dL — ABNORMAL HIGH (ref 70–99)

## 2019-05-06 LAB — DIFFERENTIAL
Abs Immature Granulocytes: 0.05 10*3/uL (ref 0.00–0.07)
Basophils Absolute: 0.1 10*3/uL (ref 0.0–0.1)
Basophils Relative: 1 %
Eosinophils Absolute: 0.5 10*3/uL (ref 0.0–0.5)
Eosinophils Relative: 6 %
Immature Granulocytes: 1 %
Lymphocytes Relative: 19 %
Lymphs Abs: 1.6 10*3/uL (ref 0.7–4.0)
Monocytes Absolute: 0.8 10*3/uL (ref 0.1–1.0)
Monocytes Relative: 10 %
Neutro Abs: 5.3 10*3/uL (ref 1.7–7.7)
Neutrophils Relative %: 63 %

## 2019-05-06 LAB — PREALBUMIN: Prealbumin: 21.1 mg/dL (ref 18–38)

## 2019-05-06 LAB — PHOSPHORUS: Phosphorus: 4.3 mg/dL (ref 2.5–4.6)

## 2019-05-06 LAB — MAGNESIUM: Magnesium: 2.1 mg/dL (ref 1.7–2.4)

## 2019-05-06 LAB — TRIGLYCERIDES: Triglycerides: 100 mg/dL (ref <150)

## 2019-05-06 SURGERY — LAPAROSCOPY, DIAGNOSTIC
Anesthesia: General

## 2019-05-06 MED ORDER — LACTATED RINGERS IV SOLN
INTRAVENOUS | Status: DC
  Administered 2019-05-06 – 2019-05-10 (×4): via INTRAVENOUS

## 2019-05-06 MED ORDER — ONDANSETRON HCL 4 MG/2ML IJ SOLN: 4.0000 mg | Freq: Once | INTRAMUSCULAR | Status: DC | PRN

## 2019-05-06 MED ORDER — MIDAZOLAM HCL 2 MG/2ML IJ SOLN
INTRAMUSCULAR | Status: DC | PRN
  Administered 2019-05-06: 1 mg via INTRAVENOUS

## 2019-05-06 MED ORDER — ONDANSETRON HCL 4 MG/2ML IJ SOLN
INTRAMUSCULAR | Status: AC
  Filled 2019-05-06: qty 2

## 2019-05-06 MED ORDER — DEXAMETHASONE SODIUM PHOSPHATE 10 MG/ML IJ SOLN
INTRAMUSCULAR | Status: DC | PRN
  Administered 2019-05-06: 5 mg via INTRAVENOUS

## 2019-05-06 MED ORDER — FENTANYL CITRATE (PF) 100 MCG/2ML IJ SOLN
INTRAMUSCULAR | Status: AC
  Filled 2019-05-06: qty 2

## 2019-05-06 MED ORDER — LABETALOL HCL 5 MG/ML IV SOLN
INTRAVENOUS | Status: DC | PRN
  Administered 2019-05-06: 5 mg via INTRAVENOUS

## 2019-05-06 MED ORDER — LIDOCAINE HCL (PF) 2 % IJ SOLN
INTRAMUSCULAR | Status: AC
  Filled 2019-05-06: qty 10

## 2019-05-06 MED ORDER — M.V.I. ADULT IV INJ
INTRAVENOUS | Status: AC
  Administered 2019-05-06: 18:00:00 via INTRAVENOUS
  Filled 2019-05-06: qty 1992

## 2019-05-06 MED ORDER — LIDOCAINE HCL (PF) 1 % IJ SOLN
INTRAMUSCULAR | Status: DC | PRN
  Administered 2019-05-06: 5 mL

## 2019-05-06 MED ORDER — PROPOFOL 10 MG/ML IV BOLUS
INTRAVENOUS | Status: AC
  Filled 2019-05-06: qty 20

## 2019-05-06 MED ORDER — PROPOFOL 10 MG/ML IV BOLUS
INTRAVENOUS | Status: DC | PRN
  Administered 2019-05-06: 150 mg via INTRAVENOUS

## 2019-05-06 MED ORDER — ROCURONIUM BROMIDE 100 MG/10ML IV SOLN
INTRAVENOUS | Status: DC | PRN
  Administered 2019-05-06 (×2): 10 mg via INTRAVENOUS
  Administered 2019-05-06: 50 mg via INTRAVENOUS
  Administered 2019-05-06: 30 mg via INTRAVENOUS

## 2019-05-06 MED ORDER — MIDAZOLAM HCL 2 MG/2ML IJ SOLN
INTRAMUSCULAR | Status: AC
  Filled 2019-05-06: qty 2

## 2019-05-06 MED ORDER — PHENYLEPHRINE HCL (PRESSORS) 10 MG/ML IV SOLN
INTRAVENOUS | Status: DC | PRN
  Administered 2019-05-06: 100 ug via INTRAVENOUS
  Administered 2019-05-06: 200 ug via INTRAVENOUS
  Administered 2019-05-06: 100 ug via INTRAVENOUS
  Administered 2019-05-06: 50 ug via INTRAVENOUS

## 2019-05-06 MED ORDER — ENOXAPARIN SODIUM 40 MG/0.4ML ~~LOC~~ SOLN
40.0000 mg | SUBCUTANEOUS | Status: DC
  Administered 2019-05-07 – 2019-05-11 (×5): 40 mg via SUBCUTANEOUS
  Filled 2019-05-06 (×5): qty 0.4

## 2019-05-06 MED ORDER — SUGAMMADEX SODIUM 200 MG/2ML IV SOLN
INTRAVENOUS | Status: DC | PRN
  Administered 2019-05-06: 200 mg via INTRAVENOUS

## 2019-05-06 MED ORDER — SUGAMMADEX SODIUM 200 MG/2ML IV SOLN
INTRAVENOUS | Status: AC
  Filled 2019-05-06: qty 2

## 2019-05-06 MED ORDER — CEFAZOLIN SODIUM-DEXTROSE 1-4 GM/50ML-% IV SOLN
INTRAVENOUS | Status: DC | PRN
  Administered 2019-05-06: 2 g via INTRAVENOUS

## 2019-05-06 MED ORDER — FENTANYL CITRATE (PF) 100 MCG/2ML IJ SOLN: 25.0000 ug | INTRAMUSCULAR | Status: DC | PRN

## 2019-05-06 MED ORDER — FAT EMULSION PLANT BASED 20 % IV EMUL
300.0000 mL | INTRAVENOUS | Status: AC
  Administered 2019-05-06: 300 mL via INTRAVENOUS
  Filled 2019-05-06: qty 300

## 2019-05-06 MED ORDER — FENTANYL CITRATE (PF) 100 MCG/2ML IJ SOLN
INTRAMUSCULAR | Status: DC | PRN
  Administered 2019-05-06 (×3): 50 ug via INTRAVENOUS

## 2019-05-06 MED ORDER — LIDOCAINE HCL (CARDIAC) PF 100 MG/5ML IV SOSY
PREFILLED_SYRINGE | INTRAVENOUS | Status: DC | PRN
  Administered 2019-05-06: 50 mg via INTRAVENOUS

## 2019-05-06 MED ORDER — BUPIVACAINE-EPINEPHRINE (PF) 0.5% -1:200000 IJ SOLN
INTRAMUSCULAR | Status: DC | PRN
  Administered 2019-05-06: 5 mL

## 2019-05-06 SURGICAL SUPPLY — 36 items
BLADE SURG 15 STRL LF DISP TIS (BLADE) ×2 IMPLANT
BLADE SURG 15 STRL SS (BLADE) ×1
CANISTER SUCT 1200ML W/VALVE (MISCELLANEOUS) ×3 IMPLANT
CHLORAPREP W/TINT 26 (MISCELLANEOUS) ×3 IMPLANT
COVER WAND RF STERILE (DRAPES) ×3 IMPLANT
DERMABOND ADVANCED (GAUZE/BANDAGES/DRESSINGS)
DERMABOND ADVANCED .7 DNX12 (GAUZE/BANDAGES/DRESSINGS) ×2 IMPLANT
DRAPE INCISE IOBAN 66X45 STRL (DRAPES) ×1 IMPLANT
DRSG OPSITE POSTOP 3X4 (GAUZE/BANDAGES/DRESSINGS) ×3 IMPLANT
ELECT REM PT RETURN 9FT ADLT (ELECTROSURGICAL) ×3
ELECTRODE REM PT RTRN 9FT ADLT (ELECTROSURGICAL) ×2 IMPLANT
GLOVE BIOGEL PI IND STRL 7.0 (GLOVE) ×2 IMPLANT
GLOVE BIOGEL PI INDICATOR 7.0 (GLOVE) ×3
GLOVE SURG SYN 6.5 ES PF (GLOVE) ×9 IMPLANT
GLOVE SURG SYN 6.5 PF PI (GLOVE) ×2 IMPLANT
GOWN STRL REUS W/ TWL LRG LVL3 (GOWN DISPOSABLE) ×4 IMPLANT
GOWN STRL REUS W/TWL LRG LVL3 (GOWN DISPOSABLE) ×3
GRASPER SUT TROCAR 14GX15 (MISCELLANEOUS) ×1 IMPLANT
IRRIGATION STRYKERFLOW (MISCELLANEOUS) IMPLANT
IRRIGATOR STRYKERFLOW (MISCELLANEOUS)
NEEDLE HYPO 22GX1.5 SAFETY (NEEDLE) ×3 IMPLANT
NEEDLE VERESS 14GA 120MM (NEEDLE) ×3 IMPLANT
PACK LAP CHOLECYSTECTOMY (MISCELLANEOUS) ×3 IMPLANT
PENCIL ELECTRO HAND CTR (MISCELLANEOUS) ×4 IMPLANT
SCISSORS METZENBAUM CVD 33 (INSTRUMENTS) ×3 IMPLANT
SET TUBE SMOKE EVAC HIGH FLOW (TUBING) ×4 IMPLANT
SLEEVE ENDOPATH XCEL 5M (ENDOMECHANICALS) ×8 IMPLANT
SOL ANTI-FOG 6CC FOG-OUT (MISCELLANEOUS) ×2 IMPLANT
SOL FOG-OUT ANTI-FOG 6CC (MISCELLANEOUS)
SPONGE LAP 18X18 RF (DISPOSABLE) ×3 IMPLANT
STAPLER SKIN PROX 35W (STAPLE) ×1 IMPLANT
SUT VICRYL 0 UR6 27IN ABS (SUTURE) ×1 IMPLANT
SYR 10ML LL (SYRINGE) ×2 IMPLANT
TROCAR XCEL BLUNT TIP 100MML (ENDOMECHANICALS) IMPLANT
TROCAR XCEL NON-BLD 11X100MML (ENDOMECHANICALS) ×1 IMPLANT
TROCAR XCEL NON-BLD 5MMX100MML (ENDOMECHANICALS) ×3 IMPLANT

## 2019-05-06 NOTE — Consult Note (Addendum)
PHARMACY - ADULT TOTAL PARENTERAL NUTRITION CONSULT NOTE   Pharmacy Consult for TPN Indication: Intolerance to enteral nutrition  Patient Measurements: Height: 6' (182.9 cm) Weight: 218 lb 12.8 oz (99.2 kg) IBW/kg (Calculated) : 77.6 TPN AdjBW (KG): 83 Body mass index is 29.67 kg/m.  Assessment:  78 y.o. 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 Will initiate TPN today. Pt likely at low refeed risk.   Medications reviewed and include: lovenox, MVI, simethicone, LRS @100ml /hr  Labs reviewed: Na 136(L), K 4.3 wnl, P 4.3 wnl, Mg 2.1 wnl  No additional replenishment warranted at this time  cbgs- 105-143 x 24 hrs, TG's - 100  No current insulin requirements - pharmacy will follow BG's  TPN Access:  PICC placed 6/25 TPN start date: 05/05/2019  Will increase Clinimix 5/20 with electrolytes at to Goal rate 26ml/hr   Will continue 20% lipids @ 13ml/hr x 12 hrs per day  Regimen @ goal rate will provide 2353cal/day, 100g/day protein, 2232ml total volume (with TPN + lipids)  Add MVI daily and trace elements every other day per shortage (M, W, F only)  Will monitor K, Mg and P labs daily until stable; suspect pt at low refeed risk  F/U Keep TPN + IVF rate @ 129ml/hr per MD  (will decrease LR to 20 ml/hr @ 1800 when TPN starts)  Lu Duffel, PharmD, BCPS Clinical Pharmacist 05/06/2019 10:31 AM

## 2019-05-06 NOTE — Op Note (Signed)
Preoperative diagnosis: Small bowel obstruction  Postoperative diagnosis: Same  Procedure: Diagnostic laparoscopy, lysis of adhesions  Anesthesia: GETA  Surgeon: Sung AmabileIsami Sesilia Poucher  Wound Classification: clean contaminated  Specimen: None  Complications: None  Estimated Blood Loss: 3 mL   Indications: Patient is a 78 y.o. male  presented with persistent nausea emesis and abdominal distention status post sigmoid colectomy and end colostomy creation a week ago.  CT scan revealed small bowel obstruction with transition point, imaging reviewed by myself had some concerns about possible mesenteric twisting, therefore taken to the OR today for more definitive diagnosis of the cause of obstruction.  Findings: 1.  No mesenteric twisting 2.  Dense adhesions of the small bowel at the transition point to the adjacent colon ending at the newly created colostomy 3.  No other obvious causes of an obstruction  5. Adequate hemostasis.   Description of procedure: The patient was placed on the operating table in the supine position, left arm tucked. General anesthesia was induced. A time-out was completed verifying correct patient, procedure, site, positioning, and implant(s) and/or special equipment prior to beginning this procedure.  JP drain and ostomy appliance placed in the prior procedure was removed prior to the abdomen prepped and draped in the usual sterile fashion, then the area covered with Ioban to isolate the midline incision wound from the planned incision sites.  Attempt was made to probe the former drain site to see if a 5 mm port was able to be placed directly through it but this was unsuccessful.  The incision at the former drain site was extended until the posterior fascial layer was noted.  At this point a Veress needle was inserted with ease and  abdomen insufflated with carbon dioxide to a pressure of 15 mmHg. The patient tolerated insufflation well.  Needle was removed and a 10 mm port was  placed under Optiview, to prevent minimal leakage from the extended incision site required to visualize the posterior fascia.  The laparoscope was inserted and the abdomen inspected. No injuries from initial trocar placement were noted. One 5mm port and another 5-mm port was then placed in the left lower quadrant and right lower quadrant slightly below the former JP site.  Care was taken to avoid injury to the bladder or inferior epigastric vessels, along with the dilated loops of small bowel seen.    Inspection of the abdominal cavity noted the previous appendectomy adhesions in the right lower quadrant, newly formed ostomy, with the proximal coaling reaching up to the abdominal wall.  Adjacent to this proximal colon was densely adhered dilated small bowel tapering down to a clear transition point within the dense adhesions.  The small bowel itself was not adhered to the abdominal wall but only adhered to what looks like the colonic mesentery.  The collapsed bowel beyond the adhesions was clearly noted away from the ostomy site.  No mesenteric twisting was noted.  Decision was made to proceed with extensive lysis of adhesions at the transition point area to prevent continued trouble with small bowel obstruction.  Over an hour of the entire procedure time was taken to meticulously and carefully dissect the dilated small bowel away from the colon mesentery.  Care was taken to ensure minimal bleeding and damage was done to the mesenteric vessels as well as to the small bowel wall.  Adhesions were all eventually taken down, where the small bowel was able to be freely moved away from the former attachment sites.  Final inspection of the  surrounding bowel was indicated no obvious injury during the procedure, no active bleeding was noted from the colon mesentery.  The colonic wall was uninjured as well.  The 10 mm port site closed with PMI using 0 vicryl under direct vision. The abdomen was allowed to collapse.   Additional Vicryl sutures were then used to close the anterior fascia wall at the former JP site in the right lower quadrant.     All skin incisions then closed with staples and dressed with honeycomb dressing.  Ostomy-appliance was reapplied over the pink and viable ostomy site.  The patient tolerated the procedure well,  awakened from anesthesia and was taken to the postanesthesia care unit in satisfactory condition.  Sponge count and instrument count correct at the end of the procedure.

## 2019-05-06 NOTE — Transfer of Care (Signed)
Immediate Anesthesia Transfer of Care Note  Patient: Mark Blevins  Procedure(s) Performed: LAPAROSCOPY DIAGNOSTIC (N/A ) LAPAROSCOPIC LYSIS OF ADHESIONS  Patient Location: PACU  Anesthesia Type:General  Level of Consciousness: awake, alert  and oriented  Airway & Oxygen Therapy: Patient Spontanous Breathing and Patient connected to face mask oxygen  Post-op Assessment: Report given to RN and Post -op Vital signs reviewed and stable  Post vital signs: Reviewed and stable  Last Vitals:  Vitals Value Taken Time  BP 162/102 05/06/19 1631  Temp 36.6 C 05/06/19 1631  Pulse 86 05/06/19 1631  Resp 18 05/06/19 1631  SpO2 100 % 05/06/19 1631    Last Pain:  Vitals:   05/06/19 1326  TempSrc: Temporal  PainSc: 0-No pain      Patients Stated Pain Goal: 3 (07/61/51 8343)  Complications: No apparent anesthesia complications

## 2019-05-06 NOTE — Anesthesia Post-op Follow-up Note (Signed)
Anesthesia QCDR form completed.        

## 2019-05-06 NOTE — TOC Progression Note (Signed)
Transition of Care [facility]) - Progression Note    Patient Details  Name: Mark Blevins MRN: [id number] Date of Birth: 1940/11/30  Transition of Care [facility]) CM/SW Contact  Beverly Sessions, RN Phone Number: 05/06/2019, 2:30 PM  Clinical Narrative:    Patient diagnostic laparoscopy today.  RNCM requested PT eval when appropriate. Cory with Alvis Lemmings updated.    Expected Discharge Plan: [place] Barriers to Discharge: Continued Medical Work up  Expected Discharge Plan and Services Expected Discharge Plan: [place]   Discharge Planning Services: CM Consult   Living arrangements for the past 2 months: Single Family Home                           HH Arranged: RN [facility] [place] Agency: [place] Date [place]: 04/29/19 Time [place]: 1632 Representative spoke with at [place]: [place] ([place]) Interventions    Readmission Risk Interventions No flowsheet data found.

## 2019-05-06 NOTE — Anesthesia Preprocedure Evaluation (Addendum)
Anesthesia Evaluation  Patient identified by MRN, date of birth, ID band Patient awake    Reviewed: Allergy & Precautions, NPO status , Patient's Chart, lab work & pertinent test results, reviewed documented beta blocker date and time   Airway Mallampati: III  TM Distance: >3 FB     Dental  (+) Upper Dentures, Lower Dentures   Pulmonary former smoker,           Cardiovascular hypertension, Pt. on medications and Pt. on home beta blockers + dysrhythmias Atrial Fibrillation      Neuro/Psych    GI/Hepatic   Endo/Other    Renal/GU      Musculoskeletal   Abdominal   Peds  Hematology   Anesthesia Other Findings EKG checked and ok.  Reproductive/Obstetrics                            Anesthesia Physical Anesthesia Plan  ASA: III  Anesthesia Plan: General   Post-op Pain Management:    Induction: Intravenous  PONV Risk Score and Plan:   Airway Management Planned: Oral ETT  Additional Equipment:   Intra-op Plan:   Post-operative Plan:   Informed Consent: I have reviewed the patients History and Physical, chart, labs and discussed the procedure including the risks, benefits and alternatives for the proposed anesthesia with the patient or authorized representative who has indicated his/her understanding and acceptance.       Plan Discussed with: CRNA  Anesthesia Plan Comments:         Anesthesia Quick Evaluation

## 2019-05-06 NOTE — Progress Notes (Signed)
Nutrition Brief Follow Up Note   INTERVENTION:    Increase Clinimix 5/20 with electrolytes to goal rate 45ml/hr   Continue 20% lipids @ 6ml/hr x 12 hrs per day   Regimen provides 2353cal/day, 100g/day protein, 2253ml volume    Continue MVI daily and trace elements every other day per shortage    Daily weights  Decrease IVF rate to 18ml/hr  ASSESSMENT:   78 y.o. 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   Labs reviewed: K 4.3 wnl, P 4.3 wnl, Mg 2.1 wnl Triglycerides- 100 cbgs- 155, 105 x 24 hrs  Estimated Nutritional Needs:   Kcal:  2100-2400kcal/day  Protein:  105-120g/day  Fluid:  >2L/day  Koleen Distance MS, RD, LDN Pager #- [phone] Office#- [phone] After Hours Pager: [phone]

## 2019-05-06 NOTE — OR Nursing (Signed)
Updated wife of pt - let her know pt is doing well in PACU and will be going to room 222 soon.

## 2019-05-06 NOTE — Care Management Important Message (Signed)
Important Message  Patient Details  Name: Mark Blevins MRN: [id number] Date of Birth: 1941/11/05   Medicare Important Message Given:  Yes     Dannette Barbara 05/06/2019, 10:20 AM

## 2019-05-06 NOTE — Anesthesia Postprocedure Evaluation (Signed)
Anesthesia Post Note  Patient: Mark Blevins  Procedure(s) Performed: LAPAROSCOPY DIAGNOSTIC (N/A ) LAPAROSCOPIC LYSIS OF ADHESIONS  Patient location during evaluation: PACU Anesthesia Type: General Level of consciousness: awake and alert Pain management: pain level controlled Vital Signs Assessment: post-procedure vital signs reviewed and stable Respiratory status: spontaneous breathing, nonlabored ventilation, respiratory function stable and patient connected to nasal cannula oxygen Cardiovascular status: blood pressure returned to baseline and stable Postop Assessment: no apparent nausea or vomiting Anesthetic complications: no     Last Vitals:  Vitals:   05/06/19 1725 05/06/19 1847  BP: (!) 149/99 140/88  Pulse: 99 77  Resp: 16 18  Temp: 36.7 C 36.5 C  SpO2: 97% 98%    Last Pain:  Vitals:   05/06/19 1847  TempSrc: Oral  PainSc:                  [name]

## 2019-05-06 NOTE — Anesthesia Procedure Notes (Signed)
Procedure Name: Intubation Date/Time: 05/06/2019 2:07 PM Performed by: Gentry Fitz, CRNA Pre-anesthesia Checklist: Patient identified, Emergency Drugs available, Suction available and Patient being monitored Patient Re-evaluated:Patient Re-evaluated prior to induction Oxygen Delivery Method: Circle system utilized Preoxygenation: Pre-oxygenation with 100% oxygen Induction Type: IV induction and Cricoid Pressure applied Ventilation: Two handed mask ventilation required Laryngoscope Size: Mac and 4 Grade View: Grade II Tube type: Oral Number of attempts: 1 Airway Equipment and Method: Stylet Placement Confirmation: positive ETCO2 and breath sounds checked- equal and bilateral Secured at: 23 cm Tube secured with: Tape Dental Injury: Teeth and Oropharynx as per pre-operative assessment

## 2019-05-07 ENCOUNTER — Encounter: Payer: Self-pay | Admitting: Surgery

## 2019-05-07 LAB — BASIC METABOLIC PANEL
Anion gap: 8 (ref 5–15)
BUN: 30 mg/dL — ABNORMAL HIGH (ref 8–23)
CO2: 27 mmol/L (ref 22–32)
Calcium: 8 mg/dL — ABNORMAL LOW (ref 8.9–10.3)
Chloride: 100 mmol/L (ref 98–111)
Creatinine, Ser: 1.21 mg/dL (ref 0.61–1.24)
GFR calc Af Amer: >60 mL/min (ref >60)
GFR calc non Af Amer: 57 mL/min — ABNORMAL LOW (ref >60)
Glucose, Bld: 195 mg/dL — ABNORMAL HIGH (ref 70–99)
Potassium: 4.3 mmol/L (ref 3.5–5.1)
Sodium: 135 mmol/L (ref 135–145)

## 2019-05-07 LAB — CBC WITH DIFFERENTIAL/PLATELET
Abs Immature Granulocytes: 0.05 10*3/uL (ref 0.00–0.07)
Basophils Absolute: 0 10*3/uL (ref 0.0–0.1)
Basophils Relative: 0 %
Eosinophils Absolute: 0.1 10*3/uL (ref 0.0–0.5)
Eosinophils Relative: 1 %
HCT: 39.6 % (ref 39.0–52.0)
Hemoglobin: 13.5 g/dL (ref 13.0–17.0)
Immature Granulocytes: 1 %
Lymphocytes Relative: 14 %
Lymphs Abs: 1.2 10*3/uL (ref 0.7–4.0)
MCH: 33.8 pg (ref 26.0–34.0)
MCHC: 34.1 g/dL (ref 30.0–36.0)
MCV: 99 fL (ref 80.0–100.0)
Monocytes Absolute: 0.8 10*3/uL (ref 0.1–1.0)
Monocytes Relative: 10 %
Neutro Abs: 6.4 10*3/uL (ref 1.7–7.7)
Neutrophils Relative %: 74 %
Platelets: 233 10*3/uL (ref 150–400)
RBC: 4 MIL/uL — ABNORMAL LOW (ref 4.22–5.81)
RDW: 11.9 % (ref 11.5–15.5)
WBC: 8.6 10*3/uL (ref 4.0–10.5)
nRBC: 0 % (ref 0.0–0.2)

## 2019-05-07 LAB — GLUCOSE, CAPILLARY
Glucose-Capillary: 130 mg/dL — ABNORMAL HIGH (ref 70–99)
Glucose-Capillary: 187 mg/dL — ABNORMAL HIGH (ref 70–99)
Glucose-Capillary: 190 mg/dL — ABNORMAL HIGH (ref 70–99)
Glucose-Capillary: 289 mg/dL — ABNORMAL HIGH (ref 70–99)

## 2019-05-07 LAB — PHOSPHORUS: Phosphorus: 3.7 mg/dL (ref 2.5–4.6)

## 2019-05-07 MED ORDER — M.V.I. ADULT IV INJ
INTRAVENOUS | Status: AC
  Administered 2019-05-07: 18:00:00 via INTRAVENOUS
  Filled 2019-05-07: qty 1992

## 2019-05-07 MED ORDER — FAT EMULSION PLANT BASED 20 % IV EMUL
300.0000 mL | INTRAVENOUS | Status: AC
  Administered 2019-05-07: 300 mL via INTRAVENOUS
  Filled 2019-05-07: qty 300

## 2019-05-07 NOTE — Plan of Care (Signed)
Ambulated two laps around nursing station today. Minimal NG tube output. Denies pain, no n/v as well.

## 2019-05-07 NOTE — Consult Note (Signed)
PHARMACY - ADULT TOTAL PARENTERAL NUTRITION CONSULT NOTE   Pharmacy Consult for TPN Indication: Intolerance to enteral nutrition  Patient Measurements: Height: 6' (182.9 cm) Weight: 209 lb (94.8 kg) IBW/kg (Calculated) : 77.6 TPN AdjBW (KG): 83 Body mass index is 28.35 kg/m.  Assessment:  78 y.o. 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 Will initiate TPN today. Pt likely at low refeed risk.   Medications reviewed and include: lovenox, MVI, simethicone, LRS @100ml /hr  Labs reviewed: Na 136(L), K 4.3 wnl, P 4.3 wnl, Mg 2.1 wnl  No additional replenishment warranted at this time  cbgs- 105-143 x 24 hrs, TG's - 100  No current insulin requirements - pharmacy will follow BG's  TPN Access:  PICC placed 6/25 TPN start date: 05/05/2019  Will increase Clinimix 5/20 with electrolytes at to Goal rate 41ml/hr   Will continue 20% lipids @ 67ml/hr x 12 hrs per day  Regimen @ goal rate will provide 2353cal/day, 100g/day protein, 2248ml total volume (with TPN + lipids)  Add MVI daily and trace elements every other day per shortage (M, W, F only)  Will monitor K, Mg and P labs daily until stable; suspect pt at low refeed risk  F/U Keep TPN + IVF rate @ 16ml/hr per MD  (will decrease LR to 20 ml/hr @ 1800 when TPN starts)  Olivia Canter, [facility] Clinical Pharmacist 05/07/2019 8:45 AM

## 2019-05-07 NOTE — Progress Notes (Signed)
05/07/2019  Subjective: Patient is 1 Day Post-Op s/p diagnostic laparoscopy and lysis of adhesions for early post-op bowel obstruction.  Patient feels better this morning and reports only soreness.  NG output low overnight, but no bowel function yet.  Vital signs: Temp:  [97.6 F (36.4 C)-98.4 F (36.9 C)] 98.4 F (36.9 C) (06/27 1243) Pulse Rate:  [73-99] 81 (06/27 1243) Resp:  [16-18] 18 (06/27 0533) BP: (121-165)/(70-99) 125/70 (06/27 1243) SpO2:  [97 %-100 %] 98 % (06/27 1243) Weight:  [94.8 kg] 94.8 kg (06/27 0533)   Intake/Output: 06/26 0701 - 06/27 0700 In: 3298.6 [I.V.:3258.6; NG/GT:40] Out: 1458 [Urine:1125; Emesis/NG output:275; Drains:30; Stool:25; Blood:3] Last BM Date: 05/06/19  Physical Exam: Constitutional: No acute distress Abdomen:  Soft, mildly-distended, appropriately tender to palpation.  Incisions clean, dry, intact.  Ostomy with mildly edematous mucosa, no gas or stool in bag.  NG in place.  Labs:  Recent Labs    05/06/19 0557 05/07/19 0450  WBC 8.2 8.6  HGB 14.6 13.5  HCT 42.5 39.6  PLT 251 233   Recent Labs    05/06/19 0557 05/07/19 0450  NA 136 135  K 4.3 4.3  CL 98 100  CO2 27 27  GLUCOSE 143* 195*  BUN 28* 30*  CREATININE 1.34* 1.21  CALCIUM 8.4* 8.0*   No results for input(s): LABPROT, INR in the last 72 hours.  Imaging: No results found.  Assessment/Plan: This is a 78 y.o. male s/p diagnostic laparoscopy and lysis of adhesions.  --Continue NG tube to suction while awaiting return of bowel function. --Continue TPN given lack of po nutrition at this time. --OOB, ambulate with assistance. --DVT and GI proph --OK to clamp NG tube for meds.   Melvyn Neth, [facility]

## 2019-05-08 LAB — CBC WITH DIFFERENTIAL/PLATELET
Abs Immature Granulocytes: 0.08 10*3/uL — ABNORMAL HIGH (ref 0.00–0.07)
Basophils Absolute: 0.1 10*3/uL (ref 0.0–0.1)
Basophils Relative: 1 %
Eosinophils Absolute: 0.6 10*3/uL — ABNORMAL HIGH (ref 0.0–0.5)
Eosinophils Relative: 6 %
HCT: 38.7 % — ABNORMAL LOW (ref 39.0–52.0)
Hemoglobin: 13 g/dL (ref 13.0–17.0)
Immature Granulocytes: 1 %
Lymphocytes Relative: 19 %
Lymphs Abs: 1.7 10*3/uL (ref 0.7–4.0)
MCH: 33.6 pg (ref 26.0–34.0)
MCHC: 33.6 g/dL (ref 30.0–36.0)
MCV: 100 fL (ref 80.0–100.0)
Monocytes Absolute: 0.7 10*3/uL (ref 0.1–1.0)
Monocytes Relative: 8 %
Neutro Abs: 5.9 10*3/uL (ref 1.7–7.7)
Neutrophils Relative %: 65 %
Platelets: 229 10*3/uL (ref 150–400)
RBC: 3.87 MIL/uL — ABNORMAL LOW (ref 4.22–5.81)
RDW: 12.1 % (ref 11.5–15.5)
WBC: 9 10*3/uL (ref 4.0–10.5)
nRBC: 0 % (ref 0.0–0.2)

## 2019-05-08 LAB — GLUCOSE, CAPILLARY
Glucose-Capillary: 130 mg/dL — ABNORMAL HIGH (ref 70–99)
Glucose-Capillary: 143 mg/dL — ABNORMAL HIGH (ref 70–99)
Glucose-Capillary: 161 mg/dL — ABNORMAL HIGH (ref 70–99)
Glucose-Capillary: 162 mg/dL — ABNORMAL HIGH (ref 70–99)

## 2019-05-08 LAB — PHOSPHORUS: Phosphorus: 3.2 mg/dL (ref 2.5–4.6)

## 2019-05-08 MED ORDER — M.V.I. ADULT IV INJ
INTRAVENOUS | Status: AC
  Administered 2019-05-08: 18:00:00 via INTRAVENOUS
  Filled 2019-05-08: qty 1992

## 2019-05-08 MED ORDER — FAT EMULSION PLANT BASED 20 % IV EMUL
300.0000 mL | INTRAVENOUS | Status: AC
  Administered 2019-05-08: 300 mL via INTRAVENOUS
  Filled 2019-05-08: qty 300

## 2019-05-08 MED ORDER — PANTOPRAZOLE SODIUM 40 MG IV SOLR
40.0000 mg | INTRAVENOUS | Status: DC
  Administered 2019-05-08 – 2019-05-10 (×3): 40 mg via INTRAVENOUS
  Filled 2019-05-08 (×3): qty 40

## 2019-05-08 NOTE — Consult Note (Signed)
PHARMACY - ADULT TOTAL PARENTERAL NUTRITION CONSULT NOTE   Pharmacy Consult for TPN Indication: Intolerance to enteral nutrition  Patient Measurements: Height: 6' (182.9 cm) Weight: 206 lb 6.4 oz (93.6 kg) IBW/kg (Calculated) : 77.6 TPN AdjBW (KG): 83 Body mass index is 27.99 kg/m.  Assessment:  78 y.o. 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 Pt likely at low refeed risk.   Medications reviewed and include: lovenox, MVI, simethicone, LRS @100ml /hr  Labs reviewed: Na 136(L), K 4.3 wnl, P 4.3 wnl, Mg 2.1 wnl  No additional replenishment warranted at this time  cbgs- 105-143 x 24 hrs, TG's - 100  No current insulin requirements - pharmacy will follow BG's  TPN Access:  PICC placed 6/25 TPN start date: 05/05/2019  Continue Clinimix 5/20 with electrolytes at Goal rate 12ml/hr   Continue 20% lipids @ 61ml/hr x 12 hrs per day  Regimen @ goal rate will provide 2353cal/day, 100g/day protein, 2270ml total volume (with TPN + lipids)  Add MVI daily and trace elements every other day per shortage (M, W, F only)  Will monitor K, Mg and P labs daily until stable; suspect pt at low refeed risk  F/U Keep TPN + IVF rate @ 176ml/hr per MD  (will decrease LR to 20 ml/hr @ 1800 when TPN starts)  Olivia Canter, [place] Pharmacist 05/08/2019 9:49 AM

## 2019-05-08 NOTE — Progress Notes (Signed)
05/08/2019  Subjective: Patient is 2 Days Post-Op s/p diagnostic laparoscopy and lysis of adhesions.  No acute events.  Patient denies any worsening pain or nausea.  NG output was low over 24 hrs.  Vital signs: Temp:  [97.7 F (36.5 C)-97.9 F (36.6 C)] 97.8 F (36.6 C) (06/28 1200) Pulse Rate:  [67-86] 67 (06/28 1200) Resp:  [16] 16 (06/28 0458) BP: (121-125)/(72-76) 123/74 (06/28 1200) SpO2:  [98 %-100 %] 100 % (06/28 1200) Weight:  [93.6 kg] 93.6 kg (06/28 0458)   Intake/Output: 06/27 0701 - 06/28 0700 In: 3178.7 [I.V.:3128.7; NG/GT:50] Out: 1250 [Urine:1000; Emesis/NG output:250] Last BM Date: 05/06/19  Physical Exam: Constitutional: No acute distress Abdomen:  Soft, non-distended, appropriately tender to palpation.  Incisions are clean, dry, intact.  LLQ ostomy with soft/mushy stool in bag, but no flatus.  NG tube with dark fluid output, low volume.  Labs:  Recent Labs    05/07/19 0450 05/08/19 0644  WBC 8.6 9.0  HGB 13.5 13.0  HCT 39.6 38.7*  PLT 233 229   Recent Labs    05/06/19 0557 05/07/19 0450  NA 136 135  K 4.3 4.3  CL 98 100  CO2 27 27  GLUCOSE 143* 195*  BUN 28* 30*  CREATININE 1.34* 1.21  CALCIUM 8.4* 8.0*   No results for input(s): LABPROT, INR in the last 72 hours.  Imaging: No results found.  Assessment/Plan: This is a 78 y.o. male s/p diagnostic laparoscopy and lysis of adhesions.  --There is now some stool in bag, but no flatus yet, so we'll keep NG tube to suction today.  Hopefully he will have return of bowel function tomorrow.   --Will start IV protonix --Encouraged OOB, ambulate with assistance. --continue [place], [place]

## 2019-05-09 LAB — CBC WITH DIFFERENTIAL/PLATELET
Abs Immature Granulocytes: 0.12 K/uL — ABNORMAL HIGH (ref 0.00–0.07)
Basophils Absolute: 0 K/uL (ref 0.0–0.1)
Basophils Relative: 1 %
Eosinophils Absolute: 0.6 K/uL — ABNORMAL HIGH (ref 0.0–0.5)
Eosinophils Relative: 7 %
HCT: 37.9 % — ABNORMAL LOW (ref 39.0–52.0)
Hemoglobin: 12.7 g/dL — ABNORMAL LOW (ref 13.0–17.0)
Immature Granulocytes: 2 %
Lymphocytes Relative: 22 %
Lymphs Abs: 1.8 K/uL (ref 0.7–4.0)
MCH: 34.2 pg — ABNORMAL HIGH (ref 26.0–34.0)
MCHC: 33.5 g/dL (ref 30.0–36.0)
MCV: 102.2 fL — ABNORMAL HIGH (ref 80.0–100.0)
Monocytes Absolute: 0.7 K/uL (ref 0.1–1.0)
Monocytes Relative: 9 %
Neutro Abs: 4.8 K/uL (ref 1.7–7.7)
Neutrophils Relative %: 59 %
Platelets: 208 K/uL (ref 150–400)
RBC: 3.71 MIL/uL — ABNORMAL LOW (ref 4.22–5.81)
RDW: 12.2 % (ref 11.5–15.5)
WBC: 8 K/uL (ref 4.0–10.5)
nRBC: 0 % (ref 0.0–0.2)

## 2019-05-09 LAB — GLUCOSE, CAPILLARY
Glucose-Capillary: 119 mg/dL — ABNORMAL HIGH (ref 70–99)
Glucose-Capillary: 175 mg/dL — ABNORMAL HIGH (ref 70–99)
Glucose-Capillary: 176 mg/dL — ABNORMAL HIGH (ref 70–99)
Glucose-Capillary: 182 mg/dL — ABNORMAL HIGH (ref 70–99)
Glucose-Capillary: 98 mg/dL (ref 70–99)

## 2019-05-09 LAB — COMPREHENSIVE METABOLIC PANEL
ALT: 41 U/L (ref 0–44)
AST: 31 U/L (ref 15–41)
Albumin: 2.6 g/dL — ABNORMAL LOW (ref 3.5–5.0)
Alkaline Phosphatase: 70 U/L (ref 38–126)
Anion gap: 6 (ref 5–15)
BUN: 31 mg/dL — ABNORMAL HIGH (ref 8–23)
CO2: 27 mmol/L (ref 22–32)
Calcium: 7.9 mg/dL — ABNORMAL LOW (ref 8.9–10.3)
Chloride: 101 mmol/L (ref 98–111)
Creatinine, Ser: 1.07 mg/dL (ref 0.61–1.24)
GFR calc Af Amer: >60 mL/min (ref >60)
GFR calc non Af Amer: >60 mL/min (ref >60)
Glucose, Bld: 176 mg/dL — ABNORMAL HIGH (ref 70–99)
Potassium: 3.7 mmol/L (ref 3.5–5.1)
Sodium: 134 mmol/L — ABNORMAL LOW (ref 135–145)
Total Bilirubin: 0.8 mg/dL (ref 0.3–1.2)
Total Protein: 5.3 g/dL — ABNORMAL LOW (ref 6.5–8.1)

## 2019-05-09 LAB — MAGNESIUM: Magnesium: 2.1 mg/dL (ref 1.7–2.4)

## 2019-05-09 LAB — PREALBUMIN: Prealbumin: 23.3 mg/dL (ref 18–38)

## 2019-05-09 LAB — TRIGLYCERIDES: Triglycerides: 106 mg/dL (ref <150)

## 2019-05-09 LAB — PHOSPHORUS: Phosphorus: 3.4 mg/dL (ref 2.5–4.6)

## 2019-05-09 MED ORDER — TRACE MINERALS CR-CU-MN-SE-ZN 10-1000-500-60 MCG/ML IV SOLN
INTRAVENOUS | Status: AC
  Administered 2019-05-09: 19:00:00 via INTRAVENOUS
  Filled 2019-05-09: qty 1992

## 2019-05-09 MED ORDER — FAT EMULSION PLANT BASED 20 % IV EMUL
300.0000 mL | INTRAVENOUS | Status: DC
  Administered 2019-05-09: 300 mL via INTRAVENOUS
  Filled 2019-05-09: qty 300

## 2019-05-09 NOTE — Consult Note (Signed)
PHARMACY - ADULT TOTAL PARENTERAL NUTRITION CONSULT NOTE   Pharmacy Consult for TPN Indication: Intolerance to enteral nutrition  Patient Measurements: Height: 6' (182.9 cm) Weight: 210 lb (95.3 kg) IBW/kg (Calculated) : 77.6 TPN AdjBW (KG): 83 Body mass index is 28.48 kg/m.  Assessment:  78 y.o. 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

## 2019-05-09 NOTE — Consult Note (Signed)
[place] Nurse ostomy follow up Stoma type/location: LLQ, end colostomy  Stomal assessment/size: 1 1/4" slightly oval, flush,  Pink, moist  Peristomal assessment: intact, very slight mucocutaneous separation from 11-1 o'clock   Treatment options for stomal/peristomal skin: using 2" barrier ring Output pasty brown Ostomy pouching: 2pc.  Education provided:  Demonstrated pouch change (cutting new skin barrier, measuring stoma, cleaning peristomal skin and stoma, use of barrier ring) Education on emptying when 1/3 to 1/2 full and how to empty Demonstrated "burping" flatus from pouch Demonstrated use of wick to clean spout  Discussed bathing, diet, gas, medication use, constipation  Answered patient/family questions:   Patient asked appropriate questions related to travel, showering, diet. Wife has been in for teaching with patient.  Wife took educational materials home Patient practiced opening and closing lock and roll successfully. Practiced attaching pouch to skin barrier and burping pouch.  Able to open spout, discussed cleaning spout with wick  Patient is nervous to go home when I arrived but seems more comfortable.   Patient would benefit from [facility]   3 pouches, 3 skin barriers and 3 barrier rings in the room   Portal Nurse will follow along with you for continued support with ostomy teaching and care [place] MSN, RN, [place], CNS, CWON-AP [phone]       Enrolled patient in [place] Discharge program: Yes, previously

## 2019-05-09 NOTE — Plan of Care (Signed)
Patient is doing well today.  NG tube was clamped this am and patient has not had any nausea or vomiting since.  He is tolerating ice chips.  Patient has ambulated twice around the nurses station. Colostomy is still having output.  Incisions are C/D/I.  No significant changes.  Patient stated he has already talked to his son and wife today and updated them so he said I did not need to call them.

## 2019-05-09 NOTE — Care Management Important Message (Signed)
Important Message  Patient Details  Name: Mark Blevins MRN: [id number] Date of Birth: 15-Jun-1941   Medicare Important Message Given:  Yes     Dannette Barbara 05/09/2019, 2:05 PM

## 2019-05-10 LAB — CBC WITH DIFFERENTIAL/PLATELET
Abs Immature Granulocytes: 0.1 10*3/uL — ABNORMAL HIGH (ref 0.00–0.07)
Basophils Absolute: 0.1 10*3/uL (ref 0.0–0.1)
Basophils Relative: 1 %
Eosinophils Absolute: 0.5 10*3/uL (ref 0.0–0.5)
Eosinophils Relative: 8 %
HCT: 35.5 % — ABNORMAL LOW (ref 39.0–52.0)
Hemoglobin: 12 g/dL — ABNORMAL LOW (ref 13.0–17.0)
Immature Granulocytes: 1 %
Lymphocytes Relative: 28 %
Lymphs Abs: 2 10*3/uL (ref 0.7–4.0)
MCH: 34.1 pg — ABNORMAL HIGH (ref 26.0–34.0)
MCHC: 33.8 g/dL (ref 30.0–36.0)
MCV: 100.9 fL — ABNORMAL HIGH (ref 80.0–100.0)
Monocytes Absolute: 0.6 10*3/uL (ref 0.1–1.0)
Monocytes Relative: 9 %
Neutro Abs: 3.9 10*3/uL (ref 1.7–7.7)
Neutrophils Relative %: 53 %
Platelets: 208 10*3/uL (ref 150–400)
RBC: 3.52 MIL/uL — ABNORMAL LOW (ref 4.22–5.81)
RDW: 12.2 % (ref 11.5–15.5)
WBC: 7.2 10*3/uL (ref 4.0–10.5)
nRBC: 0 % (ref 0.0–0.2)

## 2019-05-10 LAB — GLUCOSE, CAPILLARY
Glucose-Capillary: 108 mg/dL — ABNORMAL HIGH (ref 70–99)
Glucose-Capillary: 133 mg/dL — ABNORMAL HIGH (ref 70–99)
Glucose-Capillary: 133 mg/dL — ABNORMAL HIGH (ref 70–99)
Glucose-Capillary: 158 mg/dL — ABNORMAL HIGH (ref 70–99)
Glucose-Capillary: 170 mg/dL — ABNORMAL HIGH (ref 70–99)

## 2019-05-10 LAB — PHOSPHORUS: Phosphorus: 3.5 mg/dL (ref 2.5–4.6)

## 2019-05-10 MED ORDER — M.V.I. ADULT IV INJ
INTRAVENOUS | Status: DC
  Administered 2019-05-10: 19:00:00 via INTRAVENOUS
  Filled 2019-05-10: qty 960

## 2019-05-10 MED ORDER — ENSURE ENLIVE PO LIQD
237.0000 mL | Freq: Two times a day (BID) | ORAL | Status: DC
  Administered 2019-05-10 – 2019-05-11 (×2): 237 mL via ORAL

## 2019-05-10 NOTE — Progress Notes (Addendum)
Nutrition Follow Up Note   DOCUMENTATION CODES:   Not applicable  INTERVENTION:    Decrease Clinimix 5/20 with electrolytes to rate of 57m/hr   Disontinue 20% lipids   Continue MVI daily and trace elements every other day per shortage  Add Ensure Enlive po BID, each supplement provides 350 kcal and 20 grams of protein  NUTRITION DIAGNOSIS:   Inadequate oral intake related to acute illness as evidenced by NPO status  GOAL:   Patient will meet greater than or equal to 90% of their needs  -met with TPN   MONITOR:   PO intake, Supplement acceptance, Labs, Weight trends, Skin, I & O's, TPN  ASSESSMENT:   78y.o. 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  Pt able to have NGT removed, initiated on clear liquid diet yesterday and advanced to regular diet today. Will begin to wean TPN. Will add Ensure supplements. Pt is having stool output in colostomy; 1528moutput. Pt is ambulating. Refeed labs stable. Per chart, pt down ~8lbs since admit.   Medications reviewed and include: lovenox, insulin, protonix, simethicone, LRS _0 /hr  Labs reviewed: Na 134(L), K 3.7 wnl, P Mg 2.1 wnl- 6/29 P 3.5 wnl Pre-albumin 23.3- 6/29 Triglycerides- 106- 6/29 cbgs- 170, 158, 133 x 24 hrs  Diet Order:   Diet Order            Diet regular Room service appropriate? Yes; Fluid consistency: Thin  Diet effective now             EDUCATION NEEDS:   No education needs have been identified at this time  Skin:  Skin Assessment: Reviewed RN Assessment(incision abdomen)  Last BM:  6/30- type 7  Height:   Ht Readings from Last 1 Encounters:  05/06/19 6' (1.829 m)    Weight:   Wt Readings from Last 1 Encounters:  05/09/19 95.3 kg    Ideal Body Weight:  80.9 kg  BMI:  Body mass index is 28.48 kg/m.  Estimated Nutritional Needs:   Kcal:   2100-2400kcal/day  Protein:  105-120g/day  Fluid:  >2L/day  CaKoleen DistanceS, RD, LDN Pager #- [phone]- [phone] Hours Pager: [phone]

## 2019-05-10 NOTE — Progress Notes (Signed)
Subjective:  CC: Mark Blevins is a 78 y.o. 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  HPI: Tolerated clears, remains in good spirits  ROS:  General: Denies weight loss, weight gain, fatigue, fevers, chills, and night sweats. Heart: Denies chest pain, palpitations, racing heart, irregular heartbeat, leg pain or swelling, and decreased activity tolerance. Respiratory: Denies breathing difficulty, shortness of breath, wheezing, cough, and sputum. GI: Denies change in appetite, heartburn, nausea, vomiting, constipation, diarrhea, and blood in stool. GU: Denies difficulty urinating, pain with urinating, urgency, frequency, blood in urine.   Objective:   Temp:  [97.7 F (36.5 C)-98 F (36.7 C)] 97.7 F (36.5 C) (06/30 1147) Pulse Rate:  [76-88] 76 (06/30 1147) Resp:  [16-19] 16 (06/30 1147) BP: (110-115)/(72-78) 112/72 (06/30 1147) SpO2:  [99 %-100 %] 100 % (06/30 1147)     Height: 6' (182.9 cm) Weight: 95.3 kg BMI (Calculated): 28.47   Intake/Output this shift:   Intake/Output Summary (Last 24 hours) at 05/10/2019 1718 Last data filed at 05/10/2019 1626 Gross per 24 hour  Intake 3869.41 ml  Output 1700 ml  Net 2169.41 ml  NG with bilious output  JP with serosanguinous  Constitutional :  alert, cooperative, appears stated age and no distress  Respiratory:  clear to auscultation bilaterally  Cardiovascular:  regular rate and rhythm  Gastrointestinal: Soft, no guarding, no distention.  Ostomy yet again with stool..    Skin: Cool and moist.   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  Doing well after second surgery, tolerating clears.  Advance to regular and finish TPN prior to d/c.  Likely tomorrow am

## 2019-05-10 NOTE — Consult Note (Signed)
PHARMACY - ADULT TOTAL PARENTERAL NUTRITION CONSULT NOTE   Pharmacy Consult for TPN Indication: Intolerance to enteral nutrition  Patient Measurements: Height: 6' (182.9 cm) Weight: 210 lb (95.3 kg) IBW/kg (Calculated) : 77.6 TPN AdjBW (KG): 83 Body mass index is 28.48 kg/m.  Assessment:  78 y.o. 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  NG tube was clamped 6/29 and patient has not had any nausea or vomiting since.  He is tolerating ice chips.. Colostomy is still having output.    TPN Access:  PICC placed 6/25 TPN start date: 05/05/2019  Plan   He was started on a regular diet and we are attempting to wean the TPN  decrease Clinimix 5/20 with electrolytes to 78ml/hr   D/C 20% lipids   Sensitive SSI: last 24h 4u SSI  No additional electrolyte replacement warranted at this time  Add MVI daily and trace elements every other day per shortage (M, W, F only)  continue LR at 20 ml/hr   Dallie Piles, PharmD Clinical Pharmacist 05/10/2019 8:20 AM

## 2019-05-10 NOTE — Progress Notes (Signed)
Subjective:  CC: Mark Blevins is a 78 y.o. 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 Heart: Denies chest pain, palpitations, racing heart, irregular heartbeat, leg pain or swelling, and decreased activity tolerance. Respiratory: Denies breathing difficulty, shortness of breath, wheezing, cough, and sputum. GI: Denies change in appetite, heartburn, nausea, vomiting, constipation, diarrhea, and blood in stool. GU: Denies difficulty urinating, pain with urinating, urgency, frequency, blood in urine.   Objective:   Temp:  [97.7 F (36.5 C)-98 F (36.7 C)] 97.7 F (36.5 C) (06/30 1147) Pulse Rate:  [76-88] 76 (06/30 1147) Resp:  [16-19] 16 (06/30 1147) BP: (110-115)/(72-78) 112/72 (06/30 1147) SpO2:  [99 %-100 %] 100 % (06/30 1147)     Height: 6' (182.9 cm) Weight: 95.3 kg BMI (Calculated): 28.47   Intake/Output this shift:   Intake/Output Summary (Last 24 hours) at 05/10/2019 1716 Last data filed at 05/10/2019 1626 Gross per 24 hour  Intake 3869.41 ml  Output 1700 ml  Net 2169.41 ml  NG with bilious output  JP with serosanguinous  Constitutional :  alert, cooperative, appears stated age and no distress  Respiratory:  clear to auscultation bilaterally  Cardiovascular:  regular rate and rhythm  Gastrointestinal: Soft, no guarding, no distention.  Ostomy yet again with stool..    Skin: Cool and moist.   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  Doing well after second surgery, will remove NG and start on clears.

## 2019-05-11 LAB — CBC WITH DIFFERENTIAL/PLATELET
Abs Immature Granulocytes: 0.1 10*3/uL — ABNORMAL HIGH (ref 0.00–0.07)
Basophils Absolute: 0 10*3/uL (ref 0.0–0.1)
Basophils Relative: 1 %
Eosinophils Absolute: 0.4 10*3/uL (ref 0.0–0.5)
Eosinophils Relative: 6 %
HCT: 35.2 % — ABNORMAL LOW (ref 39.0–52.0)
Hemoglobin: 12.1 g/dL — ABNORMAL LOW (ref 13.0–17.0)
Immature Granulocytes: 2 %
Lymphocytes Relative: 32 %
Lymphs Abs: 1.8 10*3/uL (ref 0.7–4.0)
MCH: 34.3 pg — ABNORMAL HIGH (ref 26.0–34.0)
MCHC: 34.4 g/dL (ref 30.0–36.0)
MCV: 99.7 fL (ref 80.0–100.0)
Monocytes Absolute: 0.6 10*3/uL (ref 0.1–1.0)
Monocytes Relative: 10 %
Neutro Abs: 2.9 10*3/uL (ref 1.7–7.7)
Neutrophils Relative %: 49 %
Platelets: 199 10*3/uL (ref 150–400)
RBC: 3.53 MIL/uL — ABNORMAL LOW (ref 4.22–5.81)
RDW: 12.3 % (ref 11.5–15.5)
WBC: 5.8 10*3/uL (ref 4.0–10.5)
nRBC: 0 % (ref 0.0–0.2)

## 2019-05-11 LAB — PHOSPHORUS: Phosphorus: 3.6 mg/dL (ref 2.5–4.6)

## 2019-05-11 LAB — GLUCOSE, CAPILLARY: Glucose-Capillary: 113 mg/dL — ABNORMAL HIGH (ref 70–99)

## 2019-05-11 MED ORDER — IBUPROFEN 800 MG PO TABS: 800.0000 mg | ORAL_TABLET | Freq: Three times a day (TID) | ORAL | 0 refills | Status: DC | PRN

## 2019-05-11 MED ORDER — ACETAMINOPHEN 325 MG PO TABS: 650.0000 mg | ORAL_TABLET | Freq: Three times a day (TID) | ORAL | 0 refills | Status: AC | PRN

## 2019-05-11 MED ORDER — DOCUSATE SODIUM 100 MG PO CAPS: 100.0000 mg | ORAL_CAPSULE | Freq: Two times a day (BID) | ORAL | 0 refills | Status: AC | PRN

## 2019-05-11 NOTE — Consult Note (Addendum)
[place] Nurse ostomy follow up Stoma type/location: LLQ, end colostomy  Stomal assessment/size: 1 1/4" slightly oval, flush,  Pink, moist  Peristomal assessment: intact Output small amt pasty brown stool Ostomy pouching: 2pc. With barrier ring to attempt to maintain a seal. Education provided: Pt assisted with pouch change using a mirror.  He was able to open and close to empty.  Reviewed pouching routines and ordering supplies.  5 extra sets of pouches, wafers, and barrier rings left at the bedside for discharge home.  Patient asked appropriate questions related to travel, showering, diet. Wife has been in for teaching with patient previously and took Scientist, clinical (histocompatibility and immunogenetics) home.  Pt was previously placed on SS program. Recommend home health assistance after discharge.  Julien Girt MSN, RN, [place], [place], [place]

## 2019-05-11 NOTE — Discharge Summary (Signed)
Physician Discharge Summary  Patient ID: Mark Blevins MRN: [id number] DOB/AGE: [birth year] 78 y.o.  Admit date: 04/27/2019 Discharge date: 05/11/2019  Admission Diagnoses: Recurrent sigmoid volvulus  Discharge Diagnoses:  Same as above plus postoperative small bowel obstruction, malnutrition  Discharged Condition: good  Hospital Course: Admitted for above.  Underwent urgent open sigmoidectomy with end colostomy formation.  Please see operative note for further details.  Postop patient did not have a return of bowel function, so had NG tube placed with decompression.  Ostomy output noted few days later, so the NG tube was removed and diet was advanced.  However, patient resulted in repeat emesis and distention, which prompted a further work-up and CT scan.  PICC line was placed at this time to start TPN due to the prolonged n.p.o. status.  CT scan did not show an ileus but actually showed a small bowel obstruction, with imaging concerning for possible mesenteric twisting.  Extensive discussion was had with patient about returning to the OR for diagnostic laparoscopy, for which she was eventually agreeable.  Please see progress notes for specific details regarding the conversation.  Patient underwent a diagnostic laparoscopy which showed a very early formation of adhesions of the small bowel to the newly formed colostomy site, resulting in a acute obstruction.  The adhesions were taken down as noted in the second operative note.  Postop, patient NG output remained very low, ostomy remains productive.  NG was eventually discontinued and patient was able to tolerate a diet advancement without any issues this time.  At time of discharge, vitals remained stable, labs normalized, patient is continued able to tolerate a regular diet, with healthy stool output, and no complaints of nausea vomiting and or bloating.  Midline staples from the initial procedure was removed.  He will follow-up in our office next week  for wound recheck and hopefully removal of the remaining staples from the second surgery.  In preparation for discharge, wound ostomy care was consulted and patient educated regarding care, he will be going home with home health for additional support until he is able to manage the ostomy on his own along with his wife. Consults: Wound ostomy nurse, nutritionist  Discharge Exam: Blood pressure 98/77, pulse 72, temperature (!) 97.4 F (36.3 C), temperature source Oral, resp. rate 18, height 6' (1.829 m), weight 95.3 kg, SpO2 100 %. General appearance: alert, cooperative and no distress GI: soft, non-tender; bowel sounds normal; no masses,  no organomegaly, nondistended, ostomy pink, patent, with productive brown stool.  Staple line at the port sites remain clean dry and intact.  Midline incision from initial operation staples removed without any issues.  Incision remains intact.  Disposition:  Discharge disposition: 01-Home or Self Care       Discharge Instructions    Discharge patient   Complete by: As directed    Discharge disposition: 01-Home or Self Care   Discharge patient date: 05/11/2019     Allergies as of 05/11/2019      Reactions   Codeine    n/v   Morphine And Related Nausea And Vomiting      Medication List    STOP taking these medications   nitrofurantoin (macrocrystal-monohydrate) 100 MG capsule Commonly known as: Macrobid     TAKE these medications   acetaminophen 325 MG tablet Commonly known as: Tylenol Take 2 tablets (650 mg total) by mouth every 8 (eight) hours as needed for mild pain.   amLODipine 10 MG tablet Commonly known as: NORVASC TK 1  T PO D   aspirin 81 MG chewable tablet Chew 81 mg by mouth daily.   cloNIDine 0.1 MG tablet Commonly known as: CATAPRES TK 1 T PO D   docusate sodium 100 MG capsule Commonly known as: Colace Take 1 capsule (100 mg total) by mouth 2 (two) times daily as needed for up to 10 days for mild constipation.    ibuprofen 800 MG tablet Commonly known as: ADVIL Take 1 tablet (800 mg total) by mouth every 8 (eight) hours as needed for mild pain or moderate pain.   labetalol 200 MG tablet Commonly known as: NORMODYNE TK 1 T PO D   multivitamin with minerals Tabs tablet Take 1 tablet by mouth daily.      Follow-up Information    Journiee Feldkamp, DO Follow up in 1 week(s).   Specialty: Surgery Why: for wound check and staple removal Contact information: [address] [place] [address] [phone]            Total time spent arranging discharge was >5330min. Signed: Sung Amabilesami Marcelis Wissner 05/11/2019, 8:42 AM

## 2019-05-11 NOTE — TOC Transition Note (Signed)
Transition of Care [facility]) - CM/SW Discharge Note   Patient Details  Name: NIKI COSMAN MRN: [id number] Date of Birth: [birth year]  Transition of Care [facility]) CM/SW Contact:  Beverly Sessions, RN Phone Number: 05/11/2019, 10:57 AM   Clinical Narrative:    Patient to discharge home today [facility] with [facility] notified of discharge Bedside RN to send home some ostomy supplies with patient Patient very gracious    Final next level of care: [place] Barriers to Discharge: Barriers Resolved   Patient Goals and CMS Choice Patient states their goals for this hospitalization and ongoing recovery are:: "You have no idea how ready I am to get home" CMS Medicare.gov Compare Post Acute Care list provided to:: Patient Choice offered to / list presented to : Patient  Discharge Placement                       Discharge Plan and Services   Discharge Planning Services: CM Consult                      HH Arranged: RN [facility] Agency: [place] Date [place]: 05/11/19 Time [place]: 1884 Representative spoke with at [place]: [place] ([place]) Interventions     Readmission Risk Interventions No flowsheet data found.

## 2019-05-11 NOTE — Discharge Instructions (Signed)
Ileus  Ileus is a condition that happens when the intestines, which are also called bowels, stop working correctly. The intestines are hollow organs that digest food after the food leaves the stomach. These organs are long, muscular tubes that connect the stomach to the rectum. When ileus occurs, the muscular contractions that cause food to move through the intestines do not happen as they normally would. If the intestines stop working, food cannot pass through to get digested. This condition is a serious problem that usually requires hospitalization. It can cause symptoms such as nausea, abdominal pain, and bloating. Ileus can last from a few hours to a few days. What are the causes? This condition may be caused by:  Surgery on the abdomen.  An infection or inflammation in the abdomen. This includes inflammation of the lining of the abdomen (peritonitis).  Infection or inflammation in other parts of the body, such as pneumonia or pancreatitis.  Passage of gallstones or kidney stones.  Damage to the nerves or blood vessels that go to the intestines.  A collection of blood within the abdominal cavity.  Imbalance in the salts in the blood (electrolytes).  Injury to the brain or spinal cord.  Medicines. Many medicines, including strong pain medicines, can cause ileus or make it worse. If the intestines stop working because of a blockage, that is a different condition that is called a bowel obstruction. What are the signs or symptoms? Symptoms of this condition include:  Bloating of the abdomen.  Pain or discomfort in the abdomen.  Poor appetite.  Nausea and vomiting.  Lack of normal bowel sounds, such as "growling" in the stomach. How is this diagnosed? This condition may be diagnosed with:  A physical exam and medical history.  X-rays or a CT scan of the abdomen. You may also have other tests to help find the cause of the condition. How is this treated? This condition  may be treated by:  Resting the intestines until they start to work again. This is often done by: ? Stopping oral intake of food and drink. You will be given fluid through an IV to prevent dehydration. ? Placing a small tube (nasogastric tube or NG tube) that is passed through your nose and into your stomach. The tube is attached to a suction device and keeps the stomach emptied out. This allows the bowels to rest and helps to reduce nausea and vomiting.  Correcting any electrolyte imbalance by giving supplements in the IV fluid.  Stopping any medicines that might make ileus worse.  Treating any condition that may have caused ileus. Follow these instructions at home: Eating and drinking   Follow instructions from your health care provider about: ? What to eat and drink. You may be told to start eating a bland diet. Over time, you may slowly resume a more normal, healthy diet. ? How much to eat and drink. You should eat small meals often and stop eating when you feel full.  Avoid alcohol. General instructions  Take over-the-counter and prescription medicines only as told by your health care provider.  Rest as told by your health care provider.  Avoid sitting for a long time without moving. Get up to take short walks every 1-2 hours. Ask for help if you feel weak or unsteady.  Keep all follow-up visits as told by your health care provider. This is important. Contact a health care provider if:  You have nausea, vomiting, or abdominal discomfort.  You have a fever. Get  help right away if:  You have severe abdominal pain or bloating.  You cannot eat or drink without vomiting. Summary  Ileus is a condition that happens when the intestines, which are also called bowels, stop working correctly.  When ileus occurs, the muscular contractions that cause food to move through the intestines do not happen as they normally would.  Ileus can cause symptoms such as nausea, abdominal pain,  and bloating.  Treatment may involve getting IV fluids and having a nasogastric tube placed to keep your stomach emptied out until the intestines start working again. This information is not intended to replace advice given to you by your health care provider. Make sure you discuss any questions you have with your health care provider. Document Released: 10/30/2003 Document Revised: 02/22/2018 Document Reviewed: 02/22/2018 Elsevier Patient Education  2020 [employer] Laparoscopic Colectomy, Care After This sheet gives you information about how to care for yourself after your procedure. Your health care provider may also give you more specific instructions. If you have problems or questions, contact your health care provider. What can I expect after the procedure? After your procedure, it is common to have the following:  Pain in your abdomen, especially in the incision areas. You will be given medicine to control the pain.  Tiredness. This is a normal part of the recovery process. Your energy level will return to normal over the next several weeks.  Changes in your bowel movements, such as constipation or needing to go more often. Talk with your health care provider about how to manage this. Follow these instructions at home: Medicines   tylenol and advil as needed for discomfort.  Please alternate between the two every four hours as needed for pain.     325-650mg  every 8hrs to max of 3000mg /24hrs for the tylenol.     Advil up to 800mg  per dose every 8hrs as needed for pain.    Do not drive or use heavy machinery while taking prescription pain medicine.  Do not drink alcohol while taking prescription pain medicine.  If you were prescribed an antibiotic medicine, use it as told by your health care provider. Do not stop using the antibiotic even if you start to feel better. Incision care     Follow instructions from your health care provider about how to take care of your incision  areas. Make sure you: ? Keep your incisions clean and dry. ? Wash your hands with soap and water before and after applying medicine to the areas, and before and after changing your bandage (dressing). If soap and water are not available, use hand sanitizer. ? Change your dressing as told by your health care provider. ? Leave stitches (sutures), skin glue, or adhesive strips in place. These skin closures may need to stay in place for 2 weeks or longer. If adhesive strip edges start to loosen and curl up, you may trim the loose edges. Do not remove adhesive strips completely unless your health care provider tells you to do that.  Do not wear tight clothing over the incisions. Tight clothing may rub and irritate the incision areas, which may cause the incisions to open.  Do not take baths, swim, or use a hot tub until your health care provider approves. OK TO SHOWER.    Check your incision area every day for signs of infection. Check for: ? More redness, swelling, or pain. ? More fluid or blood. ? Warmth. ? Pus or a bad smell. Activity  Avoid lifting  anything that is heavier than 10 lb (4.5 kg) for 2 weeks or until your health care provider says it is okay.  You may resume normal activities as told by your health care provider. Ask your health care provider what activities are safe for you.  Take rest breaks during the day as needed. Eating and drinking  Follow instructions from your health care provider about what you can eat after surgery.  To prevent or treat constipation while you are taking prescription pain medicine, your health care provider may recommend that you: ? Drink enough fluid to keep your urine clear or pale yellow. ? Take over-the-counter or prescription medicines. ? Eat foods that are high in fiber, such as fresh fruits and vegetables, whole grains, and beans. ? Limit foods that are high in fat and processed sugars, such as fried and sweet foods. General  instructions  Ask your health care provider when you will need an appointment to get your sutures or staples removed.  Keep all follow-up visits as told by your health care provider. This is important. Contact a health care provider if:  You have more redness, swelling, or pain around your incisions.  You have more fluid or blood coming from the incisions.  Your incisions feel warm to the touch.  You have pus or a bad smell coming from your incisions or your dressing.  You have a fever.  You have an incision that breaks open (edges not staying together) after sutures or staples have been removed. Get help right away if:  You develop a rash.  You have chest pain or difficulty breathing.  You have pain or swelling in your legs.  You feel light-headed or you faint.  Your abdomen swells (becomes distended).  You have nausea or vomiting.  You have blood in your stool (feces). This information is not intended to replace advice given to you by your health care provider. Make sure you discuss any questions you have with your health care provider. Document Released: 05/16/2005 Document Revised: 07/16/2018 Document Reviewed: 07/28/2016 Elsevier Interactive Patient Education  2019 [employer].

## 2019-05-12 DIAGNOSIS — Z9181 History of falling: Secondary | ICD-10-CM | POA: Diagnosis not present

## 2019-05-12 DIAGNOSIS — F17220 Nicotine dependence, chewing tobacco, uncomplicated: Secondary | ICD-10-CM | POA: Diagnosis not present

## 2019-05-12 DIAGNOSIS — I10 Essential (primary) hypertension: Secondary | ICD-10-CM | POA: Diagnosis not present

## 2019-05-12 DIAGNOSIS — Z48815 Encounter for surgical aftercare following surgery on the digestive system: Secondary | ICD-10-CM | POA: Diagnosis not present

## 2019-05-12 DIAGNOSIS — Z9049 Acquired absence of other specified parts of digestive tract: Secondary | ICD-10-CM | POA: Diagnosis not present

## 2019-05-12 DIAGNOSIS — N400 Enlarged prostate without lower urinary tract symptoms: Secondary | ICD-10-CM | POA: Diagnosis not present

## 2019-05-12 DIAGNOSIS — Z433 Encounter for attention to colostomy: Secondary | ICD-10-CM | POA: Diagnosis not present

## 2019-05-12 DIAGNOSIS — Z8701 Personal history of pneumonia (recurrent): Secondary | ICD-10-CM | POA: Diagnosis not present

## 2019-05-12 DIAGNOSIS — Z7982 Long term (current) use of aspirin: Secondary | ICD-10-CM | POA: Diagnosis not present

## 2019-06-14 DIAGNOSIS — N476 Balanoposthitis: Secondary | ICD-10-CM | POA: Diagnosis not present

## 2019-06-14 DIAGNOSIS — I517 Cardiomegaly: Secondary | ICD-10-CM | POA: Diagnosis not present

## 2019-06-14 DIAGNOSIS — Z9049 Acquired absence of other specified parts of digestive tract: Secondary | ICD-10-CM | POA: Diagnosis not present

## 2019-06-14 DIAGNOSIS — E8881 Metabolic syndrome: Secondary | ICD-10-CM | POA: Diagnosis not present

## 2019-06-21 DIAGNOSIS — Z48815 Encounter for surgical aftercare following surgery on the digestive system: Secondary | ICD-10-CM | POA: Diagnosis not present

## 2019-06-21 DIAGNOSIS — Z7982 Long term (current) use of aspirin: Secondary | ICD-10-CM | POA: Diagnosis not present

## 2019-06-21 DIAGNOSIS — Z9181 History of falling: Secondary | ICD-10-CM | POA: Diagnosis not present

## 2019-06-21 DIAGNOSIS — Z8701 Personal history of pneumonia (recurrent): Secondary | ICD-10-CM | POA: Diagnosis not present

## 2019-06-21 DIAGNOSIS — Z9049 Acquired absence of other specified parts of digestive tract: Secondary | ICD-10-CM | POA: Diagnosis not present

## 2019-06-21 DIAGNOSIS — Z433 Encounter for attention to colostomy: Secondary | ICD-10-CM | POA: Diagnosis not present

## 2019-06-21 DIAGNOSIS — N400 Enlarged prostate without lower urinary tract symptoms: Secondary | ICD-10-CM | POA: Diagnosis not present

## 2019-06-21 DIAGNOSIS — I10 Essential (primary) hypertension: Secondary | ICD-10-CM | POA: Diagnosis not present

## 2019-06-21 DIAGNOSIS — F17220 Nicotine dependence, chewing tobacco, uncomplicated: Secondary | ICD-10-CM | POA: Diagnosis not present

## 2019-07-14 DIAGNOSIS — Z933 Colostomy status: Secondary | ICD-10-CM | POA: Diagnosis not present

## 2019-08-10 DIAGNOSIS — Z933 Colostomy status: Secondary | ICD-10-CM | POA: Diagnosis not present

## 2019-08-12 DIAGNOSIS — S31819A Unspecified open wound of right buttock, initial encounter: Secondary | ICD-10-CM | POA: Diagnosis not present

## 2019-08-16 DIAGNOSIS — Z933 Colostomy status: Secondary | ICD-10-CM | POA: Diagnosis not present

## 2019-08-16 DIAGNOSIS — K562 Volvulus: Secondary | ICD-10-CM | POA: Diagnosis not present

## 2019-09-09 DIAGNOSIS — Z933 Colostomy status: Secondary | ICD-10-CM | POA: Diagnosis not present

## 2019-09-13 DIAGNOSIS — E8881 Metabolic syndrome: Secondary | ICD-10-CM | POA: Diagnosis not present

## 2019-09-13 DIAGNOSIS — N476 Balanoposthitis: Secondary | ICD-10-CM | POA: Diagnosis not present

## 2019-09-13 DIAGNOSIS — I8312 Varicose veins of left lower extremity with inflammation: Secondary | ICD-10-CM | POA: Diagnosis not present

## 2019-09-13 DIAGNOSIS — I517 Cardiomegaly: Secondary | ICD-10-CM | POA: Diagnosis not present

## 2019-10-12 DIAGNOSIS — Z933 Colostomy status: Secondary | ICD-10-CM | POA: Diagnosis not present

## 2019-11-15 DIAGNOSIS — K435 Parastomal hernia without obstruction or  gangrene: Secondary | ICD-10-CM | POA: Diagnosis not present

## 2019-11-23 DIAGNOSIS — Z933 Colostomy status: Secondary | ICD-10-CM | POA: Diagnosis not present

## 2019-12-21 ENCOUNTER — Ambulatory Visit: Payer: Self-pay | Admitting: Surgery

## 2019-12-21 NOTE — H&P (Signed)
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 male who is here for followup from above.  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  States each bag was able to last 3 to 5 days prior to requiring change.  He states he recently has started noticing an increased bulge in the area which has made his back not last as long.  He currently states that he has leakages within a day or 2 and and has been inhibiting his daily activities.  Denies any pain. .     Current Medications: has a current medication list which includes the following prescription(s): amlodipine, aspirin, clonidine hcl, labetalol, multivitamin with minerals, and neomycin.  Allergies:      Allergies  Allergen Reactions  . Codeine Vomiting  . Morphine Vomiting    ROS: General: Denies weight loss, weight gain, fatigue, fevers, chills, and night sweats. Heart: Denies chest pain, palpitations, racing heart, irregular heartbeat, leg pain or swelling, and decreased activity tolerance. Respiratory: Denies breathing difficulty, shortness of breath, wheezing, cough, and sputum. GI: Denies change in appetite, heartburn, nausea, vomiting, constipation, diarrhea, and blood in stool. GU: Denies difficulty urinating, pain with urinating, urgency, frequency, blood in urine    Objective:   BP 129/83   Pulse 65   Ht 185.4 cm (6\' 1" )   Wt 98 kg (216 lb)   BMI 28.50 kg/m   Constitutional :  alert, appears stated age, cooperative and no distress  Gastrointestinal: soft, non-tender; bowel sounds normal; no masses,  no organomegaly.  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  He does have a increasing bulge deep to this colostomy that decreases in size when he lays supine.  Extensive diastases noted during the exam as well. Non tender  Musculoskeletal: Steady gait and movement  Skin: Cool and moist,  incision clean, dry, intact.  No erythema, induration or drainage to indicate infection.    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  No evidence of acute incarceration or issues with stooling so no need for emergent reversal at this time. The risk of laparoscopic colon  surgery includes, but not limited to, recurrence, bleeding, chronic pain, post-op infxn, post-op SBO or ileus, hernias, resection of bowel, re-anastamosis, possible ostomy placement and need for re-operation to address said risks. The risks of general anesthetic, if used, includes MI, CVA, sudden death or even reaction to anesthetic medications also discussed. Alternatives include continued observation.  Benefits include possible symptom relief, preventing further decline in health and possible death.   Typical post-op recovery time of additional days in hospital for observation afterwards also discussed.     All questions addressed at this time.  Follow-up as needed.

## 2019-12-22 DIAGNOSIS — Z933 Colostomy status: Secondary | ICD-10-CM | POA: Diagnosis not present

## 2019-12-23 ENCOUNTER — Other Ambulatory Visit: Payer: Self-pay

## 2019-12-23 ENCOUNTER — Encounter
Admission: RE | Admit: 2019-12-23 | Discharge: 2019-12-23 | Disposition: A | Discharge status: Home / Self Care | Payer: Medicare Other | Source: Ambulatory Visit | Attending: Surgery | Admitting: Surgery

## 2019-12-23 DIAGNOSIS — R9431 Abnormal electrocardiogram [ECG] [EKG]: Secondary | ICD-10-CM | POA: Diagnosis not present

## 2019-12-23 DIAGNOSIS — I4430 Unspecified atrioventricular block: Secondary | ICD-10-CM | POA: Insufficient documentation

## 2019-12-23 DIAGNOSIS — Z01818 Encounter for other preprocedural examination: Secondary | ICD-10-CM | POA: Insufficient documentation

## 2019-12-23 DIAGNOSIS — I4892 Unspecified atrial flutter: Secondary | ICD-10-CM | POA: Insufficient documentation

## 2019-12-23 DIAGNOSIS — I4891 Unspecified atrial fibrillation: Secondary | ICD-10-CM | POA: Insufficient documentation

## 2019-12-23 NOTE — Patient Instructions (Addendum)
Your procedure is scheduled on: Monday 01/02/20.  Report to DAY SURGERY DEPARTMENT LOCATED ON 2ND FLOOR MEDICAL MALL ENTRANCE. To find out your arrival time please call [phone] between 1PM - 3PM on Friday 12/30/19.   Remember: Instructions that are not followed completely may result in serious medical risk, up to and including death, or upon the discretion of your surgeon and anesthesiologist your surgery may need to be rescheduled.      __X__ 1. Clear liquids all day on 01/01/20.      Follow Dr. Geoffery Lyons diet instructions.   STOP drinking clear liquids 2 hours prior to your scheduled arrival time on the morning of surgery.   __X__ 2.  On the morning of surgery brush your teeth with toothpaste and water, you may rinse your mouth with mouthwash if you wish.  Do not swallow any toothpaste or mouthwash.       _X__ 3.  No Alcohol for 24 hours before or after surgery.      _X__ 4.  Do Not Smoke or use e-cigarettes For 24 Hours Prior to Your Surgery.                 Do not use any chewable tobacco products for at least 6 hours prior to                 surgery.    __X__ 5.  Notify your doctor if there is any change in your medical condition      (cold, fever, infections).       Do not wear jewelry, make-up, hairpins, clips or nail polish. Do not wear lotions, powders, or perfumes.  Do not shave 48 hours prior to surgery. Men may shave face and neck. Do not bring valuables to the hospital.     [facility] is not responsible for any belongings or valuables.   Contacts, dentures/partials or body piercings may not be worn into surgery. Bring a case for your contacts, glasses or hearing aids, a denture cup will be supplied.    Patients discharged the day of surgery will not be allowed to drive home.    __X__ Take these medicines the morning of surgery with A SIP OF WATER:     1. amLODipine (NORVASC)   2. labetalol (NORMODYNE)     __X__ Use CHG Soap/SAGE wipes as directed      __X__ Stop Blood Thinners: Aspirin. Your last dose will be on Tuesday 12/27/19.    __X__ Stop Anti-inflammatories 7 days before surgery such as Advil, Ibuprofen, Motrin, BC or Goodies Powder, Naprosyn, Naproxen, Aleve, Aspirin, Meloxicam. May take Tylenol if needed for pain or discomfort.

## 2019-12-26 ENCOUNTER — Encounter
Admission: RE | Admit: 2019-12-26 | Discharge: 2019-12-26 | Disposition: A | Discharge status: Home / Self Care | Payer: Medicare Other | Source: Ambulatory Visit | Attending: Surgery | Admitting: Surgery

## 2019-12-26 ENCOUNTER — Other Ambulatory Visit: Payer: Self-pay

## 2019-12-26 DIAGNOSIS — I4430 Unspecified atrioventricular block: Secondary | ICD-10-CM | POA: Diagnosis not present

## 2019-12-26 DIAGNOSIS — I4891 Unspecified atrial fibrillation: Secondary | ICD-10-CM | POA: Diagnosis not present

## 2019-12-26 DIAGNOSIS — R9431 Abnormal electrocardiogram [ECG] [EKG]: Secondary | ICD-10-CM | POA: Diagnosis not present

## 2019-12-26 DIAGNOSIS — Z01810 Encounter for preprocedural cardiovascular examination: Secondary | ICD-10-CM | POA: Diagnosis not present

## 2019-12-26 DIAGNOSIS — I4892 Unspecified atrial flutter: Secondary | ICD-10-CM | POA: Diagnosis not present

## 2019-12-26 DIAGNOSIS — Z01818 Encounter for other preprocedural examination: Secondary | ICD-10-CM | POA: Diagnosis not present

## 2019-12-26 LAB — BASIC METABOLIC PANEL
Anion gap: 8 (ref 5–15)
BUN: 18 mg/dL (ref 8–23)
CO2: 26 mmol/L (ref 22–32)
Calcium: 8.7 mg/dL — ABNORMAL LOW (ref 8.9–10.3)
Chloride: 102 mmol/L (ref 98–111)
Creatinine, Ser: 1.27 mg/dL — ABNORMAL HIGH (ref 0.61–1.24)
GFR calc Af Amer: >60 mL/min (ref >60)
GFR calc non Af Amer: 53 mL/min — ABNORMAL LOW (ref >60)
Glucose, Bld: 108 mg/dL — ABNORMAL HIGH (ref 70–99)
Potassium: 4 mmol/L (ref 3.5–5.1)
Sodium: 136 mmol/L (ref 135–145)

## 2019-12-26 LAB — CBC
HCT: 37.8 % — ABNORMAL LOW (ref 39.0–52.0)
Hemoglobin: 12.7 g/dL — ABNORMAL LOW (ref 13.0–17.0)
MCH: 34.9 pg — ABNORMAL HIGH (ref 26.0–34.0)
MCHC: 33.6 g/dL (ref 30.0–36.0)
MCV: 103.8 fL — ABNORMAL HIGH (ref 80.0–100.0)
Platelets: 200 10*3/uL (ref 150–400)
RBC: 3.64 MIL/uL — ABNORMAL LOW (ref 4.22–5.81)
RDW: 13.5 % (ref 11.5–15.5)
WBC: 4.2 10*3/uL (ref 4.0–10.5)
nRBC: 0 % (ref 0.0–0.2)

## 2019-12-29 ENCOUNTER — Other Ambulatory Visit: Admission: RE | Admit: 2019-12-29 | Payer: Medicare Other | Source: Ambulatory Visit

## 2020-01-04 DIAGNOSIS — I480 Paroxysmal atrial fibrillation: Secondary | ICD-10-CM | POA: Insufficient documentation

## 2020-01-04 DIAGNOSIS — I4892 Unspecified atrial flutter: Secondary | ICD-10-CM | POA: Diagnosis not present

## 2020-01-04 DIAGNOSIS — I4819 Other persistent atrial fibrillation: Secondary | ICD-10-CM | POA: Diagnosis not present

## 2020-01-04 DIAGNOSIS — I350 Nonrheumatic aortic (valve) stenosis: Secondary | ICD-10-CM | POA: Diagnosis not present

## 2020-01-04 DIAGNOSIS — I10 Essential (primary) hypertension: Secondary | ICD-10-CM | POA: Diagnosis not present

## 2020-01-04 DIAGNOSIS — I4891 Unspecified atrial fibrillation: Secondary | ICD-10-CM | POA: Diagnosis not present

## 2020-01-04 DIAGNOSIS — I119 Hypertensive heart disease without heart failure: Secondary | ICD-10-CM | POA: Diagnosis not present

## 2020-01-05 DIAGNOSIS — I4819 Other persistent atrial fibrillation: Secondary | ICD-10-CM | POA: Diagnosis not present

## 2020-01-09 ENCOUNTER — Other Ambulatory Visit: Payer: Self-pay | Admitting: Internal Medicine

## 2020-01-09 ENCOUNTER — Ambulatory Visit
Admission: RE | Admit: 2020-01-09 | Discharge: 2020-01-09 | Disposition: A | Discharge status: Home / Self Care | Payer: Medicare Other | Attending: Internal Medicine | Admitting: Internal Medicine

## 2020-01-09 ENCOUNTER — Other Ambulatory Visit: Payer: Self-pay

## 2020-01-09 ENCOUNTER — Ambulatory Visit
Admission: RE | Admit: 2020-01-09 | Discharge: 2020-01-09 | Disposition: A | Discharge status: Home / Self Care | Payer: Medicare Other | Source: Ambulatory Visit | Attending: Internal Medicine | Admitting: Internal Medicine

## 2020-01-09 DIAGNOSIS — I517 Cardiomegaly: Secondary | ICD-10-CM

## 2020-01-09 DIAGNOSIS — R011 Cardiac murmur, unspecified: Secondary | ICD-10-CM

## 2020-01-16 ENCOUNTER — Other Ambulatory Visit: Admission: RE | Admit: 2020-01-16 | Payer: Medicare Other | Source: Ambulatory Visit

## 2020-01-17 DIAGNOSIS — I350 Nonrheumatic aortic (valve) stenosis: Secondary | ICD-10-CM | POA: Diagnosis not present

## 2020-01-17 DIAGNOSIS — I10 Essential (primary) hypertension: Secondary | ICD-10-CM | POA: Diagnosis not present

## 2020-01-17 DIAGNOSIS — N529 Male erectile dysfunction, unspecified: Secondary | ICD-10-CM | POA: Diagnosis not present

## 2020-01-17 DIAGNOSIS — Z433 Encounter for attention to colostomy: Secondary | ICD-10-CM | POA: Diagnosis not present

## 2020-01-19 DIAGNOSIS — Z933 Colostomy status: Secondary | ICD-10-CM | POA: Diagnosis not present

## 2020-01-24 DIAGNOSIS — I4819 Other persistent atrial fibrillation: Secondary | ICD-10-CM | POA: Diagnosis not present

## 2020-01-31 DIAGNOSIS — I350 Nonrheumatic aortic (valve) stenosis: Secondary | ICD-10-CM | POA: Insufficient documentation

## 2020-01-31 DIAGNOSIS — I4819 Other persistent atrial fibrillation: Secondary | ICD-10-CM | POA: Diagnosis not present

## 2020-01-31 DIAGNOSIS — I10 Essential (primary) hypertension: Secondary | ICD-10-CM | POA: Insufficient documentation

## 2020-02-01 ENCOUNTER — Other Ambulatory Visit: Payer: Self-pay

## 2020-02-01 ENCOUNTER — Other Ambulatory Visit
Admission: RE | Admit: 2020-02-01 | Discharge: 2020-02-01 | Disposition: A | Discharge status: Home / Self Care | Payer: Medicare Other | Source: Ambulatory Visit | Attending: Surgery | Admitting: Surgery

## 2020-02-01 DIAGNOSIS — K435 Parastomal hernia without obstruction or  gangrene: Secondary | ICD-10-CM | POA: Diagnosis not present

## 2020-02-01 DIAGNOSIS — Z20822 Contact with and (suspected) exposure to covid-19: Secondary | ICD-10-CM | POA: Diagnosis not present

## 2020-02-01 DIAGNOSIS — Z9049 Acquired absence of other specified parts of digestive tract: Secondary | ICD-10-CM | POA: Diagnosis not present

## 2020-02-01 DIAGNOSIS — Z885 Allergy status to narcotic agent status: Secondary | ICD-10-CM | POA: Diagnosis not present

## 2020-02-01 DIAGNOSIS — I10 Essential (primary) hypertension: Secondary | ICD-10-CM | POA: Diagnosis not present

## 2020-02-01 DIAGNOSIS — Z433 Encounter for attention to colostomy: Secondary | ICD-10-CM | POA: Diagnosis not present

## 2020-02-01 LAB — SARS CORONAVIRUS 2 (TAT 6-24 HRS): SARS Coronavirus 2: NEGATIVE

## 2020-02-01 NOTE — Pre-Procedure Instructions (Signed)
Chart review r/t previously cancelled surgery now scheduled for surgery.  Called patient to verify he still has instructions and supplies previously given from cancelled procedure. Questions encouraged and answered. Understanding verbalized and questions answered to patient's satisfaction. He still has CHG wipes and instructions.  Cardiology clearance placed on chart.

## 2020-02-03 ENCOUNTER — Inpatient Hospital Stay
Admission: RE | Admit: 2020-02-03 | Discharge: 2020-02-06 | DRG: 346 | Disposition: A | Discharge status: Home / Self Care | Payer: Medicare Other | Attending: Surgery | Admitting: Surgery

## 2020-02-03 ENCOUNTER — Encounter
Admission: RE | Disposition: A | Discharge status: Home / Self Care | Payer: Self-pay | Source: Home / Self Care | Attending: Surgery

## 2020-02-03 ENCOUNTER — Encounter: Payer: Self-pay | Admitting: Surgery

## 2020-02-03 ENCOUNTER — Other Ambulatory Visit: Payer: Self-pay

## 2020-02-03 ENCOUNTER — Inpatient Hospital Stay: Payer: Medicare Other | Admitting: Anesthesiology

## 2020-02-03 DIAGNOSIS — Z885 Allergy status to narcotic agent status: Secondary | ICD-10-CM

## 2020-02-03 DIAGNOSIS — Z9049 Acquired absence of other specified parts of digestive tract: Secondary | ICD-10-CM

## 2020-02-03 DIAGNOSIS — Z20822 Contact with and (suspected) exposure to covid-19: Secondary | ICD-10-CM | POA: Diagnosis present

## 2020-02-03 DIAGNOSIS — K435 Parastomal hernia without obstruction or  gangrene: Secondary | ICD-10-CM | POA: Diagnosis present

## 2020-02-03 DIAGNOSIS — Z933 Colostomy status: Secondary | ICD-10-CM

## 2020-02-03 DIAGNOSIS — Z433 Encounter for attention to colostomy: Principal | ICD-10-CM

## 2020-02-03 HISTORY — PX: XI ROBOTIC ASSISTED COLOSTOMY TAKEDOWN: SHX6828

## 2020-02-03 LAB — PROTIME-INR
INR: 1.2 (ref 0.8–1.2)
Prothrombin Time: 14.9 seconds (ref 11.4–15.2)

## 2020-02-03 LAB — CBC
HCT: 37.4 % — ABNORMAL LOW (ref 39.0–52.0)
Hemoglobin: 12.7 g/dL — ABNORMAL LOW (ref 13.0–17.0)
MCH: 34.4 pg — ABNORMAL HIGH (ref 26.0–34.0)
MCHC: 34 g/dL (ref 30.0–36.0)
MCV: 101.4 fL — ABNORMAL HIGH (ref 80.0–100.0)
Platelets: 180 10*3/uL (ref 150–400)
RBC: 3.69 MIL/uL — ABNORMAL LOW (ref 4.22–5.81)
RDW: 12.9 % (ref 11.5–15.5)
WBC: 4.6 10*3/uL (ref 4.0–10.5)
nRBC: 0 % (ref 0.0–0.2)

## 2020-02-03 LAB — CREATININE, SERUM
Creatinine, Ser: 1.3 mg/dL — ABNORMAL HIGH (ref 0.61–1.24)
GFR calc Af Amer: >60 mL/min (ref >60)
GFR calc non Af Amer: 52 mL/min — ABNORMAL LOW (ref >60)

## 2020-02-03 SURGERY — CLOSURE, COLOSTOMY, ROBOT-ASSISTED
Anesthesia: General | Site: Abdomen

## 2020-02-03 MED ORDER — PROPOFOL 10 MG/ML IV BOLUS
INTRAVENOUS | Status: DC | PRN
  Administered 2020-02-03: 120 mg via INTRAVENOUS

## 2020-02-03 MED ORDER — FAMOTIDINE 20 MG PO TABS
ORAL_TABLET | ORAL | Status: AC
  Administered 2020-02-03: 20 mg via ORAL
  Filled 2020-02-03: qty 1

## 2020-02-03 MED ORDER — CELECOXIB 200 MG PO CAPS
ORAL_CAPSULE | ORAL | Status: AC
  Administered 2020-02-03: 200 mg via ORAL
  Filled 2020-02-03: qty 1

## 2020-02-03 MED ORDER — CHLORHEXIDINE GLUCONATE CLOTH 2 % EX PADS: 6.0000 | MEDICATED_PAD | Freq: Once | CUTANEOUS | Status: DC

## 2020-02-03 MED ORDER — LACTATED RINGERS IV SOLN: INTRAVENOUS | Status: DC

## 2020-02-03 MED ORDER — ONDANSETRON HCL 4 MG/2ML IJ SOLN: 4.0000 mg | Freq: Once | INTRAMUSCULAR | Status: DC | PRN

## 2020-02-03 MED ORDER — BUPIVACAINE HCL (PF) 0.5 % IJ SOLN
INTRAMUSCULAR | Status: AC
  Filled 2020-02-03: qty 30

## 2020-02-03 MED ORDER — HYDROMORPHONE HCL 1 MG/ML IJ SOLN: 0.5000 mg | INTRAMUSCULAR | Status: DC | PRN

## 2020-02-03 MED ORDER — PROPOFOL 10 MG/ML IV BOLUS
INTRAVENOUS | Status: AC
  Filled 2020-02-03: qty 40

## 2020-02-03 MED ORDER — ACETAMINOPHEN 500 MG PO TABS
ORAL_TABLET | ORAL | Status: AC
  Administered 2020-02-03: 07:00:00 1000 mg via ORAL
  Filled 2020-02-03: qty 2

## 2020-02-03 MED ORDER — MIDAZOLAM HCL 2 MG/2ML IJ SOLN
INTRAMUSCULAR | Status: AC
  Filled 2020-02-03: qty 2

## 2020-02-03 MED ORDER — FENTANYL CITRATE (PF) 100 MCG/2ML IJ SOLN
25.0000 ug | INTRAMUSCULAR | Status: DC | PRN
  Administered 2020-02-03: 25 ug via INTRAVENOUS

## 2020-02-03 MED ORDER — ONDANSETRON HCL 4 MG PO TABS: 4.0000 mg | ORAL_TABLET | Freq: Four times a day (QID) | ORAL | Status: DC | PRN

## 2020-02-03 MED ORDER — ROCURONIUM BROMIDE 100 MG/10ML IV SOLN
INTRAVENOUS | Status: DC | PRN
  Administered 2020-02-03: 50 mg via INTRAVENOUS
  Administered 2020-02-03 (×2): 10 mg via INTRAVENOUS
  Administered 2020-02-03: 5 mg via INTRAVENOUS
  Administered 2020-02-03: 20 mg via INTRAVENOUS
  Administered 2020-02-03: 10 mg via INTRAVENOUS

## 2020-02-03 MED ORDER — ACETAMINOPHEN 500 MG PO TABS
1000.0000 mg | ORAL_TABLET | Freq: Four times a day (QID) | ORAL | Status: DC
  Administered 2020-02-03 – 2020-02-05 (×9): 1000 mg via ORAL
  Filled 2020-02-03 (×10): qty 2

## 2020-02-03 MED ORDER — AMLODIPINE BESYLATE 10 MG PO TABS
10.0000 mg | ORAL_TABLET | Freq: Every day | ORAL | Status: DC
  Administered 2020-02-03 – 2020-02-06 (×4): 10 mg via ORAL
  Filled 2020-02-03 (×4): qty 1

## 2020-02-03 MED ORDER — FAMOTIDINE 20 MG PO TABS: 20.0000 mg | ORAL_TABLET | Freq: Once | ORAL | Status: AC

## 2020-02-03 MED ORDER — ONDANSETRON HCL 4 MG/2ML IJ SOLN
INTRAMUSCULAR | Status: AC
  Filled 2020-02-03: qty 2

## 2020-02-03 MED ORDER — INDOCYANINE GREEN 25 MG IV SOLR
7.5000 mg | Freq: Once | INTRAVENOUS | Status: DC
  Filled 2020-02-03: qty 10

## 2020-02-03 MED ORDER — CELECOXIB 200 MG PO CAPS: 200.0000 mg | ORAL_CAPSULE | ORAL | Status: AC

## 2020-02-03 MED ORDER — CLONIDINE HCL 0.1 MG PO TABS
0.1000 mg | ORAL_TABLET | Freq: Every day | ORAL | Status: DC
  Administered 2020-02-03 – 2020-02-05 (×3): 0.1 mg via ORAL
  Filled 2020-02-03 (×3): qty 1

## 2020-02-03 MED ORDER — ONDANSETRON HCL 4 MG/2ML IJ SOLN: 4.0000 mg | Freq: Four times a day (QID) | INTRAMUSCULAR | Status: DC | PRN

## 2020-02-03 MED ORDER — LIDOCAINE HCL (CARDIAC) PF 100 MG/5ML IV SOSY
PREFILLED_SYRINGE | INTRAVENOUS | Status: DC | PRN
  Administered 2020-02-03: 100 mg via INTRAVENOUS

## 2020-02-03 MED ORDER — FENTANYL CITRATE (PF) 100 MCG/2ML IJ SOLN
INTRAMUSCULAR | Status: DC | PRN
  Administered 2020-02-03 (×2): 50 ug via INTRAVENOUS

## 2020-02-03 MED ORDER — SODIUM CHLORIDE 0.9 % IV SOLN
2.0000 g | INTRAVENOUS | Status: AC
  Administered 2020-02-03: 2 g via INTRAVENOUS
  Filled 2020-02-03: qty 2

## 2020-02-03 MED ORDER — ENOXAPARIN SODIUM 40 MG/0.4ML ~~LOC~~ SOLN
40.0000 mg | SUBCUTANEOUS | Status: DC
  Administered 2020-02-04 – 2020-02-05 (×2): 40 mg via SUBCUTANEOUS
  Filled 2020-02-03 (×2): qty 0.4

## 2020-02-03 MED ORDER — PHENYLEPHRINE HCL (PRESSORS) 10 MG/ML IV SOLN
INTRAVENOUS | Status: AC
  Filled 2020-02-03: qty 1

## 2020-02-03 MED ORDER — GABAPENTIN 300 MG PO CAPS
ORAL_CAPSULE | ORAL | Status: AC
  Administered 2020-02-03: 300 mg via ORAL
  Filled 2020-02-03: qty 1

## 2020-02-03 MED ORDER — PHENYLEPHRINE HCL (PRESSORS) 10 MG/ML IV SOLN
INTRAVENOUS | Status: DC | PRN
  Administered 2020-02-03: 200 ug via INTRAVENOUS
  Administered 2020-02-03 (×4): 100 ug via INTRAVENOUS

## 2020-02-03 MED ORDER — BUPIVACAINE HCL (PF) 0.5 % IJ SOLN
INTRAMUSCULAR | Status: DC | PRN
  Administered 2020-02-03: 11 mL
  Administered 2020-02-03: 10 mL

## 2020-02-03 MED ORDER — LABETALOL HCL 200 MG PO TABS
200.0000 mg | ORAL_TABLET | Freq: Every morning | ORAL | Status: DC
  Administered 2020-02-05: 200 mg via ORAL
  Filled 2020-02-03 (×3): qty 1

## 2020-02-03 MED ORDER — GABAPENTIN 300 MG PO CAPS: 300.0000 mg | ORAL_CAPSULE | ORAL | Status: AC

## 2020-02-03 MED ORDER — DEXAMETHASONE SODIUM PHOSPHATE 10 MG/ML IJ SOLN
INTRAMUSCULAR | Status: DC | PRN
  Administered 2020-02-03: 4 mg via INTRAVENOUS

## 2020-02-03 MED ORDER — SUGAMMADEX SODIUM 200 MG/2ML IV SOLN
INTRAVENOUS | Status: DC | PRN
  Administered 2020-02-03: 400 mg via INTRAVENOUS

## 2020-02-03 MED ORDER — CHLORHEXIDINE GLUCONATE CLOTH 2 % EX PADS
6.0000 | MEDICATED_PAD | Freq: Every day | CUTANEOUS | Status: DC
  Administered 2020-02-04: 6 via TOPICAL

## 2020-02-03 MED ORDER — SODIUM CHLORIDE 0.9 % IV SOLN: INTRAVENOUS | Status: DC | PRN

## 2020-02-03 MED ORDER — MIDAZOLAM HCL 2 MG/2ML IJ SOLN
INTRAMUSCULAR | Status: DC | PRN
  Administered 2020-02-03 (×2): .5 mg via INTRAVENOUS

## 2020-02-03 MED ORDER — BUPIVACAINE LIPOSOME 1.3 % IJ SUSP
INTRAMUSCULAR | Status: DC | PRN
  Administered 2020-02-03: 20 mL

## 2020-02-03 MED ORDER — ACETAMINOPHEN 500 MG PO TABS: 1000.0000 mg | ORAL_TABLET | ORAL | Status: AC

## 2020-02-03 MED ORDER — ROCURONIUM BROMIDE 10 MG/ML (PF) SYRINGE
PREFILLED_SYRINGE | INTRAVENOUS | Status: AC
  Filled 2020-02-03: qty 10

## 2020-02-03 MED ORDER — FENTANYL CITRATE (PF) 100 MCG/2ML IJ SOLN
INTRAMUSCULAR | Status: AC
  Administered 2020-02-03: 13:00:00 25 ug via INTRAVENOUS
  Filled 2020-02-03: qty 2

## 2020-02-03 MED ORDER — FENTANYL CITRATE (PF) 100 MCG/2ML IJ SOLN
INTRAMUSCULAR | Status: AC
  Filled 2020-02-03: qty 2

## 2020-02-03 MED ORDER — ONDANSETRON HCL 4 MG/2ML IJ SOLN
INTRAMUSCULAR | Status: DC | PRN
  Administered 2020-02-03: 4 mg via INTRAVENOUS

## 2020-02-03 MED ORDER — BUPIVACAINE LIPOSOME 1.3 % IJ SUSP
INTRAMUSCULAR | Status: AC
  Filled 2020-02-03: qty 20

## 2020-02-03 MED ORDER — SODIUM CHLORIDE 0.9 % IV SOLN
INTRAVENOUS | Status: DC | PRN
  Administered 2020-02-03: 15 ug/min via INTRAVENOUS

## 2020-02-03 MED ORDER — GABAPENTIN 300 MG PO CAPS
300.0000 mg | ORAL_CAPSULE | Freq: Two times a day (BID) | ORAL | Status: DC
  Administered 2020-02-03 – 2020-02-05 (×6): 300 mg via ORAL
  Filled 2020-02-03 (×6): qty 1

## 2020-02-03 MED ORDER — LIDOCAINE HCL (PF) 2 % IJ SOLN
INTRAMUSCULAR | Status: AC
  Filled 2020-02-03: qty 5

## 2020-02-03 MED ORDER — CELECOXIB 200 MG PO CAPS
200.0000 mg | ORAL_CAPSULE | Freq: Two times a day (BID) | ORAL | Status: DC
  Administered 2020-02-03 – 2020-02-05 (×6): 200 mg via ORAL
  Filled 2020-02-03 (×7): qty 1

## 2020-02-03 MED ORDER — ACETAMINOPHEN 325 MG PO TABS: 650.0000 mg | ORAL_TABLET | Freq: Four times a day (QID) | ORAL | Status: DC | PRN

## 2020-02-03 SURGICAL SUPPLY — 95 items
BLADE CLIPPER SURG (BLADE) ×2 IMPLANT
BLADE SURG SZ10 CARB STEEL (BLADE) ×2 IMPLANT
BLADE SURG SZ11 CARB STEEL (BLADE) ×2 IMPLANT
CANISTER SUCT 1200ML W/VALVE (MISCELLANEOUS) ×2 IMPLANT
CANNULA REDUC XI 12-8 STAPL (CANNULA) ×2
CANNULA REDUCER 12-8 DVNC XI (CANNULA) ×1 IMPLANT
CHLORAPREP W/TINT 26 (MISCELLANEOUS) ×2 IMPLANT
COVER TIP SHEARS 8 DVNC (MISCELLANEOUS) ×1 IMPLANT
COVER TIP SHEARS 8MM DA VINCI (MISCELLANEOUS) ×2
COVER WAND RF STERILE (DRAPES) IMPLANT
DEFOGGER SCOPE WARMER CLEARIFY (MISCELLANEOUS) ×2 IMPLANT
DERMABOND ADVANCED (GAUZE/BANDAGES/DRESSINGS) ×1
DERMABOND ADVANCED .7 DNX12 (GAUZE/BANDAGES/DRESSINGS) ×1 IMPLANT
DRAPE 3/4 80X56 (DRAPES) ×2 IMPLANT
DRAPE ARM DVNC X/XI (DISPOSABLE) ×4 IMPLANT
DRAPE COLUMN DVNC XI (DISPOSABLE) ×1 IMPLANT
DRAPE DA VINCI XI ARM (DISPOSABLE) ×8
DRAPE DA VINCI XI COLUMN (DISPOSABLE) ×2
DRAPE UNDER BUTTOCK W/FLU (DRAPES) ×2 IMPLANT
DRSG OPSITE POSTOP 4X10 (GAUZE/BANDAGES/DRESSINGS) IMPLANT
DRSG OPSITE POSTOP 4X8 (GAUZE/BANDAGES/DRESSINGS) IMPLANT
ELECT CAUTERY BLADE 6.4 (BLADE) IMPLANT
ELECT REM PT RETURN 9FT ADLT (ELECTROSURGICAL) ×2
ELECTRODE REM PT RTRN 9FT ADLT (ELECTROSURGICAL) ×1 IMPLANT
GAUZE PACKING IODOFORM 1/2 (PACKING) ×2 IMPLANT
GLOVE BIOGEL PI IND STRL 7.0 (GLOVE) ×3 IMPLANT
GLOVE BIOGEL PI INDICATOR 7.0 (GLOVE) ×3
GLOVE SURG SYN 6.5 ES PF (GLOVE) ×6 IMPLANT
GOWN STRL REUS W/ TWL LRG LVL3 (GOWN DISPOSABLE) ×6 IMPLANT
GOWN STRL REUS W/TWL LRG LVL3 (GOWN DISPOSABLE) ×12
GRASPER SUT TROCAR 14GX15 (MISCELLANEOUS) IMPLANT
HANDLE YANKAUER SUCT BULB TIP (MISCELLANEOUS) ×2 IMPLANT
IRRIGATION STRYKERFLOW (MISCELLANEOUS) IMPLANT
IRRIGATOR STRYKERFLOW (MISCELLANEOUS)
IRRIGATOR SUCT 8 DISP DVNC XI (IRRIGATION / IRRIGATOR) ×1 IMPLANT
IRRIGATOR SUCTION 8MM XI DISP (IRRIGATION / IRRIGATOR) ×2
IV NS 1000ML (IV SOLUTION)
IV NS 1000ML BAXH (IV SOLUTION) IMPLANT
KIT PINK PAD W/HEAD ARE REST (MISCELLANEOUS) ×2
KIT PINK PAD W/HEAD ARM REST (MISCELLANEOUS) ×1 IMPLANT
KITTNER LAPARASCOPIC 5X40 (MISCELLANEOUS) ×2 IMPLANT
LABEL OR SOLS (LABEL) IMPLANT
LEGGING LITHOTOMY PAIR STRL (DRAPES) ×2 IMPLANT
NEEDLE HYPO 22GX1.5 SAFETY (NEEDLE) ×2 IMPLANT
NEEDLE INSUFFLATION 14GA 120MM (NEEDLE) ×2 IMPLANT
OBTURATOR OPTICAL STANDARD 8MM (TROCAR) ×2
OBTURATOR OPTICAL STND 8 DVNC (TROCAR) ×1
OBTURATOR OPTICALSTD 8 DVNC (TROCAR) ×1 IMPLANT
PACK COLON CLEAN CLOSURE (MISCELLANEOUS) ×2 IMPLANT
PACK LAP CHOLECYSTECTOMY (MISCELLANEOUS) ×2 IMPLANT
PENCIL ELECTRO HAND CTR (MISCELLANEOUS) ×2 IMPLANT
RELOAD STAPLER 3.5X45 BLU DVNC (STAPLE) IMPLANT
RELOAD STAPLER 3.5X60 BLU DVNC (STAPLE) ×4 IMPLANT
SEAL CANN UNIV 5-8 DVNC XI (MISCELLANEOUS) ×3 IMPLANT
SEAL XI 5MM-8MM UNIVERSAL (MISCELLANEOUS) ×6
SEALER VESSEL DA VINCI XI (MISCELLANEOUS) ×2
SEALER VESSEL EXT DVNC XI (MISCELLANEOUS) ×1 IMPLANT
SLEEVE ENDOPATH XCEL 5M (ENDOMECHANICALS) ×2 IMPLANT
SOL PREP PVP 2OZ (MISCELLANEOUS) ×2
SOLUTION ELECTROLUBE (MISCELLANEOUS) ×2 IMPLANT
SOLUTION PREP PVP 2OZ (MISCELLANEOUS) ×1 IMPLANT
SPONGE LAP 4X18 RFD (DISPOSABLE) ×2 IMPLANT
STAPLER 45 DA VINCI SURE FORM (STAPLE)
STAPLER 45 SUREFORM DVNC (STAPLE) IMPLANT
STAPLER 60 DA VINCI SURE FORM (STAPLE) ×2
STAPLER 60 SUREFORM DVNC (STAPLE) ×1 IMPLANT
STAPLER CANNULA SEAL DVNC XI (STAPLE) ×1 IMPLANT
STAPLER CANNULA SEAL XI (STAPLE) ×2
STAPLER CIRCULAR 29MM (STAPLE) ×2 IMPLANT
STAPLER RELOAD 3.5X45 BLU DVNC (STAPLE)
STAPLER RELOAD 3.5X45 BLUE (STAPLE)
STAPLER RELOAD 3.5X60 BLU DVNC (STAPLE) ×4
STAPLER RELOAD 3.5X60 BLUE (STAPLE) ×8
STAPLER SKIN PROX 35W (STAPLE) ×2 IMPLANT
STAPLER SYS INTERNAL RELOAD SS (MISCELLANEOUS) ×2 IMPLANT
SURGILUBE 2OZ TUBE FLIPTOP (MISCELLANEOUS) ×2 IMPLANT
SUT MNCRL 4-0 (SUTURE) ×2
SUT MNCRL 4-0 27XMFL (SUTURE) ×1
SUT MNCRL AB 4-0 PS2 18 (SUTURE) ×2 IMPLANT
SUT PDS AB 1 CT1 36 (SUTURE) ×6 IMPLANT
SUT SILK 3 0 (SUTURE) ×2
SUT SILK 3-0 18XBRD TIE 12 (SUTURE) ×1 IMPLANT
SUT VIC AB 3-0 SH 27 (SUTURE) ×4
SUT VIC AB 3-0 SH 27X BRD (SUTURE) ×2 IMPLANT
SUT VICRYL 0 AB UR-6 (SUTURE) ×2 IMPLANT
SUTURE MNCRL 4-0 27XMF (SUTURE) ×1 IMPLANT
SYR 30ML LL (SYRINGE) ×4 IMPLANT
SYS LAPSCP GELPORT 120MM (MISCELLANEOUS) ×2
SYS TROCAR 1.5-3 SLV ABD GEL (ENDOMECHANICALS) ×2
SYSTEM LAPSCP GELPORT 120MM (MISCELLANEOUS) ×1 IMPLANT
SYSTEM TROCR 1.5-3 SLV ABD GEL (ENDOMECHANICALS) ×1 IMPLANT
SYSTEM WECK SHIELD CLOSURE (TROCAR) IMPLANT
TRAY FOLEY MTR SLVR 16FR STAT (SET/KITS/TRAYS/PACK) ×2 IMPLANT
TROCAR XCEL NON-BLD 5MMX100MML (ENDOMECHANICALS) ×2 IMPLANT
TUBING EVAC SMOKE HEATED PNEUM (TUBING) ×2 IMPLANT

## 2020-02-03 NOTE — Anesthesia Preprocedure Evaluation (Addendum)
Anesthesia Evaluation  Patient identified by MRN, date of birth, ID band Patient awake    Reviewed: Allergy & Precautions, NPO status , Patient's Chart, lab work & pertinent test results  History of Anesthesia Complications Negative for: history of anesthetic complications  Airway Mallampati: III  TM Distance: >3 FB Neck ROM: Full    Dental  (+) Upper Dentures, Lower Dentures   Pulmonary neg sleep apnea, neg COPD, former smoker,    breath sounds clear to auscultation- rhonchi (-) wheezing      Cardiovascular hypertension, Pt. on medications (-) CAD, (-) Past MI, (-) Cardiac Stents and (-) CABG + dysrhythmias Atrial Fibrillation  Rhythm:Regular Rate:Normal - Systolic murmurs and - Diastolic murmurs NM stress test 01/24/20: Normal treadmill EKG without evidence of ischemia but continued atrial  fibrillation with controlled ventricular rate Normal myocardial perfusion without evidence of myocardial ischemia  Echo 01/24/20: NORMAL LEFT VENTRICULAR SYSTOLIC FUNCTION NORMAL RIGHT VENTRICULAR SYSTOLIC FUNCTION MILD VALVULAR REGURGITATION  MODERATE VALVULAR STENOSIS  AVA(VTI)=.59cm^2 MILD MR, TR, PR, AR MODERATE AS EF 50%   Neuro/Psych neg Seizures negative neurological ROS  negative psych ROS   GI/Hepatic negative GI ROS, Neg liver ROS,   Endo/Other  negative endocrine ROSneg diabetes  Renal/GU negative Renal ROS     Musculoskeletal negative musculoskeletal ROS (+)   Abdominal (+) + obese,   Peds  Hematology negative hematology ROS (+)   Anesthesia Other Findings Past Medical History: No date: BPH (benign prostatic hyperplasia) No date: Hypertension No date: Venous reflux   Reproductive/Obstetrics                             Anesthesia Physical Anesthesia Plan  ASA: III  Anesthesia Plan: General   Post-op Pain Management:    Induction: Intravenous  PONV Risk Score and Plan: 1  and Ondansetron and Dexamethasone  Airway Management Planned: Oral ETT  Additional Equipment:   Intra-op Plan:   Post-operative Plan: Extubation in OR  Informed Consent: I have reviewed the patients History and Physical, chart, labs and discussed the procedure including the risks, benefits and alternatives for the proposed anesthesia with the patient or authorized representative who has indicated his/her understanding and acceptance.     Dental advisory given  Plan Discussed with: CRNA and Anesthesiologist  Anesthesia Plan Comments:         Anesthesia Quick Evaluation

## 2020-02-03 NOTE — Transfer of Care (Signed)
Immediate Anesthesia Transfer of Care Note  Patient: Mark Blevins  Procedure(s) Performed: XI ROBOTIC ASSISTED COLOSTOMY TAKEDOWN (N/A Abdomen)  Patient Location: PACU  Anesthesia Type:General  Level of Consciousness: drowsy  Airway & Oxygen Therapy: Patient Spontanous Breathing and Patient connected to face mask oxygen  Post-op Assessment: Report given to RN and Post -op Vital signs reviewed and stable  Post vital signs: Reviewed and stable  Last Vitals:  Vitals Value Taken Time  BP 104/63 02/03/20 1205  Temp 36.4 C 02/03/20 1205  Pulse 51 02/03/20 1212  Resp 13 02/03/20 1212  SpO2 100 % 02/03/20 1212  Vitals shown include unvalidated device data.  Last Pain:  Vitals:   02/03/20 0623  TempSrc: Temporal         Complications: No apparent anesthesia complications

## 2020-02-03 NOTE — Interval H&P Note (Signed)
History and Physical Interval Note:  02/03/2020 7:18 AM  Mark Blevins  has presented today for surgery, with the diagnosis of K43.5 Parastomal hernia without obstruction or gangrene.  The various methods of treatment have been discussed with the patient and family. After consideration of risks, benefits and other options for treatment, the patient has consented to  Procedure(s): XI ROBOTIC ASSISTED COLOSTOMY TAKEDOWN (N/A) as a surgical intervention.  The patient's history has been reviewed, patient examined, no change in status, stable for surgery.  I have reviewed the patient's chart and labs.  Questions were answered to the patient's satisfaction.     Dorcus Riga Tonna Boehringer

## 2020-02-03 NOTE — Anesthesia Procedure Notes (Signed)
Procedure Name: Intubation Date/Time: 02/03/2020 7:46 AM Performed by: Babs Sciara, CRNA Pre-anesthesia Checklist: Patient identified, Emergency Drugs available, Suction available, Patient being monitored and Timeout performed Patient Re-evaluated:Patient Re-evaluated prior to induction Oxygen Delivery Method: Circle system utilized Preoxygenation: Pre-oxygenation with 100% oxygen Induction Type: IV induction Laryngoscope Size: Mac and 4 Grade View: Grade I Tube type: Oral Tube size: 7.5 mm Number of attempts: 1 Airway Equipment and Method: Stylet Placement Confirmation: ETT inserted through vocal cords under direct vision,  positive ETCO2 and breath sounds checked- equal and bilateral Secured at: 21 (lip) cm Tube secured with: Tape Dental Injury: Teeth and Oropharynx as per pre-operative assessment

## 2020-02-03 NOTE — OR Nursing (Signed)
Called lab at this time for update on PT, INR result. Lab states they are currently working on order being active and will run asap. Will continue to monitor for results prior to pt having procedure

## 2020-02-03 NOTE — Progress Notes (Signed)
IS left with RN for education

## 2020-02-03 NOTE — Op Note (Signed)
Preoperative diagnosis: End colostomy status.  Postoperative diagnosis: End Colostomy Status.   Procedure: Robotic assisted Laparoscopic colostomy reversal  Anesthesia: GETA  Surgeon: Lysle Pearl Assistant: Windell Moment for better exposure and anastomosis creation  Specimen: Anastomosis rings  Complications: None  EBL: 20mL  Wound Classification: Clean Contaminated  Indications:  Patient is a 79 y.o. 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 Patient presents today for take down of his Hartmann's pouch.   Description of procedure: The patient was taken to the operating room and placed in the supine position. General endotracheal anesthesia was induced without any difficulty. A time-out was completed verifying correct patient, procedure, site, positioning, and implant(s) and/or special equipment prior to beginning this procedure. Both upper extremities were tucked.  A Foley catheter and an orogastric tube were placed. Preoperative antibiotics were given. The patient was re-positioned in the dorsal lithotomy position with stirrups and care was taken to pad all pressure points. The colostomy was covered with 4 x 4 gauze and  Tegaderm to prevent fecal contamination of the surgical field. The patient's abdomen and perineum were then prepped and draped in the usual sterile fashion, with ioband over field.   Veress needle was placed through Palmer's point and insufflation initiated after 2 clicks heard and palpated, positive saline drop test.  Abdomen was insufflated 15 mmHg and the patient tolerated well.  Veress needle removed a 8 mm robotic port was placed via the Optiview technique in the right upper quadrant.  Visualization of the abdominal cavity noted no injuries during trocar placement was Veress needle was patient.  3 additional 8 mm ports were placed 8 cm apart extending towards the right lower quadrant under direct visualization.  All port sites were  infused with local prior to trocar placement.  Camera was placed in the right lower quadrant port, and the excised system was docked.  Vessel sealer was then placed in the lowest right lower quadrant port and the falciform ligament was taken down prior to inserting graspers through the remaining ports.  Attention was then directed towards the pelvic region, where there were some dense peritoneal attachments noted around a visible Prolene suture.  These attachments were carefully taken down after identification of both the right and left ureter in order to prevent injury.  After the majority the peritoneal attachments to the rectal stump was removed, attention was turned towards the proximal colon, or additional dissection was carried out along the white line of Toldt and additional scar tissue to free up the colostomy as much as possible.  The majority of adhesions were taken down, the robot was undocked.  Attention was turned toward taking down the colostomy.  Abdomen desufflated and an elliptical incision encompassing the colostomy was made using the electrocautery and deepened through the subcutaneous and surrounding fascia. Once the proximal colon was freed from the fascia and reducible into the abdomen, a pursestring device was used to transect the end, and the anvil portion of a 29 mm EEA stapler was secured in place.  The anastomotic edges of the anvil was then cleared prior to reducing the colon back into the abdominal cavity.  GelPort was placed through the ostomy site in order to maintain pneumoperitoneum, insufflation was then resumed, and the robot was redocked in the initial position.  Under laparoscopic view, additional dissection was carried out along the omentum and transverse mesocolon to achieve additional slack for the proximal colon to easily reach down adjacent to the rectal stump.  The EEA  stapler was advanced carefully by an assistant from below.  The antimesenteric boarders were aligned  and proximal colon confirmed to not be twisted.  The EEA stapler was attached to the anvil and fired. The stapler was removed and the rings of tissue retained in the stapler were inspected and found to be complete with intact rings. The anastomosis was tested for patency and integrity by filling the abdomen with saline and insufflating the rectum with air while occluding the colon proximal to the anastomosis. No bubbles were noted and air was seen to pass across the anastomosis proximally. The abdomen was copiously irrigated with normal saline.  Hemostasis was checked.  Robot was undocked and GelPort removed from the former ostomy site..    The former ostomy site and port sites were infused with Exparel and was closed with 0-PDS x2. The overlying skin was approximated with 3-0 Vicryl and then loosely approximated with staples.  Iodoform packing strips were then placed in between the staples.  All robotic port sites were closed with 4-0 Monocryl in a running subcuticular fashion.  The patient tolerated the procedure well and was extubated and taken to the postoperative care unit in stable condition accom- panied by the surgical and anesthesia teams.  Foley catheter was continued.

## 2020-02-03 NOTE — OR Nursing (Signed)
Pt has been off elequis since 01/29/20, per Dr. Tonna Boehringer pt does not need PT/ INR piror to surgery.

## 2020-02-03 NOTE — H&P (Signed)
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 male who is here for followup from above. 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 States each bag was able to last 3 to 5 days prior to requiring change. He states he recently has started noticing an increased bulge in the area which has made his back not last as long.  He currently states that he has leakages within a day or 2 and and has been inhibiting his daily activities. Denies any pain. .   Current Medications: has a current medication list which includes the following prescription(s): amlodipine, aspirin, clonidine hcl, labetalol, multivitamin with minerals, and neomycin.  Allergies:  Allergies  Allergen Reactions  . Codeine Vomiting  . Morphine Vomiting   ROS: General: Denies weight loss, weight gain, fatigue, fevers, chills, and night sweats. Heart: Denies chest pain, palpitations, racing heart, irregular heartbeat, leg pain or swelling, and decreased activity tolerance. Respiratory: Denies breathing difficulty, shortness of breath, wheezing, cough, and sputum. GI: Denies change in appetite, heartburn, nausea, vomiting, constipation, diarrhea, and blood in stool. GU: Denies difficulty urinating, pain with urinating, urgency, frequency, blood in urine   Objective:    BP 129/83  Pulse 65  Ht 185.4 cm (6\' 1" )  Wt 98 kg (216 lb)  BMI 28.50 kg/m   Constitutional : alert, appears stated age, cooperative and no distress  Gastrointestinal: soft, non-tender; bowel sounds normal; no masses, no organomegaly. 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 He does have a increasing bulge deep to this colostomy that decreases in size when he lays supine. Extensive diastases noted during the exam as well. Non tender  Musculoskeletal: Steady gait and movement  Skin: Cool and moist, incision clean, dry, intact. No  erythema, induration or drainage to indicate infection.  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 Discussed r/b/a. the risk of surgery include, but not limited to, recurrence, bleeding, chronic pain, post-op infxn, post-op SBO or ileus, hernias, resection of bowel, re-anastamosis, possible ostomy placement and need for re-operation to address said risks. The risks of general anesthetic, if used, includes MI, CVA, sudden death or even reaction to anesthetic medications also discussed. Alternatives include continued use of ostomy  Benefits include reversal.  He verbalized understanding

## 2020-02-03 NOTE — Anesthesia Postprocedure Evaluation (Signed)
Anesthesia Post Note  Patient: Mark Blevins  Procedure(s) Performed: XI ROBOTIC ASSISTED COLOSTOMY TAKEDOWN (N/A Abdomen)  Patient location during evaluation: PACU Anesthesia Type: General and Combined Spinal/Epidural Level of consciousness: awake and alert and oriented Pain management: pain level controlled Vital Signs Assessment: post-procedure vital signs reviewed and stable Respiratory status: spontaneous breathing, nonlabored ventilation and respiratory function stable Cardiovascular status: blood pressure returned to baseline and stable Postop Assessment: no signs of nausea or vomiting Anesthetic complications: no     Last Vitals:  Vitals:   02/03/20 1305 02/03/20 1330  BP: (!) 102/58 113/62  Pulse: 60 60  Resp: 16 16  Temp:  36.5 C  SpO2: 100% 93%    Last Pain:  Vitals:   02/03/20 1330  TempSrc: Oral  PainSc:                  Trey Gulbranson

## 2020-02-04 LAB — MAGNESIUM: Magnesium: 2.2 mg/dL (ref 1.7–2.4)

## 2020-02-04 LAB — PHOSPHORUS: Phosphorus: 4.4 mg/dL (ref 2.5–4.6)

## 2020-02-04 LAB — BASIC METABOLIC PANEL
Anion gap: 9 (ref 5–15)
BUN: 22 mg/dL (ref 8–23)
CO2: 22 mmol/L (ref 22–32)
Calcium: 8.3 mg/dL — ABNORMAL LOW (ref 8.9–10.3)
Chloride: 104 mmol/L (ref 98–111)
Creatinine, Ser: 1.37 mg/dL — ABNORMAL HIGH (ref 0.61–1.24)
GFR calc Af Amer: 56 mL/min — ABNORMAL LOW (ref >60)
GFR calc non Af Amer: 49 mL/min — ABNORMAL LOW (ref >60)
Glucose, Bld: 106 mg/dL — ABNORMAL HIGH (ref 70–99)
Potassium: 3.8 mmol/L (ref 3.5–5.1)
Sodium: 135 mmol/L (ref 135–145)

## 2020-02-04 LAB — CBC
HCT: 34.6 % — ABNORMAL LOW (ref 39.0–52.0)
Hemoglobin: 11.7 g/dL — ABNORMAL LOW (ref 13.0–17.0)
MCH: 33.9 pg (ref 26.0–34.0)
MCHC: 33.8 g/dL (ref 30.0–36.0)
MCV: 100.3 fL — ABNORMAL HIGH (ref 80.0–100.0)
Platelets: 158 10*3/uL (ref 150–400)
RBC: 3.45 MIL/uL — ABNORMAL LOW (ref 4.22–5.81)
RDW: 12.9 % (ref 11.5–15.5)
WBC: 6.7 10*3/uL (ref 4.0–10.5)
nRBC: 0 % (ref 0.0–0.2)

## 2020-02-04 NOTE — Progress Notes (Signed)
[facility] SURGICAL PROGRESS NOTE  Hospital Day(s): 1.   Post op day(s): 1 Day Post-Op.   Interval History: Patient seen and examined, no acute events or new complaints overnight. Patient reports flatus, rumbling and readiness to begin liquids.  Also ready to have Foley out.    Vital signs in last 24 hours: [min-max] current  Temp:  [97.9 F (36.6 C)-99.1 F (37.3 C)] 98.5 F (36.9 C) (03/27 1136) Pulse Rate:  [58-75] 75 (03/27 1136) Resp:  [16-18] 18 (03/27 0818) BP: (93-137)/(54-71) 97/58 (03/27 1136) SpO2:  [94 %-98 %] 96 % (03/27 1136) Weight:  [108.2 kg] 108.2 kg (03/27 0511)     Height: 6' (182.9 cm) Weight: 108.2 kg BMI (Calculated): 32.34   Intake/Output last 2 shifts:  03/26 0701 - 03/27 0700 In: 1846.6 [I.V.:1746.6; IV Piggyback:100] Out: 1040 [Urine:1025; Blood:15]   Physical Exam:  Constitutional: alert, cooperative and no distress  Respiratory: breathing non-labored at rest  Cardiovascular: regular rate and sinus rhythm  Gastrointestinal: soft, non-tender, and non-distended,  Integumentary: Port sites sealed, clean dry and intact.  Tegaderm with moistened gauze present underlying it.  Labs:  CBC Latest Ref Rng & Units 02/04/2020 02/03/2020 12/26/2019  WBC 4.0 - 10.5 K/uL 6.7 4.6 4.2  Hemoglobin 13.0 - 17.0 g/dL 11.7(L) 12.7(L) 12.7(L)  Hematocrit 39.0 - 52.0 % 34.6(L) 37.4(L) 37.8(L)  Platelets 150 - 400 K/uL 158 180 200   CMP Latest Ref Rng & Units 02/04/2020 02/03/2020 12/26/2019  Glucose 70 - 99 mg/dL 128(N) - 867(E)  BUN 8 - 23 mg/dL 22 - 18  Creatinine 7.20 - 1.24 mg/dL 9.47(S) 9.62(E) 3.66(Q)  Sodium 135 - 145 mmol/L 135 - 136  Potassium 3.5 - 5.1 mmol/L 3.8 - 4.0  Chloride 98 - 111 mmol/L 104 - 102  CO2 22 - 32 mmol/L 22 - 26  Calcium 8.9 - 10.3 mg/dL 8.3(L) - 8.7(L)  Total Protein 6.5 - 8.1 g/dL - - -  Total Bilirubin 0.3 - 1.2 mg/dL - - -  Alkaline Phos 38 - 126 U/L - - -  AST 15 - 41 U/L - - -  ALT 0 - 44 U/L - - -      Imaging studies: No new pertinent imaging studies   Assessment/Plan:  79 y.o. male with 1 Day Post-Op s/p robotic colostomy takedown.  Patient Active Problem List   Diagnosis Date Noted  . Colostomy in place [facility]) 02/03/2020  . 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 Hypertension 12/29/2017  . Swelling of limb 12/29/2017  . 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  -Continue to ambulate, anticipating advancement of diet as tolerated.    -- Campbell Lerner, M.D., [facility] 02/04/2020

## 2020-02-04 NOTE — Progress Notes (Signed)
Patient doing well today.  Pain well controlled with Tylenol.  Dressing had some old drainage on it which MD said to change the top gauze, which was done.  Patient ambulated multiple laps around the nurses station today.  Starting on clear liquids this evening.  No significant changes.

## 2020-02-05 NOTE — Progress Notes (Signed)
   Subjective: Patient is 2 Days Post-Op.  Status post robotic colostomy takedown.  Tolerating clear liquids well.  No complaints, pleasant and ambulating.  Reports flatus, questions traces of blood in small liquid movement.  Vital signs: Temp:  [97.9 F (36.6 C)-98.5 F (36.9 C)] 98.2 F (36.8 C) (03/28 0438) Pulse Rate:  [64-75] 66 (03/28 0828) Resp:  [20] 20 (03/28 0438) BP: (97-131)/(58-73) 131/73 (03/28 0828) SpO2:  [96 %-97 %] 97 % (03/28 0438)   Intake/Output: 03/27 0701 - 03/28 0700 In: 553.3 [I.V.:553.3] Out: 750 [Urine:750] Last BM Date: 02/05/20  Physical Exam: Constitutional: Appears well Cardiac: Regular rate and rhythm Pulm: CTA Abdomen: Nondistended, new dressing left lower quadrant with minimal clear drainage on dressing.  Incisions clean and intact.  Abdomen nontender and soft.  Labs:  Recent Labs    02/03/20 0633 02/04/20 0556  WBC 4.6 6.7  HGB 12.7* 11.7*  HCT 37.4* 34.6*  PLT 180 158   Recent Labs    02/03/20 0633 02/04/20 0556  NA  --  135  K  --  3.8  CL  --  104  CO2  --  22  GLUCOSE  --  106*  BUN  --  22  CREATININE 1.30* 1.37*  CALCIUM  --  8.3*   Recent Labs    02/03/20 0633  LABPROT 14.9  INR 1.2    Imaging: No results found.  Assessment/Plan: This is a 79 y.o. male s/p robotic colostomy takedown, currently tolerating clear liquid diet.  Will advance to full liquid diet and progress slowly.  Anticipate removal of wicking in a.m.     Campbell Lerner, M.D., [facility] 02/05/2020 9:37 AM

## 2020-02-06 LAB — SURGICAL PATHOLOGY

## 2020-02-06 MED ORDER — LOPERAMIDE HCL 2 MG PO TABS: 2.0000 mg | ORAL_TABLET | Freq: Three times a day (TID) | ORAL | 0 refills | Status: DC | PRN

## 2020-02-06 MED ORDER — TRAMADOL HCL 50 MG PO TABS: 50.0000 mg | ORAL_TABLET | Freq: Four times a day (QID) | ORAL | Status: DC | PRN

## 2020-02-06 MED ORDER — ACETAMINOPHEN 325 MG PO TABS: 650.0000 mg | ORAL_TABLET | Freq: Three times a day (TID) | ORAL | 0 refills | Status: AC | PRN

## 2020-02-06 MED ORDER — IBUPROFEN 400 MG PO TABS: 400.0000 mg | ORAL_TABLET | Freq: Three times a day (TID) | ORAL | 0 refills | Status: DC | PRN

## 2020-02-06 NOTE — Progress Notes (Signed)
Mark Blevins  A and O x 4. VSS. Pt tolerating diet well. No complaints of pain or nausea. IV removed intact, prescriptions given. RN provided education to pt and his wife in how to do dressing changes, they voiced understanding. Pt voiced understanding of discharge instructions with no further questions. Pt discharged home.    Allergies as of 02/06/2020      Reactions   Codeine Nausea And Vomiting   Morphine And Related Nausea And Vomiting      Medication List    TAKE these medications   acetaminophen 325 MG tablet Commonly known as: Tylenol Take 2 tablets (650 mg total) by mouth every 8 (eight) hours as needed for mild pain.   amLODipine 10 MG tablet Commonly known as: NORVASC Take 10 mg by mouth daily.   aspirin 81 MG chewable tablet Chew 81 mg by mouth daily.   cloNIDine 0.1 MG tablet Commonly known as: CATAPRES Take 0.1 mg by mouth at bedtime.   ibuprofen 400 MG tablet Commonly known as: ADVIL Take 1 tablet (400 mg total) by mouth every 8 (eight) hours as needed for mild pain or moderate pain (for pain not relieved by tylenol). What changed:   medication strength  how much to take  reasons to take this   labetalol 200 MG tablet Commonly known as: NORMODYNE Take 200 mg by mouth every morning.   loperamide 2 MG tablet Commonly known as: Imodium A-D Take 1 tablet (2 mg total) by mouth 3 (three) times daily as needed (ONLY TAKE IF MORE THAN 4 LOOSE STOOLS A DAY).   multivitamin with minerals Tabs tablet Take 1 tablet by mouth daily.       Vitals:   02/06/20 0803 02/06/20 1032  BP: 107/69 103/66  Pulse: 76 72  Resp: 16   Temp: 98.3 F (36.8 C)   SpO2: 99%     Suzzanne Cloud

## 2020-02-06 NOTE — Discharge Instructions (Signed)
Laparoscopic Colectomy, Care After This sheet gives you information about how to care for yourself after your procedure. Your health care provider may also give you more specific instructions. If you have problems or questions, contact your health care provider. What can I expect after the procedure? After your procedure, it is common to have the following:  Pain in your abdomen, especially in the incision areas. You will be given medicine to control the pain.  Tiredness. This is a normal part of the recovery process. Your energy level will return to normal over the next several weeks.  Changes in your bowel movements, such as constipation or needing to go more often. Talk with your health care provider about how to manage this. Follow these instructions at home: Medicines   tylenol and advil as needed for discomfort.     325-650mg  every 8hrs to max of 4000mg /24hrs (including the 325mg  in every norco dose) for the tylenol.     Advil up to 800mg  per dose every 8hrs as needed for pain.    Do not drive or use heavy machinery while taking prescription pain medicine.  Do not drink alcohol while taking prescription pain medicine.  If you were prescribed an antibiotic medicine, use it as told by your health care provider. Do not stop using the antibiotic even if you start to feel better. Incision care     Follow instructions from your health care provider about how to take care of your incision areas. Make sure you: Continue daily dressing changes as discussed in clinic.  Take tip of 4x4 and use qtip to gently pack the wound as tolerated.  Remove before showering and allow soap and water to run over it.  Pat dry and then repack.  OK TO SHOWER.  No BATHs ?  ? Keep your incisions clean and dry. ? Wash your hands with soap and water before and after applying medicine to the areas, and before and after changing your bandage (dressing). If soap and water are not available, use hand  sanitizer. ? Change your dressing as told by your health care provider. ? Leave stitches (sutures), skin glue, or adhesive strips in place. These skin closures may need to stay in place for 2 weeks or longer. If adhesive strip edges start to loosen and curl up, you may trim the loose edges. Do not remove adhesive strips completely unless your health care provider tells you to do that.  Do not wear tight clothing over the incisions. Tight clothing may rub and irritate the incision areas, which may cause the incisions to open.  Do not take baths, swim, or use a hot tub until your health care provider approves. OK TO SHOWER.    Check your incision area every day for signs of infection. Check for: ? More redness, swelling, or pain. ? More fluid or blood. ? Warmth. ? Pus or a bad smell. Activity  Avoid lifting anything that is heavier than 10 lb (4.5 kg) for 2 weeks or until your health care provider says it is okay.  You may resume normal activities as told by your health care provider. Ask your health care provider what activities are safe for you.  Take rest breaks during the day as needed. Eating and drinking  Follow instructions from your health care provider about what you can eat after surgery.  To prevent or treat constipation while you are taking prescription pain medicine, your health care provider may recommend that you: ? Drink enough fluid to  keep your urine clear or pale yellow. ? Take over-the-counter or prescription medicines. ? Eat foods that are high in fiber, such as fresh fruits and vegetables, whole grains, and beans. ? Limit foods that are high in fat and processed sugars, such as fried and sweet foods. General instructions  Ask your health care provider when you will need an appointment to get your sutures or staples removed.  Keep all follow-up visits as told by your health care provider. This is important. Contact a health care provider if:  You have more  redness, swelling, or pain around your incisions.  You have more fluid or blood coming from the incisions.  Your incisions feel warm to the touch.  You have pus or a bad smell coming from your incisions or your dressing.  You have a fever.  You have an incision that breaks open (edges not staying together) after sutures or staples have been removed. Get help right away if:  You develop a rash.  You have chest pain or difficulty breathing.  You have pain or swelling in your legs.  You feel light-headed or you faint.  Your abdomen swells (becomes distended).  You have nausea or vomiting.  You have blood in your stool (feces). This information is not intended to replace advice given to you by your health care provider. Make sure you discuss any questions you have with your health care provider. Document Released: 05/16/2005 Document Revised: 07/16/2018 Document Reviewed: 07/28/2016 Elsevier Interactive Patient Education  2019 [employer].

## 2020-02-06 NOTE — Care Management Important Message (Signed)
Important Message  Patient Details  Name: Mark Blevins MRN: [id number] Date of Birth: 04/09/1941   Medicare Important Message Given:  Yes     Johnell Comings 02/06/2020, 1:26 PM

## 2020-02-07 NOTE — Discharge Summary (Signed)
Physician Discharge Summary  Patient ID: Mark Blevins MRN: [id number] DOB/AGE: 1941/10/02 79 y.o.  Admit date: 02/03/2020 Discharge date: 02/07/2020  Admission Diagnoses: Colostomy in place  Discharge Diagnoses:  Same as above  Discharged Condition: good  Hospital Course: Underwent elective colostomy reversal robotic assisted.  Postop recovered as expected, with Foley out postop day 1 started on clears and advance as tolerated.  By the time of discharge pain was controlled tolerating a regular diet with multiple bowel movements.  Wound packings were removed and patient was instructed to continue wet-to-dry dressing changes using 4 x 4 gauze until follow-up next week in the office for wound check and possible staple removal.  Consults: None  Discharge Exam: Blood pressure 103/66, pulse 72, temperature 98.3 F (36.8 C), resp. rate 16, height 6' (1.829 m), weight 104.3 kg, SpO2 99 %. General appearance: alert, cooperative and no distress GI: Soft, nontender, port site incisions clean dry and intact.  Staple at former ostomy site intact with some minimal serosanguineous drainage in between  Disposition:  Discharge disposition: 01-Home or Self Care       Discharge Instructions    Discharge patient   Complete by: As directed    Discharge disposition: 01-Home or Self Care   Discharge patient date: 02/06/2020     Allergies as of 02/06/2020      Reactions   Codeine Nausea And Vomiting   Morphine And Related Nausea And Vomiting      Medication List    TAKE these medications   acetaminophen 325 MG tablet Commonly known as: Tylenol Take 2 tablets (650 mg total) by mouth every 8 (eight) hours as needed for mild pain.   amLODipine 10 MG tablet Commonly known as: NORVASC Take 10 mg by mouth daily.   aspirin 81 MG chewable tablet Chew 81 mg by mouth daily.   cloNIDine 0.1 MG tablet Commonly known as: CATAPRES Take 0.1 mg by mouth at bedtime.   ibuprofen 400 MG  tablet Commonly known as: ADVIL Take 1 tablet (400 mg total) by mouth every 8 (eight) hours as needed for mild pain or moderate pain (for pain not relieved by tylenol). What changed:   medication strength  how much to take  reasons to take this   labetalol 200 MG tablet Commonly known as: NORMODYNE Take 200 mg by mouth every morning.   loperamide 2 MG tablet Commonly known as: Imodium A-D Take 1 tablet (2 mg total) by mouth 3 (three) times daily as needed (ONLY TAKE IF MORE THAN 4 LOOSE STOOLS A DAY).   multivitamin with minerals Tabs tablet Take 1 tablet by mouth daily.      Follow-up Information    Gaffney, Raynor Calcaterra, DO. Go on 02/14/2020.   Specialty: Surgery Why: 10:15am Contact information: [address] [place] [address] [phone]            Total time spent arranging discharge was >75min. Signed: Sung Amabile 02/07/2020, 4:15 PM

## 2020-03-19 ENCOUNTER — Other Ambulatory Visit: Payer: Self-pay | Admitting: Internal Medicine

## 2020-03-26 ENCOUNTER — Other Ambulatory Visit: Payer: Self-pay | Admitting: Internal Medicine

## 2020-04-17 ENCOUNTER — Encounter: Payer: Self-pay | Admitting: Internal Medicine

## 2020-04-17 ENCOUNTER — Other Ambulatory Visit: Payer: Self-pay

## 2020-04-17 ENCOUNTER — Ambulatory Visit (INDEPENDENT_AMBULATORY_CARE_PROVIDER_SITE_OTHER): Payer: Medicare Other | Admitting: Internal Medicine

## 2020-04-17 VITALS — BP 130/72 | HR 65 | Wt 236.6 lb

## 2020-04-17 DIAGNOSIS — M7989 Other specified soft tissue disorders: Secondary | ICD-10-CM | POA: Diagnosis not present

## 2020-04-17 DIAGNOSIS — I10 Essential (primary) hypertension: Secondary | ICD-10-CM

## 2020-04-17 DIAGNOSIS — K9409 Other complications of colostomy: Secondary | ICD-10-CM

## 2020-04-17 DIAGNOSIS — I83892 Varicose veins of left lower extremities with other complications: Secondary | ICD-10-CM | POA: Diagnosis not present

## 2020-04-17 NOTE — Assessment & Plan Note (Signed)
Blood pressure is under control on present medication 

## 2020-04-17 NOTE — Progress Notes (Signed)
Patient ID: Mark Blevins, male   DOB: [birth year], 79 y.o.   MRN: [id number]    Established Patient Office Visit  Subjective:  Patient ID: Mark Blevins, male    DOB: 22-Jul-1941  Age: 79 y.o. MRN: [id number]  CC:  Chief Complaint  Patient presents with  . Hypertension    2 month follow up on blood pressure    HPI  Mark Blevins presents for a 2 month follow up regarding hypertension. He had a colectomy in 04/2019 last year and had his colostomy bag removed on 02/03/2020. He has been having regular bowel movements and denies diarrhea. Denies abdominal pain, chest pain, leg pain, SOB, and eye problems. He does not smoke and his blood pressure is "the best it's been in years." He reports leg swelling. He walks about a mile daily. The patient is nervous about getting his COVID-19 vaccine because his wife had a bad reaction.  Past Medical History:  Diagnosis Date  . BPH (benign prostatic hyperplasia)   . Hypertension   . Venous reflux     Past Surgical History:  Procedure Laterality Date  . APPENDECTOMY    . BACK SURGERY    . COLECTOMY N/A 04/27/2019   Procedure: COLECTOMY WITH COLOSTOMY;  Surgeon: Sung Amabile, DO;  Location: [facility];  Service: General;  Laterality: N/A;  . COLONOSCOPY WITH PROPOFOL N/A 04/24/2019   Procedure: COLONOSCOPY WITH PROPOFOL;  Surgeon: Sung Amabile, DO;  Location: [facility];  Service: General;  Laterality: N/A;  . LAPAROSCOPIC LYSIS OF ADHESIONS  05/06/2019   Procedure: LAPAROSCOPIC LYSIS OF ADHESIONS;  Surgeon: Sung Amabile, DO;  Location: [facility];  Service: General;;  . LAPAROSCOPY N/A 05/06/2019   Procedure: LAPAROSCOPY DIAGNOSTIC;  Surgeon: Sung Amabile, DO;  Location: [facility];  Service: General;  Laterality: N/A;  . REPLACEMENT TOTAL KNEE BILATERAL    . XI ROBOTIC ASSISTED COLOSTOMY TAKEDOWN N/A 02/03/2020   Procedure: XI ROBOTIC ASSISTED COLOSTOMY TAKEDOWN;  Surgeon: Sung Amabile, DO;  Location: [facility];  Service: General;  Laterality: N/A;     History reviewed. No pertinent family history.  Social History   Socioeconomic History  . Marital status: Married    Spouse name: Not on file  . Number of children: Not on file  . Years of education: Not on file  . Highest education level: Not on file  Occupational History  . Not on file  Tobacco Use  . Smoking status: Former Games developer  . Smokeless tobacco: Current User    Types: Chew  . Tobacco comment: quit smoking 1969 or 1970  Substance and Sexual Activity  . Alcohol use: Yes    Comment: socially. 1beer yesterday  . Drug use: Never  . Sexual activity: Not on file  Other Topics Concern  . Not on file  Social History Narrative  . Not on file   Social Determinants of Health   Financial Resource Strain:   . Difficulty of Paying Living Expenses:   Food Insecurity:   . Worried About Programme researcher, broadcasting/film/video in the Last Year:   . Barista in the Last Year:   Transportation Needs:   . Freight forwarder (Medical):   Marland Kitchen Lack of Transportation (Non-Medical):   Physical Activity:   . Days of Exercise per Week:   . Minutes of Exercise per Session:   Stress:   . Feeling of Stress :   Social Connections:   . Frequency of Communication with Friends and Family:   .  Frequency of Social Gatherings with Friends and Family:   . Attends Religious Services:   . Active Member of Clubs or Organizations:   . Attends Archivist Meetings:   Marland Kitchen Marital Status:   Intimate Partner Violence:   . Fear of Current or Ex-Partner:   . Emotionally Abused:   Marland Kitchen Physically Abused:   . Sexually Abused:      Current Outpatient Medications:  .  amLODipine (NORVASC) 10 MG tablet, TAKE 1 TABLET BY MOUTH DAILY, Disp: 90 tablet, Rfl: 3 .  aspirin 81 MG chewable tablet, Chew 81 mg by mouth daily., Disp: , Rfl:  .  cloNIDine (CATAPRES) 0.1 MG tablet, Take 0.1 mg by mouth at bedtime. , Disp: , Rfl: 3 .  ELIQUIS 5 MG TABS tablet, TAKE 1 TABLET BY MOUTH TWICE DAILY, Disp: 60 tablet,  Rfl: 6 .  labetalol (NORMODYNE) 200 MG tablet, Take 200 mg by mouth every morning. , Disp: , Rfl: 3 .  Multiple Vitamin (MULTIVITAMIN WITH MINERALS) TABS tablet, Take 1 tablet by mouth daily., Disp: , Rfl:  .  ibuprofen (ADVIL) 400 MG tablet, Take 1 tablet (400 mg total) by mouth every 8 (eight) hours as needed for mild pain or moderate pain (for pain not relieved by tylenol). (Patient not taking: Reported on 04/17/2020), Disp: 30 tablet, Rfl: 0 .  loperamide (IMODIUM A-D) 2 MG tablet, Take 1 tablet (2 mg total) by mouth 3 (three) times daily as needed (ONLY TAKE IF MORE THAN 4 LOOSE STOOLS A DAY). (Patient not taking: Reported on 04/17/2020), Disp: 30 tablet, Rfl: 0   Allergies  Allergen Reactions  . Codeine Nausea And Vomiting  . Morphine And Related Nausea And Vomiting    ROS Review of Systems  Constitutional: Negative.   HENT: Negative.   Eyes: Negative.   Respiratory: Negative.  Negative for shortness of breath.   Cardiovascular: Positive for leg swelling. Negative for chest pain.  Gastrointestinal: Negative.  Negative for abdominal pain and diarrhea.  Endocrine: Negative.   Genitourinary: Negative.   Musculoskeletal: Negative.   Skin: Negative.   Allergic/Immunologic: Negative.   Neurological: Negative.   Hematological: Negative.   Psychiatric/Behavioral: Negative.       Objective:    Physical Exam  Constitutional: The patient is oriented to person, place, and time. Pt appears well-developed and well-nourished.  Head: Normocephalic and atraumatic.  Eyes: Pupils are equal, round, and reactive to light.  Neck: No JVD present. No tracheal deviation present. No thyromegaly present.  Cardiovascular: Regular rate and rhythm. No gallop. 2+ edema in left leg, 1+ edema in right leg. Pulmonary/Chest: Normal breath sounds. Lungs clear to auscultation. Abdominal: No abdominal tenderness. No guarding or rebound tenderness. Pt has a hernia in the center of his abdomen. No  hepatosplenomegaly. Scar from colostomy in left umbilical region. Bowel sounds are audible. Musculoskeletal: Normal range of motion.  Lymphatic: No cervical adenopathy.  Neurological: No cranial nerve deficit.  Skin: Skin is warm and hydrated. Some dermatitis on lower legs. 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 Psychiatric: The patient has a normal mood and affect.  BP 130/72   Pulse 65   Wt 236 lb 9.6 oz (107.3 kg)   BMI 32.09 kg/m  Wt Readings from Last 3 Encounters:  04/17/20 236 lb 9.6 oz (107.3 kg)  02/06/20 229 lb 15 oz (104.3 kg)  12/23/19 230 lb (104.3 kg)     Health Maintenance Due  Topic Date Due  . COVID-19 Vaccine (1) Never done  . TETANUS/TDAP  Never done  . PNA vac Low Risk Adult (1 of 2 - PCV13) Never done    There are no preventive care reminders to display for this patient.  No results found for: TSH Lab Results  Component Value Date   WBC 6.7 02/04/2020   HGB 11.7 (L) 02/04/2020   HCT 34.6 (L) 02/04/2020   MCV 100.3 (H) 02/04/2020   PLT 158 02/04/2020   Lab Results  Component Value Date   NA 135 02/04/2020   K 3.8 02/04/2020   CO2 22 02/04/2020   GLUCOSE 106 (H) 02/04/2020   BUN 22 02/04/2020   CREATININE 1.37 (H) 02/04/2020   BILITOT 0.8 05/09/2019   ALKPHOS 70 05/09/2019   AST 31 05/09/2019   ALT 41 05/09/2019   PROT 5.3 (L) 05/09/2019   ALBUMIN 2.6 (L) 05/09/2019   CALCIUM 8.3 (L) 02/04/2020   ANIONGAP 9 02/04/2020   No results found for: CHOL No results found for: HDL No results found for: [facility] Lab Results  Component Value Date   TRIG 106 05/09/2019   No results found for: CHOLHDL No results found for: QTMA2Q    Assessment & Plan:   Problem List Items Addressed This Visit      Cardiovascular and Mediastinum   Hypertension    Blood pressure is under control on present medication.      Varicose veins of leg with swelling, left - Primary    Patient has varicose veins of both legs left more than the right.  He has 1+  pedal edema of the right leg and 2+ on the left side.        Other   Swelling of limb   Colostomy hernia (HCC)    Colostomy has been closed recently, he is not wearing the colostomy right now,                the healing process has been good.  He was found to have a small hernia at the site of the closure of the colostomy along with a ventral hernia of the abdomen which is not causing any problem right now         No orders of the defined types were placed in this encounter.  1. Varicose veins of leg with swelling, left No Evidence of phlebitis.  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  2. Essential hypertension Pressure is under control.  3. Colostomy hernia (HCC) Small hernia on the colostomy site there is no evidence of infection.  4. Swelling of limb Due to varicose veins with no evidence of DVT Follow-up: Return in about 3 months (around 07/18/2020).   By signing my name below, I, Pietro Cassis, attest that this documentation has been prepared under the direction and in the presence of Corky Downs, MD. Electronically Signed: Pietro Cassis, Medical Scribe. 04/17/20. 10:03 AM.  I personally performed the services described in this documentation, which was SCRIBED in my presence. The recorded information has been reviewed and considered accurate. It has been edited as necessary during review. Corky Downs, MD

## 2020-04-17 NOTE — Assessment & Plan Note (Signed)
Patient has varicose veins of both legs left more than the right.  He has 1+ pedal edema of the right leg and 2+ on the left side.

## 2020-04-17 NOTE — Assessment & Plan Note (Signed)
Colostomy has been closed recently, he is not wearing the colostomy right now,                the healing process has been good.  He was found to have a small hernia at the site of the closure of the colostomy along with a ventral hernia of the abdomen which is not causing any problem right now

## 2020-05-29 DIAGNOSIS — I10 Essential (primary) hypertension: Secondary | ICD-10-CM | POA: Diagnosis not present

## 2020-05-29 DIAGNOSIS — I4819 Other persistent atrial fibrillation: Secondary | ICD-10-CM | POA: Diagnosis not present

## 2020-05-29 DIAGNOSIS — I350 Nonrheumatic aortic (valve) stenosis: Secondary | ICD-10-CM | POA: Diagnosis not present

## 2020-06-19 ENCOUNTER — Other Ambulatory Visit: Payer: Self-pay | Admitting: Internal Medicine

## 2020-07-12 DIAGNOSIS — D2262 Melanocytic nevi of left upper limb, including shoulder: Secondary | ICD-10-CM | POA: Diagnosis not present

## 2020-07-12 DIAGNOSIS — L821 Other seborrheic keratosis: Secondary | ICD-10-CM | POA: Diagnosis not present

## 2020-07-12 DIAGNOSIS — D2261 Melanocytic nevi of right upper limb, including shoulder: Secondary | ICD-10-CM | POA: Diagnosis not present

## 2020-07-12 DIAGNOSIS — C44622 Squamous cell carcinoma of skin of right upper limb, including shoulder: Secondary | ICD-10-CM | POA: Diagnosis not present

## 2020-07-12 DIAGNOSIS — D225 Melanocytic nevi of trunk: Secondary | ICD-10-CM | POA: Diagnosis not present

## 2020-07-12 DIAGNOSIS — D485 Neoplasm of uncertain behavior of skin: Secondary | ICD-10-CM | POA: Diagnosis not present

## 2020-07-12 DIAGNOSIS — D0461 Carcinoma in situ of skin of right upper limb, including shoulder: Secondary | ICD-10-CM | POA: Diagnosis not present

## 2020-07-29 ENCOUNTER — Other Ambulatory Visit: Payer: Self-pay | Admitting: Internal Medicine

## 2020-08-07 DIAGNOSIS — C44622 Squamous cell carcinoma of skin of right upper limb, including shoulder: Secondary | ICD-10-CM | POA: Diagnosis not present

## 2020-08-14 ENCOUNTER — Ambulatory Visit (INDEPENDENT_AMBULATORY_CARE_PROVIDER_SITE_OTHER): Payer: Medicare Other | Admitting: Internal Medicine

## 2020-08-14 ENCOUNTER — Other Ambulatory Visit: Payer: Self-pay

## 2020-08-14 ENCOUNTER — Encounter: Payer: Self-pay | Admitting: Internal Medicine

## 2020-08-14 VITALS — BP 128/77 | HR 65 | Ht 72.0 in | Wt 237.7 lb

## 2020-08-14 DIAGNOSIS — I83892 Varicose veins of left lower extremities with other complications: Secondary | ICD-10-CM | POA: Diagnosis not present

## 2020-08-14 DIAGNOSIS — I10 Essential (primary) hypertension: Secondary | ICD-10-CM | POA: Diagnosis not present

## 2020-08-14 DIAGNOSIS — M7989 Other specified soft tissue disorders: Secondary | ICD-10-CM

## 2020-08-14 DIAGNOSIS — E669 Obesity, unspecified: Secondary | ICD-10-CM | POA: Insufficient documentation

## 2020-08-14 DIAGNOSIS — K9409 Other complications of colostomy: Secondary | ICD-10-CM | POA: Diagnosis not present

## 2020-08-14 NOTE — Patient Instructions (Signed)
Understanding How COVID-19 Vaccines Work Updated Apr 05, 2020  What You Need to Know . COVID-19 vaccines are safe and effective. . You may have side effects after vaccination, but these are normal. . It typically takes two weeks after you are fully vaccinated for the body to build protection (immunity) against the virus that causes COVID-19. . If you are not vaccinated, find a vaccine. Keep taking all precautions until you are fully vaccinated. . If you are fully vaccinated, you can resume activities that you did prior to the pandemic. Learn more about what you can do when you have been fully vaccinated.  Vaccine sticker and vial The Immune System--the Body's Defense Against Infection To understand how COVID-19 vaccines work, it helps to first look at how our bodies fight illness. When germs, such as the virus that causes COVID-19, invade our bodies, they attack and multiply. This invasion, called an infection, is what causes illness. Our immune system uses several tools to fight infection. Blood contains red cells, which carry oxygen to tissues and organs, and white or immune cells, which fight infection. Different types of white blood cells fight infection in different ways:  . Macrophages are white blood cells that swallow up and digest germs and dead or dying cells. The macrophages leave behind parts of the invading germs, called "antigens". The body identifies antigens as dangerous and stimulates antibodies to attack them. . B-lymphocytes are defensive white blood cells. They produce antibodies that attack the pieces of the virus left behind by the macrophages. . T-lymphocytes are another type of defensive white blood cell. They attack cells in the body that have already been infected. . The first time a person is infected with the virus that causes COVID-19, it can take several days or weeks for their body to make and use all the germ-fighting tools needed to get over the infection. After the  infection, the person's immune system remembers what it learned about how to protect the body against that disease.  The body keeps a few T-lymphocytes, called "memory cells," that go into action quickly if the body encounters the same virus again. When the familiar antigens are detected, B-lymphocytes produce antibodies to attack them. Experts are still learning how long these memory cells protect a person against the virus that causes COVID-19.  How COVID-19 Vaccines Work COVID-19 vaccines help our bodies develop immunity to the virus that causes COVID-19 without us having to get the illness.  COVID vaccine Different types of vaccines work in different ways to offer protection. But with all types of vaccines, the body is left with a supply of "memory" T-lymphocytes as well as B-lymphocytes that will remember how to fight that virus in the future.  It typically takes a few weeks after vaccination for the body to produce T-lymphocytes and B-lymphocytes. Therefore, it is possible that a person could be infected with the virus that causes COVID-19 just before or just after vaccination and then get sick because the vaccine did not have enough time to provide protection.  Sometimes after vaccination, the process of building immunity can cause symptoms, such as fever. These symptoms are normal and are signs that the body is building immunity.  Learn more about getting your vaccine.  Types of Vaccines Currently, there are three main types of COVID-19 vaccines that are authorized and recommended or undergoing large-scale (Phase 3) clinical trials in the [place].  Below is a description of how each type of vaccine prompts our bodies to recognize and   protect us from the virus that causes COVID-19. None of these vaccines can give you COVID-19.  . mRNA vaccines contain material from the virus that causes COVID-19 that gives our cells instructions for how to make a harmless protein that is unique to  the virus. After our cells make copies of the protein, they destroy the genetic material from the vaccine. Our bodies recognize that the protein should not be there and build T-lymphocytes and B-lymphocytes that will remember how to fight the virus that causes COVID-19 if we are infected in the future. . Protein subunit vaccines include harmless pieces (proteins) of the virus that causes COVID-19 instead of the entire germ. Once vaccinated, our bodies recognize that the protein should not be there and build T-lymphocytes and antibodies that will remember how to fight the virus that causes COVID-19 if we are infected in the future. . Vector vaccines contain a modified version of a different virus than the one that causes COVID-19. Inside the shell of the modified virus, there is material from the virus that causes COVID-19. This is called a "viral vector." Once the viral vector is inside our cells, the genetic material gives cells instructions to make a protein that is unique to the virus that causes COVID-19. Using these instructions, our cells make copies of the protein. This prompts our bodies to build T-lymphocytes and B-lymphocytes that will remember how to fight that virus if we are infected in the future.  Some COVID-19 Vaccines Require More Than One Shot To be fully vaccinated, you will need two shots of some COVID-19 vaccines.  . Two shots: If you get a COVID-19 vaccine that requires two shots, you are considered fully vaccinated two weeks after your second shot. Pfizer-BioNTech and Moderna COVID-19 vaccines require two shots. . One Shot: If you get a COVID-19 vaccine that requires one shot, you are considered fully vaccinated two weeks after your shot. Johnson & Johnson's Janssen COVID-19 vaccine only requires one shot. . If it has been less than two weeks since your shot, or if you still need to get your second shot, you are NOT fully protected. Keep taking steps to protect yourself and others  until you are fully vaccinated (two weeks after your final shot). . Booster: A booster shot has been approved for those that have received the Pfizer vaccine, and it is recommended for those that meet CDC guidelines for it.  Do I still need to get the COVID-19 vaccine if I was diagnosed with and recovered from COVID-19? Yes, you should be vaccinated regardless of whether you already had COVID-19 because: . Research has not yet shown how long you are protected from getting COVID-19 again after you recover from COVID-19. . Vaccination helps protect you even if you've already had COVID-19. . Evidence is emerging that people get better protection by being fully vaccinated compared with having had COVID-19. One study showed that unvaccinated people who already had COVID-19 are more than 2 times as likely than fully vaccinated people to get COVID-19 again.  If you were treated for COVID-19 with monoclonal antibodies or convalescent plasma, you should wait 90 days before getting a COVID-19 vaccine. Talk to your doctor if you are unsure what treatments you received or if you have more questions about getting a COVID-19 vaccine.  Where to get the Vaccine You can get a COVID-19 vaccine from any participating pharmacy.  You can also register for a vaccine with [facility] at:  [facility] - ages   18 and older; Phone: [phone]   [facility] - ages 18 and older; Phone: [phone]  [facility] - ages 18 and above; Phone: [phone]  For additional locations and clinics for ages 12 and younger, please visit:  [email or web address]   Information from: https://www.cdc.gov/coronavirus/2019-ncov/vaccines/different-vaccines/how-[email or web address].html https://www.cdc.gov/coronavirus/2019-ncov/vaccines/faq.html#:~:text=No.,the%20criteria%20before%20getting%20vaccinated  

## 2020-08-14 NOTE — Assessment & Plan Note (Signed)
-   I encouraged the patient to lose weight.  - I educated them on making healthy dietary choices including eating more fruits and vegetables and less fried foods. - I encouraged the patient to exercise more, and educated on the benefits of exercise including weight loss, diabetes management, and hypertension management.   

## 2020-08-14 NOTE — Assessment & Plan Note (Addendum)
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

## 2020-08-14 NOTE — Assessment & Plan Note (Signed)
Patient has edema of the both legs with varicose veins dermatitis.  He has seen the vascular surgeon in the past he does not want to do any kind of vascular procedure at the present time.

## 2020-08-14 NOTE — Progress Notes (Signed)
Established Patient Office Visit  SUBJECTIVE:  Subjective  Patient ID: Mark Blevins, male    DOB: [birth year]  Age: 79 y.o. MRN: [id number]  CC:  Chief Complaint  Patient presents with  . Hypertension    4 month BP follow up    HPI Mark Blevins is a 79 y.o. male presenting today for a hypertension follow up.  His blood pressure today is 128/77. He has been taking his medication without difficulty and he denies any missed doses. He denies chest pain or   He is not vaccinated against COVID19; he is unsure about whether or not he should get it. He is not vaccinated against influenza and does not intent to get vaccinated for this.   Past Medical History:  Diagnosis Date  . BPH (benign prostatic hyperplasia)   . Hypertension   . Venous reflux     Past Surgical History:  Procedure Laterality Date  . APPENDECTOMY    . BACK SURGERY    . COLECTOMY N/A 04/27/2019   Procedure: COLECTOMY WITH COLOSTOMY;  Surgeon: Sung Amabile, DO;  Location: [facility];  Service: General;  Laterality: N/A;  . COLONOSCOPY WITH PROPOFOL N/A 04/24/2019   Procedure: COLONOSCOPY WITH PROPOFOL;  Surgeon: Sung Amabile, DO;  Location: [facility];  Service: General;  Laterality: N/A;  . LAPAROSCOPIC LYSIS OF ADHESIONS  05/06/2019   Procedure: LAPAROSCOPIC LYSIS OF ADHESIONS;  Surgeon: Sung Amabile, DO;  Location: [facility];  Service: General;;  . LAPAROSCOPY N/A 05/06/2019   Procedure: LAPAROSCOPY DIAGNOSTIC;  Surgeon: Sung Amabile, DO;  Location: [facility];  Service: General;  Laterality: N/A;  . REPLACEMENT TOTAL KNEE BILATERAL    . XI ROBOTIC ASSISTED COLOSTOMY TAKEDOWN N/A 02/03/2020   Procedure: XI ROBOTIC ASSISTED COLOSTOMY TAKEDOWN;  Surgeon: Sung Amabile, DO;  Location: [facility];  Service: General;  Laterality: N/A;    History reviewed. No pertinent family history.  Social History   Socioeconomic History  . Marital status: Married    Spouse name: Not on file  . Number of children: Not on file  .  Years of education: Not on file  . Highest education level: Not on file  Occupational History  . Not on file  Tobacco Use  . Smoking status: Former Games developer  . Smokeless tobacco: Current User    Types: Chew  . Tobacco comment: quit smoking 1969 or 1970  Vaping Use  . Vaping Use: Never used  Substance and Sexual Activity  . Alcohol use: Yes    Comment: socially. 1beer yesterday  . Drug use: Never  . Sexual activity: Not on file  Other Topics Concern  . Not on file  Social History Narrative  . Not on file   Social Determinants of Health   Financial Resource Strain:   . Difficulty of Paying Living Expenses: Not on file  Food Insecurity:   . Worried About Programme researcher, broadcasting/film/video in the Last Year: Not on file  . Ran Out of Food in the Last Year: Not on file  Transportation Needs:   . Lack of Transportation (Medical): Not on file  . Lack of Transportation (Non-Medical): Not on file  Physical Activity:   . Days of Exercise per Week: Not on file  . Minutes of Exercise per Session: Not on file  Stress:   . Feeling of Stress : Not on file  Social Connections:   . Frequency of Communication with Friends and Family: Not on file  . Frequency of Social Gatherings with Friends  and Family: Not on file  . Attends Religious Services: Not on file  . Active Member of Clubs or Organizations: Not on file  . Attends Banker Meetings: Not on file  . Marital Status: Not on file  Intimate Partner Violence:   . Fear of Current or Ex-Partner: Not on file  . Emotionally Abused: Not on file  . Physically Abused: Not on file  . Sexually Abused: Not on file     Current Outpatient Medications:  .  amLODipine (NORVASC) 10 MG tablet, TAKE 1 TABLET BY MOUTH DAILY, Disp: 90 tablet, Rfl: 3 .  cloNIDine (CATAPRES) 0.1 MG tablet, TAKE 1 TABLET BY MOUTH DAILY, Disp: 90 tablet, Rfl: 2 .  ELIQUIS 5 MG TABS tablet, TAKE 1 TABLET BY MOUTH TWICE DAILY, Disp: 60 tablet, Rfl: 6 .  labetalol (NORMODYNE)  200 MG tablet, Take 200 mg by mouth every morning. , Disp: , Rfl: 3 .  Multiple Vitamin (MULTIVITAMIN WITH MINERALS) TABS tablet, Take 1 tablet by mouth daily., Disp: , Rfl:    Allergies  Allergen Reactions  . Codeine Nausea And Vomiting  . Morphine And Related Nausea And Vomiting    ROS Review of Systems  Constitutional: Negative.   HENT: Negative.   Eyes: Negative.   Respiratory: Negative.  Negative for cough and shortness of breath.   Cardiovascular: Positive for leg swelling (L>R). Negative for chest pain.  Gastrointestinal: Negative.   Endocrine: Negative.   Genitourinary: Negative.   Musculoskeletal: Positive for arthralgias (knees).  Skin: Negative.   Allergic/Immunologic: Negative.   Neurological: Negative.   Hematological: Negative.   Psychiatric/Behavioral: Negative.   All other systems reviewed and are negative.    OBJECTIVE:    Physical Exam Vitals reviewed.  Constitutional:      Appearance: Normal appearance. He is obese.  HENT:     Mouth/Throat:     Mouth: Mucous membranes are moist.  Eyes:     Pupils: Pupils are equal, round, and reactive to light.  Neck:     Vascular: No carotid bruit.  Cardiovascular:     Rate and Rhythm: Normal rate and regular rhythm.     Pulses: Normal pulses.     Heart sounds: Normal heart sounds.  Pulmonary:     Effort: Pulmonary effort is normal.     Breath sounds: Normal breath sounds.  Abdominal:     General: Bowel sounds are normal.     Palpations: Abdomen is soft. There is no hepatomegaly, splenomegaly or mass.     Tenderness: There is no abdominal tenderness.     Hernia: No hernia is present.  Musculoskeletal:     Cervical back: Neck supple.     Right lower leg: 2+ Edema present.     Left lower leg: 2+ Edema present.  Skin:    Findings: No rash.     Comments: Dermatitis in bilateral legs.   Neurological:     Mental Status: He is alert and oriented to person, place, and time.     Motor: No weakness.    Psychiatric:        Mood and Affect: Mood normal.        Behavior: Behavior normal.     BP 128/77   Pulse 65   Ht 6' (1.829 m)   Wt 237 lb 11.2 oz (107.8 kg)   BMI 32.24 kg/m  Wt Readings from Last 3 Encounters:  08/14/20 237 lb 11.2 oz (107.8 kg)  04/17/20 236 lb 9.6 oz (107.3 kg)  02/06/20 229 lb 15 oz (104.3 kg)    Health Maintenance Due  Topic Date Due  . COVID-19 Vaccine (1) Never done  . TETANUS/TDAP  Never done  . PNA vac Low Risk Adult (1 of 2 - PCV13) Never done  . INFLUENZA VACCINE  Never done    There are no preventive care reminders to display for this patient.  CBC Latest Ref Rng & Units 02/04/2020 02/03/2020 12/26/2019  WBC 4.0 - 10.5 K/uL 6.7 4.6 4.2  Hemoglobin 13.0 - 17.0 g/dL 11.7(L) 12.7(L) 12.7(L)  Hematocrit 39 - 52 % 34.6(L) 37.4(L) 37.8(L)  Platelets 150 - 400 K/uL 158 180 200   CMP Latest Ref Rng & Units 02/04/2020 02/03/2020 12/26/2019  Glucose 70 - 99 mg/dL 161(W106(H) - 960(A108(H)  BUN 8 - 23 mg/dL 22 - 18  Creatinine 5.400.61 - 1.24 mg/dL 9.81(X1.37(H) 9.14(N1.30(H) 8.29(F1.27(H)  Sodium 135 - 145 mmol/L 135 - 136  Potassium 3.5 - 5.1 mmol/L 3.8 - 4.0  Chloride 98 - 111 mmol/L 104 - 102  CO2 22 - 32 mmol/L 22 - 26  Calcium 8.9 - 10.3 mg/dL 8.3(L) - 8.7(L)  Total Protein 6.5 - 8.1 g/dL - - -  Total Bilirubin 0.3 - 1.2 mg/dL - - -  Alkaline Phos 38 - 126 U/L - - -  AST 15 - 41 U/L - - -  ALT 0 - 44 U/L - - -    No results found for: TSH Lab Results  Component Value Date   ALBUMIN 2.6 (L) 05/09/2019   ANIONGAP 9 02/04/2020   No results found for: CHOL, HDL, LDLCALC, CHOLHDL Lab Results  Component Value Date   TRIG 106 05/09/2019   No results found for: HGBA1C    ASSESSMENT & PLAN:   Problem List Items Addressed This Visit      Cardiovascular and Mediastinum   Hypertension    Patient blood pressure is 128 / 77.  He is on Normodyne and clonidine and takes his medicine regularly.  He has not gained any weight.  He also has a chronic atrial fibrillation and  taking Eliquis for that.  Patient has not taken Covid shot yet still waiting to see some more evident before he takes it      Varicose veins of leg with swelling, left - Primary    Patient has edema of the both legs with varicose veins dermatitis.  He has seen the vascular surgeon in the past he does not want to do any kind of vascular procedure at the present time.        Other   Swelling of limb   Colostomy hernia (HCC)    Patient has a small colostomy hernia on the left side he has surgery for the volvulus which left him with a colostomy bag  but he still has a hernia.      Obesity (BMI 30-39.9)    - I encouraged the patient to lose weight.  - I educated them on making healthy dietary choices including eating more fruits and vegetables and less fried foods. - I encouraged the patient to exercise more, and educated on the benefits of exercise including weight loss, diabetes management, and hypertension management.           No orders of the defined types were placed in this encounter.   Follow-up: Return in about 3 months (around 11/14/2020) for F/U:HTN.    Corky DownsJaved Masoud, MD [facility] [address], [place], [place] [id number]  By signing my name below, I, [employer], attest that this documentation has been prepared under the direction and in the presence of Dr. Corky Downs Electronically Signed: Corky Downs, MD 08/14/20, 9:25 AM  I personally performed the services described in this documentation, which was SCRIBED in my presence. The recorded information has been reviewed and considered accurate. It has been edited as necessary during review. Corky Downs, MD

## 2020-08-14 NOTE — Assessment & Plan Note (Signed)
Patient blood pressure is 128 / 77.  He is on Normodyne and clonidine and takes his medicine regularly.  He has not gained any weight.  He also has a chronic atrial fibrillation and taking Eliquis for that.  Patient has not taken Covid shot yet still waiting to see some more evident before he takes it

## 2020-08-20 ENCOUNTER — Other Ambulatory Visit: Payer: Self-pay | Admitting: Internal Medicine

## 2020-08-22 DIAGNOSIS — D0439 Carcinoma in situ of skin of other parts of face: Secondary | ICD-10-CM | POA: Diagnosis not present

## 2020-08-22 DIAGNOSIS — D0461 Carcinoma in situ of skin of right upper limb, including shoulder: Secondary | ICD-10-CM | POA: Diagnosis not present

## 2020-08-22 DIAGNOSIS — D485 Neoplasm of uncertain behavior of skin: Secondary | ICD-10-CM | POA: Diagnosis not present

## 2020-09-26 DIAGNOSIS — D0439 Carcinoma in situ of skin of other parts of face: Secondary | ICD-10-CM | POA: Diagnosis not present

## 2020-10-13 ENCOUNTER — Other Ambulatory Visit: Payer: Self-pay | Admitting: Internal Medicine

## 2020-10-20 IMAGING — CT CT ABDOMEN AND PELVIS WITH CONTRAST
2 of 5 series · 16 of 46 positions shown, 18 images · IV contrast (APPLIED)
Comparison: None.

CLINICAL DATA: Worsening abdominal pain and bloating for several
days. Bowel obstruction.

EXAM:
CT ABDOMEN AND PELVIS WITH CONTRAST
TECHNIQUE: Multidetector CT imaging of the abdomen and pelvis was performed
using the standard protocol following bolus administration of
intravenous contrast.
CONTRAST:  125mL OMNIPAQUE IOHEXOL 300 MG/ML  SOLN

[Series 2: routine abd/pel with · axial · 0.89mm/px · z∈[-1092,-627]mm · 13 of 105 slices shown, 15 images]
[im 6/105  soft-tissue]
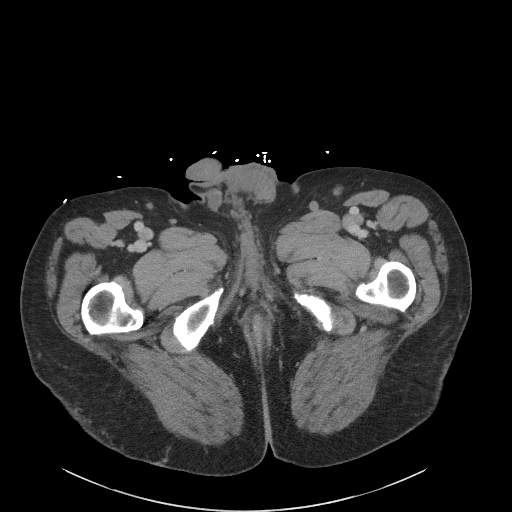
[im 6/105  bone]
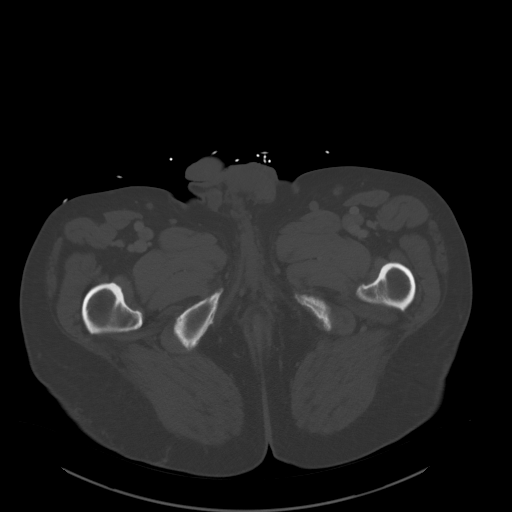
[im 12/105  soft-tissue]
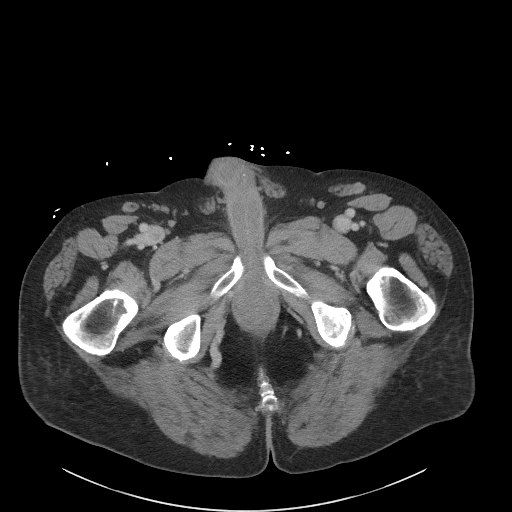
[im 24/105  soft-tissue]
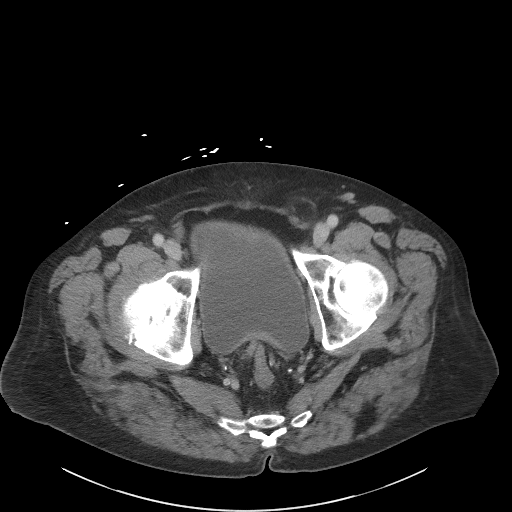
[im 29/105  soft-tissue]
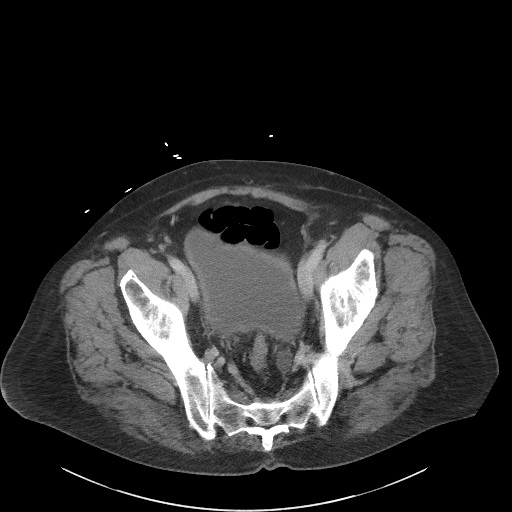
[im 35/105  soft-tissue]
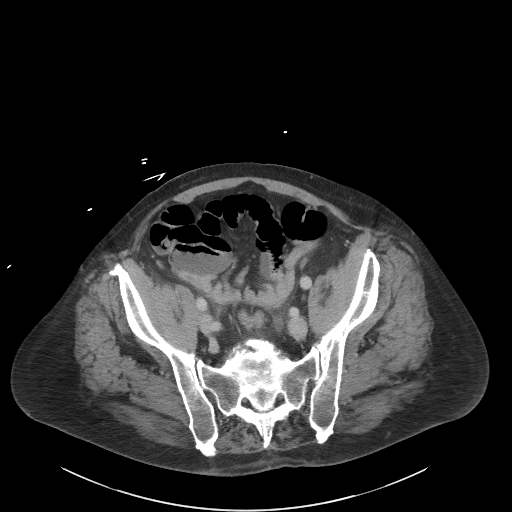
[im 47/105  soft-tissue]
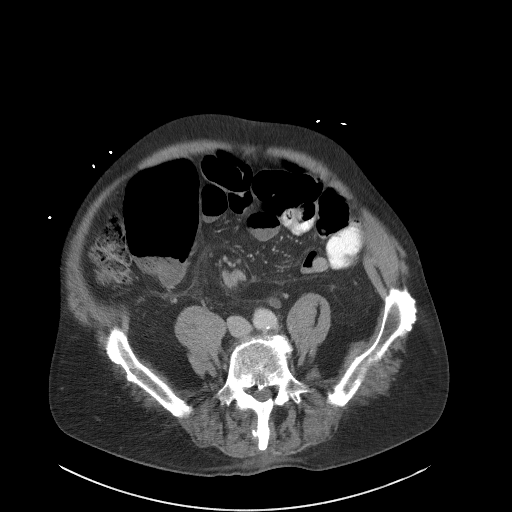
[im 53/105  soft-tissue]
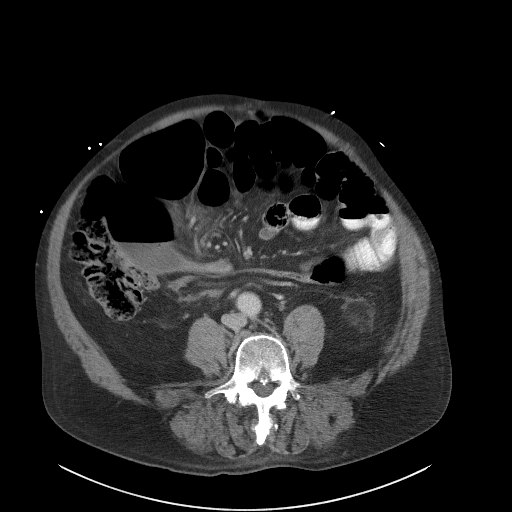
[im 58/105  soft-tissue]
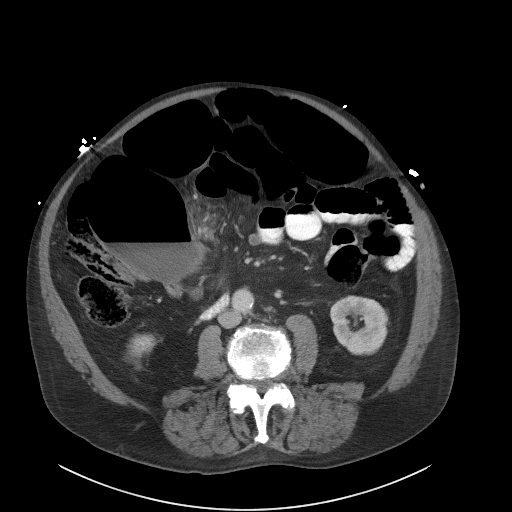
[im 70/105  soft-tissue]
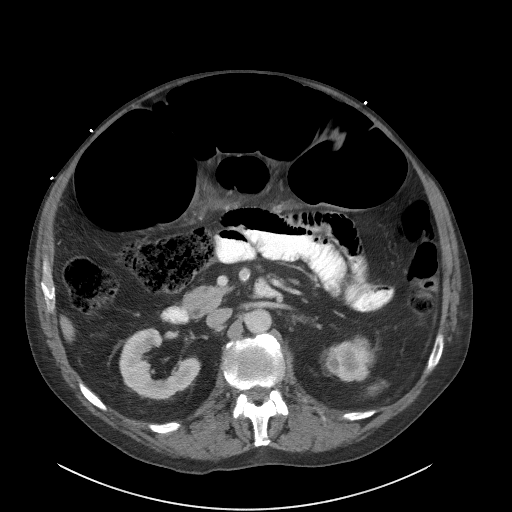
[im 70/105  bone]
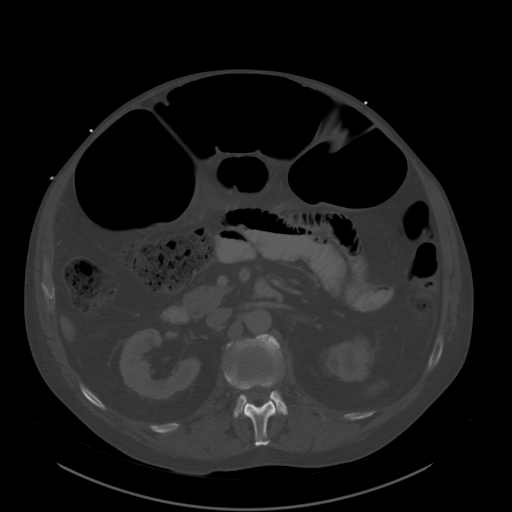
[im 76/105  soft-tissue]
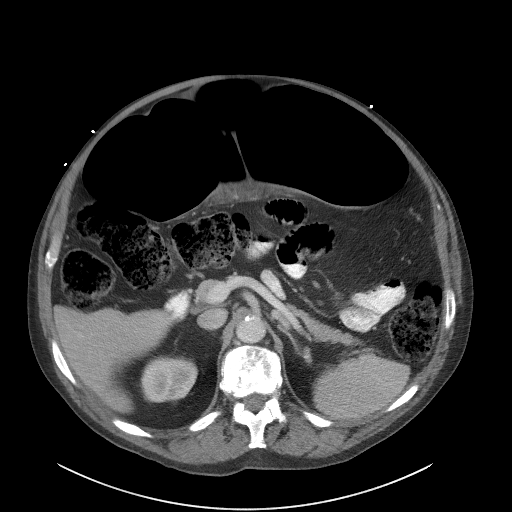
[im 81/105  soft-tissue]
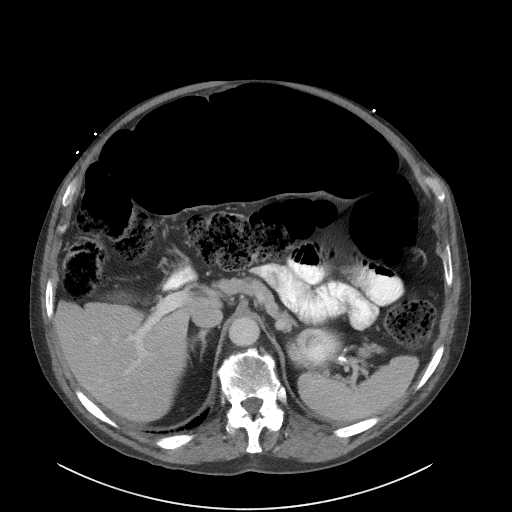
[im 93/105  soft-tissue]
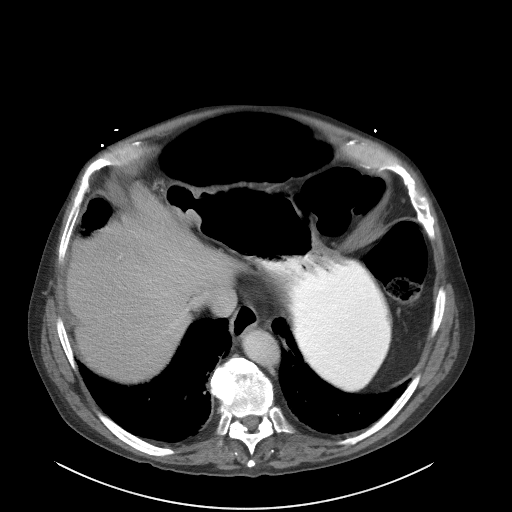
[im 99/105  soft-tissue]
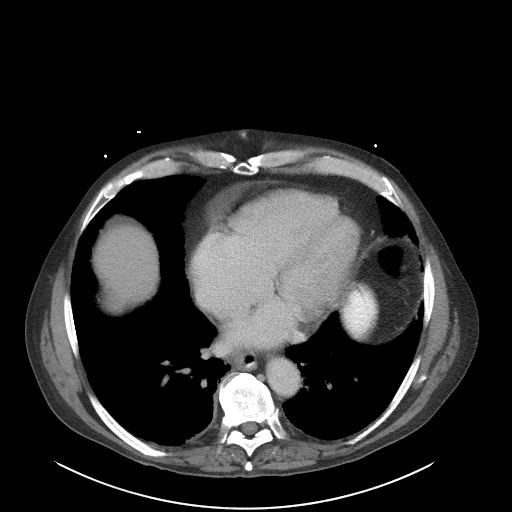

[Series 5: coronal st · coronal · 0.87mm/px · 3 of 116 slices shown]
[im 39/116  soft-tissue]
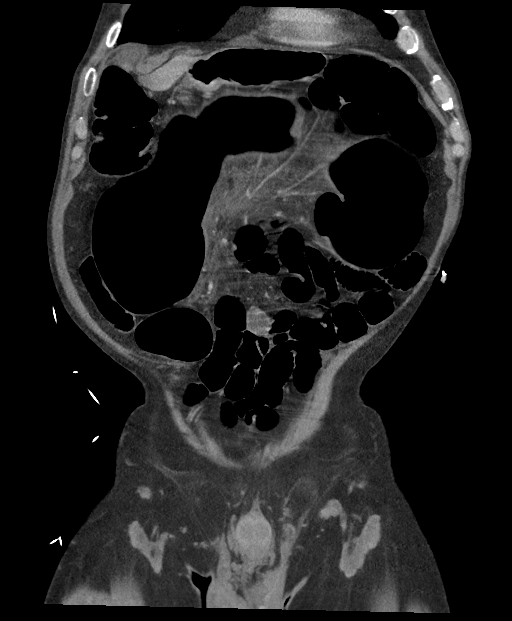
[im 52/116  soft-tissue]
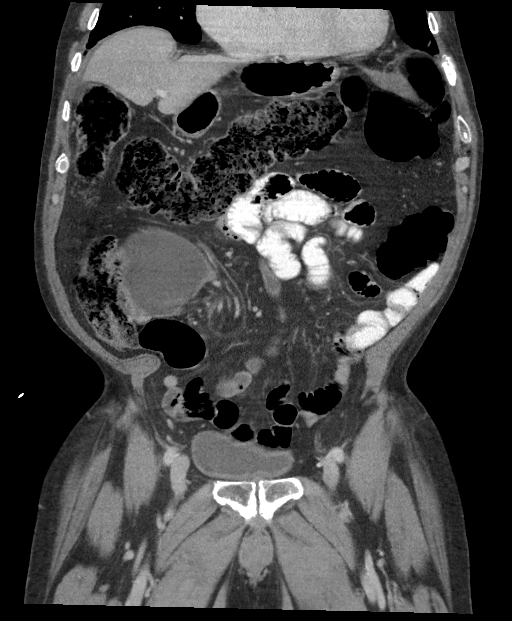
[im 64/116  soft-tissue]
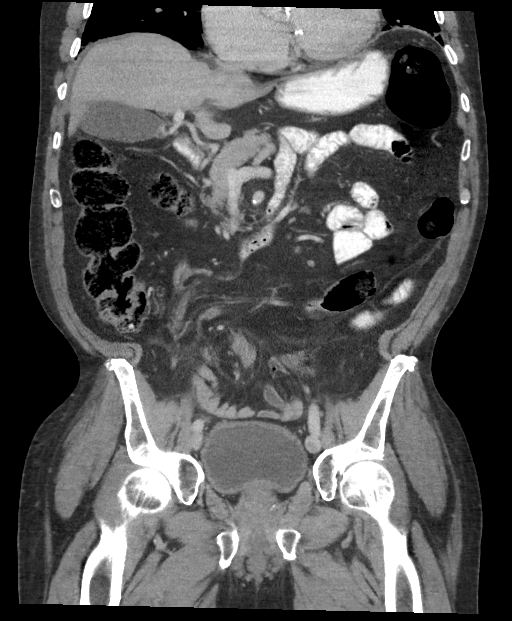

[16 of 46 positions shown; findings below may reference images not displayed]

FINDINGS: Lower Chest: No acute findings.

Hepatobiliary: No hepatic masses identified. Gallbladder is
unremarkable.

Pancreas:  No mass or inflammatory changes.

Spleen: Within normal limits in size and appearance.

Adrenals/Urinary Tract: No masses identified. Ill-defined areas of
decreased parenchymal enhancement are seen involving the left kidney
as well as mild perinephric stranding. These findings are suspicious
for pyelonephritis. No evidence of ureteral calculi or
hydronephrosis. Unremarkable unopacified urinary bladder.

Stomach/Bowel: Sigmoid volvulus is demonstrated with marked
dilatation of the sigmoid colon and edema within the sigmoid
mesentery. No evidence of bowel wall thickening, pneumatosis, or
free intraperitoneal air. No evidence of abscess or free fluid.

Vascular/Lymphatic: No pathologically enlarged lymph nodes. No
abdominal aortic aneurysm. Aortic atherosclerosis.

Reproductive:  No mass or other significant abnormality.

Other:  None.

Musculoskeletal:  No suspicious bone lesions identified.
IMPRESSION: Acute sigmoid volvulus.

Findings suspicious for left-sided pyelonephritis. No evidence of
ureteral calculi or hydronephrosis. Suggest correlation with
urinalysis.

Critical Value/emergent results were called by telephone at the time
of interpretation on 04/24/2019 at [DATE] to Dr. CAIN BARSOUM
, who verbally acknowledged these results.

## 2020-10-24 ENCOUNTER — Ambulatory Visit: Payer: Medicare Other | Admitting: Internal Medicine

## 2020-11-13 DIAGNOSIS — I10 Essential (primary) hypertension: Secondary | ICD-10-CM | POA: Diagnosis not present

## 2020-11-13 DIAGNOSIS — I350 Nonrheumatic aortic (valve) stenosis: Secondary | ICD-10-CM | POA: Diagnosis not present

## 2020-11-13 DIAGNOSIS — I4819 Other persistent atrial fibrillation: Secondary | ICD-10-CM | POA: Diagnosis not present

## 2020-11-13 DIAGNOSIS — Z23 Encounter for immunization: Secondary | ICD-10-CM | POA: Diagnosis not present

## 2020-11-28 ENCOUNTER — Encounter: Payer: Medicare Other | Admitting: Internal Medicine

## 2020-11-28 ENCOUNTER — Ambulatory Visit: Payer: Medicare Other | Admitting: Internal Medicine

## 2020-12-05 ENCOUNTER — Encounter: Payer: Self-pay | Admitting: Internal Medicine

## 2020-12-05 ENCOUNTER — Other Ambulatory Visit: Payer: Self-pay

## 2020-12-05 ENCOUNTER — Ambulatory Visit: Payer: Medicare Other | Admitting: Internal Medicine

## 2020-12-05 VITALS — BP 117/69 | HR 61 | Ht 73.0 in | Wt 243.3 lb

## 2020-12-05 DIAGNOSIS — E669 Obesity, unspecified: Secondary | ICD-10-CM

## 2020-12-05 DIAGNOSIS — I10 Essential (primary) hypertension: Secondary | ICD-10-CM

## 2020-12-05 DIAGNOSIS — I83892 Varicose veins of left lower extremities with other complications: Secondary | ICD-10-CM

## 2020-12-05 NOTE — Assessment & Plan Note (Signed)
Blood pressure is under control on present medication.  Patient is following low-carb low salt diet.

## 2020-12-05 NOTE — Assessment & Plan Note (Signed)
Patient was advised to continue losing weight.

## 2020-12-05 NOTE — Progress Notes (Signed)
Established Patient Office Visit  Subjective:  Patient ID: Mark Blevins, male    DOB: [birth year]  Age: 80 y.o. MRN: [id number]  CC:  Chief Complaint  Patient presents with  . Follow-up    HPI  Mark Blevins presents for a general checkup.  He is known to have obesity hypertensive cardiovascular disease and swelling of the legs due to venous stasis.  Patient has benign prostatic hypertrophyHe has a history of volvulus of sigmoid colon.  His blood pressure medication include amlodipine clonidine he also takes Eliquis.  He does not smoke anymore does not drink.  Past Medical History:  Diagnosis Date  . BPH (benign prostatic hyperplasia)   . Hypertension   . Venous reflux     Past Surgical History:  Procedure Laterality Date  . APPENDECTOMY    . BACK SURGERY    . COLECTOMY N/A 04/27/2019   Procedure: COLECTOMY WITH COLOSTOMY;  Surgeon: Sung Amabile, DO;  Location: [facility];  Service: General;  Laterality: N/A;  . COLONOSCOPY WITH PROPOFOL N/A 04/24/2019   Procedure: COLONOSCOPY WITH PROPOFOL;  Surgeon: Sung Amabile, DO;  Location: [facility];  Service: General;  Laterality: N/A;  . LAPAROSCOPIC LYSIS OF ADHESIONS  05/06/2019   Procedure: LAPAROSCOPIC LYSIS OF ADHESIONS;  Surgeon: Sung Amabile, DO;  Location: [facility];  Service: General;;  . LAPAROSCOPY N/A 05/06/2019   Procedure: LAPAROSCOPY DIAGNOSTIC;  Surgeon: Sung Amabile, DO;  Location: [facility];  Service: General;  Laterality: N/A;  . REPLACEMENT TOTAL KNEE BILATERAL    . XI ROBOTIC ASSISTED COLOSTOMY TAKEDOWN N/A 02/03/2020   Procedure: XI ROBOTIC ASSISTED COLOSTOMY TAKEDOWN;  Surgeon: Sung Amabile, DO;  Location: [facility];  Service: General;  Laterality: N/A;    History reviewed. No pertinent family history.  Social History   Socioeconomic History  . Marital status: Married    Spouse name: Not on file  . Number of children: Not on file  . Years of education: Not on file  . Highest education level: Not on file   Occupational History  . Not on file  Tobacco Use  . Smoking status: Former Games developer  . Smokeless tobacco: Current User    Types: Chew  . Tobacco comment: quit smoking 1969 or 1970  Vaping Use  . Vaping Use: Never used  Substance and Sexual Activity  . Alcohol use: Yes    Comment: socially. 1beer yesterday  . Drug use: Never  . Sexual activity: Not on file  Other Topics Concern  . Not on file  Social History Narrative  . Not on file   Social Determinants of Health   Financial Resource Strain: Not on file  Food Insecurity: Not on file  Transportation Needs: Not on file  Physical Activity: Not on file  Stress: Not on file  Social Connections: Not on file  Intimate Partner Violence: Not on file     Current Outpatient Medications:  .  amLODipine (NORVASC) 10 MG tablet, TAKE 1 TABLET BY MOUTH DAILY, Disp: 90 tablet, Rfl: 3 .  cloNIDine (CATAPRES) 0.1 MG tablet, TAKE 1 TABLET BY MOUTH DAILY, Disp: 90 tablet, Rfl: 2 .  ELIQUIS 5 MG TABS tablet, TAKE 1 TABLET BY MOUTH TWICE DAILY, Disp: 60 tablet, Rfl: 6 .  labetalol (NORMODYNE) 200 MG tablet, TAKE 1 TABLET BY MOUTH DAILY, Disp: 90 tablet, Rfl: 3 .  Multiple Vitamin (MULTIVITAMIN WITH MINERALS) TABS tablet, Take 1 tablet by mouth daily., Disp: , Rfl:    Allergies  Allergen Reactions  . Codeine  Nausea And Vomiting  . Morphine And Related Nausea And Vomiting    ROS Review of Systems  Constitutional: Negative.   HENT: Negative.   Eyes: Negative.   Respiratory: Negative.   Cardiovascular: Negative.   Gastrointestinal: Negative.   Endocrine: Negative.   Genitourinary: Negative.   Musculoskeletal: Negative.   Skin: Negative.   Allergic/Immunologic: Negative.   Neurological: Negative.   Hematological: Negative.   Psychiatric/Behavioral: Negative.   All other systems reviewed and are negative.     Objective:    Physical Exam Vitals reviewed.  Constitutional:      Appearance: Normal appearance.  HENT:      Mouth/Throat:     Mouth: Mucous membranes are moist.  Eyes:     Pupils: Pupils are equal, round, and reactive to light.  Neck:     Vascular: No carotid bruit.  Cardiovascular:     Rate and Rhythm: Normal rate and regular rhythm.     Pulses: Normal pulses.     Heart sounds: Normal heart sounds.  Pulmonary:     Effort: Pulmonary effort is normal.     Breath sounds: Normal breath sounds.  Abdominal:     General: Bowel sounds are normal.     Palpations: Abdomen is soft. There is no hepatomegaly, splenomegaly or mass.     Tenderness: There is no abdominal tenderness.     Hernia: No hernia is present.  Musculoskeletal:     Cervical back: Neck supple.     Right lower leg: No edema.     Left lower leg: No edema.  Skin:    Findings: No rash.  Neurological:     Mental Status: He is alert and oriented to person, place, and time.     Motor: No weakness.  Psychiatric:        Mood and Affect: Mood normal.        Behavior: Behavior normal.     BP 117/69   Pulse 61   Ht 6\' 1"  (1.854 m)   Wt 243 lb 4.8 oz (110.4 kg)   BMI 32.10 kg/m  Wt Readings from Last 3 Encounters:  12/05/20 243 lb 4.8 oz (110.4 kg)  08/14/20 237 lb 11.2 oz (107.8 kg)  04/17/20 236 lb 9.6 oz (107.3 kg)     Health Maintenance Due  Topic Date Due  . COVID-19 Vaccine (1) Never done  . TETANUS/TDAP  Never done  . PNA vac Low Risk Adult (1 of 2 - PCV13) Never done  . INFLUENZA VACCINE  Never done    There are no preventive care reminders to display for this patient.  No results found for: TSH Lab Results  Component Value Date   WBC 6.7 02/04/2020   HGB 11.7 (L) 02/04/2020   HCT 34.6 (L) 02/04/2020   MCV 100.3 (H) 02/04/2020   PLT 158 02/04/2020   Lab Results  Component Value Date   NA 135 02/04/2020   K 3.8 02/04/2020   CO2 22 02/04/2020   GLUCOSE 106 (H) 02/04/2020   BUN 22 02/04/2020   CREATININE 1.37 (H) 02/04/2020   BILITOT 0.8 05/09/2019   ALKPHOS 70 05/09/2019   AST 31 05/09/2019   ALT  41 05/09/2019   PROT 5.3 (L) 05/09/2019   ALBUMIN 2.6 (L) 05/09/2019   CALCIUM 8.3 (L) 02/04/2020   ANIONGAP 9 02/04/2020   No results found for: CHOL No results found for: HDL No results found for: [facility] Lab Results  Component Value Date   TRIG 106 05/09/2019  No results found for: CHOLHDL No results found for: JHER7E    Assessment & Plan:   Problem List Items Addressed This Visit      Cardiovascular and Mediastinum   Hypertension    Blood pressure is under control on present medication.  Patient is following low-carb low salt diet.      Varicose veins of leg with swelling, left - Primary    Patient takes Eliquis swelling of the leg is down.  He denies any history of chest pain or shortness of breath.  There is no tenderness in the calf.        Other   Obesity (BMI 30-39.9)    Patient was advised to continue losing weight.         No orders of the defined types were placed in this encounter.   Follow-up: No follow-ups on file.    Corky Downs, MD

## 2020-12-05 NOTE — Assessment & Plan Note (Signed)
Patient takes Eliquis swelling of the leg is down.  He denies any history of chest pain or shortness of breath.  There is no tenderness in the calf.

## 2020-12-25 DIAGNOSIS — D2262 Melanocytic nevi of left upper limb, including shoulder: Secondary | ICD-10-CM | POA: Diagnosis not present

## 2020-12-25 DIAGNOSIS — D2261 Melanocytic nevi of right upper limb, including shoulder: Secondary | ICD-10-CM | POA: Diagnosis not present

## 2020-12-25 DIAGNOSIS — L821 Other seborrheic keratosis: Secondary | ICD-10-CM | POA: Diagnosis not present

## 2020-12-25 DIAGNOSIS — Z85828 Personal history of other malignant neoplasm of skin: Secondary | ICD-10-CM | POA: Diagnosis not present

## 2021-01-29 DIAGNOSIS — C44629 Squamous cell carcinoma of skin of left upper limb, including shoulder: Secondary | ICD-10-CM | POA: Diagnosis not present

## 2021-04-05 ENCOUNTER — Other Ambulatory Visit: Payer: Self-pay

## 2021-04-05 ENCOUNTER — Ambulatory Visit (INDEPENDENT_AMBULATORY_CARE_PROVIDER_SITE_OTHER): Payer: Medicare Other | Admitting: Family Medicine

## 2021-04-05 ENCOUNTER — Encounter: Payer: Self-pay | Admitting: Family Medicine

## 2021-04-05 VITALS — BP 126/86 | HR 73 | Ht 73.0 in | Wt 239.3 lb

## 2021-04-05 DIAGNOSIS — L02419 Cutaneous abscess of limb, unspecified: Secondary | ICD-10-CM

## 2021-04-05 DIAGNOSIS — L03119 Cellulitis of unspecified part of limb: Secondary | ICD-10-CM | POA: Insufficient documentation

## 2021-04-05 DIAGNOSIS — I83892 Varicose veins of left lower extremities with other complications: Secondary | ICD-10-CM | POA: Diagnosis not present

## 2021-04-05 MED ORDER — CEPHALEXIN 500 MG PO CAPS: 500.0000 mg | ORAL_CAPSULE | Freq: Four times a day (QID) | ORAL | 0 refills | Status: DC

## 2021-04-05 NOTE — Assessment & Plan Note (Signed)
Patient with significant swelling left leg with open area left lower leg with mild erythema. Some purulent drainage. He has been using Eucerin cream on the area.

## 2021-04-05 NOTE — Assessment & Plan Note (Signed)
See Vericose vein note, treating with oral abx x 7 days. Will fu in 2 weeks.

## 2021-04-05 NOTE — Progress Notes (Signed)
Established Patient Office Visit  SUBJECTIVE:  Subjective  Patient ID: Mark Blevins, male    DOB: 1941/10/02  Age: 80 y.o. MRN: [id number]  CC:  Chief Complaint  Patient presents with  . left leg swelling    HPI BROK STOCKING is a 80 y.o. male presenting today for swelling and infection left lower leg.   Past Medical History:  Diagnosis Date  . BPH (benign prostatic hyperplasia)   . Hypertension   . Venous reflux     Past Surgical History:  Procedure Laterality Date  . APPENDECTOMY    . BACK SURGERY    . COLECTOMY N/A 04/27/2019   Procedure: COLECTOMY WITH COLOSTOMY;  Surgeon: Sung Amabile, DO;  Location: [facility];  Service: General;  Laterality: N/A;  . COLONOSCOPY WITH PROPOFOL N/A 04/24/2019   Procedure: COLONOSCOPY WITH PROPOFOL;  Surgeon: Sung Amabile, DO;  Location: [facility];  Service: General;  Laterality: N/A;  . LAPAROSCOPIC LYSIS OF ADHESIONS  05/06/2019   Procedure: LAPAROSCOPIC LYSIS OF ADHESIONS;  Surgeon: Sung Amabile, DO;  Location: [facility];  Service: General;;  . LAPAROSCOPY N/A 05/06/2019   Procedure: LAPAROSCOPY DIAGNOSTIC;  Surgeon: Sung Amabile, DO;  Location: [facility];  Service: General;  Laterality: N/A;  . REPLACEMENT TOTAL KNEE BILATERAL    . XI ROBOTIC ASSISTED COLOSTOMY TAKEDOWN N/A 02/03/2020   Procedure: XI ROBOTIC ASSISTED COLOSTOMY TAKEDOWN;  Surgeon: Sung Amabile, DO;  Location: [facility];  Service: General;  Laterality: N/A;    No family history on file.  Social History   Socioeconomic History  . Marital status: Married    Spouse name: Not on file  . Number of children: Not on file  . Years of education: Not on file  . Highest education level: Not on file  Occupational History  . Not on file  Tobacco Use  . Smoking status: Former Games developer  . Smokeless tobacco: Current User    Types: Chew  . Tobacco comment: quit smoking 1969 or 1970  Vaping Use  . Vaping Use: Never used  Substance and Sexual Activity  . Alcohol use: Yes     Comment: socially. 1beer yesterday  . Drug use: Never  . Sexual activity: Not on file  Other Topics Concern  . Not on file  Social History Narrative  . Not on file   Social Determinants of Health   Financial Resource Strain: Not on file  Food Insecurity: Not on file  Transportation Needs: Not on file  Physical Activity: Not on file  Stress: Not on file  Social Connections: Not on file  Intimate Partner Violence: Not on file     Current Outpatient Medications:  .  cephALEXin (KEFLEX) 500 MG capsule, Take 1 capsule (500 mg total) by mouth 4 (four) times daily., Disp: 28 capsule, Rfl: 0 .  amLODipine (NORVASC) 10 MG tablet, TAKE 1 TABLET BY MOUTH DAILY, Disp: 90 tablet, Rfl: 3 .  cloNIDine (CATAPRES) 0.1 MG tablet, TAKE 1 TABLET BY MOUTH DAILY, Disp: 90 tablet, Rfl: 2 .  ELIQUIS 5 MG TABS tablet, TAKE 1 TABLET BY MOUTH TWICE DAILY, Disp: 60 tablet, Rfl: 6 .  labetalol (NORMODYNE) 200 MG tablet, TAKE 1 TABLET BY MOUTH DAILY, Disp: 90 tablet, Rfl: 3 .  Multiple Vitamin (MULTIVITAMIN WITH MINERALS) TABS tablet, Take 1 tablet by mouth daily., Disp: , Rfl:    Allergies  Allergen Reactions  . Codeine Nausea And Vomiting  . Morphine And Related Nausea And Vomiting    ROS Review of Systems  Constitutional: Negative.   HENT: Negative.   Respiratory: Negative.   Cardiovascular: Positive for palpitations and leg swelling. Negative for chest pain.     OBJECTIVE:    Physical Exam Cardiovascular:     Rate and Rhythm: Rhythm irregular.     Heart sounds: Murmur heard.    Musculoskeletal:     Left lower leg: Edema present.  Skin:    Findings: Lesion present.     Comments: Area size of tennis ball open and weeping with some purulent drainage.   Neurological:     Mental Status: He is alert.  Psychiatric:        Mood and Affect: Mood normal.     BP 126/86   Pulse 73   Ht 6\' 1"  (1.854 m)   Wt 239 lb 4.8 oz (108.5 kg)   BMI 31.57 kg/m  Wt Readings from Last 3 Encounters:   04/05/21 239 lb 4.8 oz (108.5 kg)  12/05/20 243 lb 4.8 oz (110.4 kg)  08/14/20 237 lb 11.2 oz (107.8 kg)    Health Maintenance Due  Topic Date Due  . COVID-19 Vaccine (1) Never done  . TETANUS/TDAP  Never done  . Zoster Vaccines- Shingrix (1 of 2) Never done  . PNA vac Low Risk Adult (1 of 2 - PCV13) Never done    There are no preventive care reminders to display for this patient.  CBC Latest Ref Rng & Units 02/04/2020 02/03/2020 12/26/2019  WBC 4.0 - 10.5 K/uL 6.7 4.6 4.2  Hemoglobin 13.0 - 17.0 g/dL 11.7(L) 12.7(L) 12.7(L)  Hematocrit 39.0 - 52.0 % 34.6(L) 37.4(L) 37.8(L)  Platelets 150 - 400 K/uL 158 180 200   CMP Latest Ref Rng & Units 02/04/2020 02/03/2020 12/26/2019  Glucose 70 - 99 mg/dL 12/28/2019) - 086(V)  BUN 8 - 23 mg/dL 22 - 18  Creatinine 784(O - 1.24 mg/dL 9.62) 9.52(W) 4.13(K)  Sodium 135 - 145 mmol/L 135 - 136  Potassium 3.5 - 5.1 mmol/L 3.8 - 4.0  Chloride 98 - 111 mmol/L 104 - 102  CO2 22 - 32 mmol/L 22 - 26  Calcium 8.9 - 10.3 mg/dL 8.3(L) - 8.7(L)  Total Protein 6.5 - 8.1 g/dL - - -  Total Bilirubin 0.3 - 1.2 mg/dL - - -  Alkaline Phos 38 - 126 U/L - - -  AST 15 - 41 U/L - - -  ALT 0 - 44 U/L - - -    No results found for: TSH Lab Results  Component Value Date   ALBUMIN 2.6 (L) 05/09/2019   ANIONGAP 9 02/04/2020   No results found for: CHOL, HDL, LDLCALC, CHOLHDL Lab Results  Component Value Date   TRIG 106 05/09/2019   No results found for: HGBA1C    ASSESSMENT & PLAN:   Problem List Items Addressed This Visit      Cardiovascular and Mediastinum   Varicose veins of leg with swelling, left - Primary    Patient with significant swelling left leg with open area left lower leg with mild erythema. Some purulent drainage. He has been using Eucerin cream on the area.          Other   Cellulitis and abscess of leg    See Vericose vein note, treating with oral abx x 7 days. Will fu in 2 weeks.          Meds ordered this encounter  Medications   . cephALEXin (KEFLEX) 500 MG capsule    Sig: Take 1 capsule (500 mg  total) by mouth 4 (four) times daily.    Dispense:  28 capsule    Refill:  0     Follow-up: No follow-ups on file.    Irish Lack, FNP [facility] [address], [place], [place] [address]

## 2021-04-21 ENCOUNTER — Other Ambulatory Visit: Payer: Self-pay | Admitting: Internal Medicine

## 2021-04-22 ENCOUNTER — Other Ambulatory Visit: Payer: Self-pay

## 2021-04-22 ENCOUNTER — Encounter: Payer: Self-pay | Admitting: Internal Medicine

## 2021-04-22 ENCOUNTER — Ambulatory Visit (INDEPENDENT_AMBULATORY_CARE_PROVIDER_SITE_OTHER): Payer: Medicare Other | Admitting: Internal Medicine

## 2021-04-22 VITALS — BP 135/79 | HR 82 | Ht 73.0 in | Wt 242.4 lb

## 2021-04-22 DIAGNOSIS — L02419 Cutaneous abscess of limb, unspecified: Secondary | ICD-10-CM

## 2021-04-22 DIAGNOSIS — I10 Essential (primary) hypertension: Secondary | ICD-10-CM

## 2021-04-22 DIAGNOSIS — E669 Obesity, unspecified: Secondary | ICD-10-CM | POA: Diagnosis not present

## 2021-04-22 DIAGNOSIS — I83892 Varicose veins of left lower extremities with other complications: Secondary | ICD-10-CM

## 2021-04-22 DIAGNOSIS — L03119 Cellulitis of unspecified part of limb: Secondary | ICD-10-CM

## 2021-04-22 MED ORDER — ELIQUIS 5 MG PO TABS: 1.0000 | ORAL_TABLET | Freq: Two times a day (BID) | ORAL | 6 refills | Status: DC

## 2021-04-22 MED ORDER — TRIAMCINOLONE ACETONIDE 0.1 % EX CREA: 1.0000 "application " | TOPICAL_CREAM | Freq: Two times a day (BID) | CUTANEOUS | 0 refills | Status: DC

## 2021-04-22 NOTE — Assessment & Plan Note (Signed)

## 2021-04-22 NOTE — Progress Notes (Signed)
Established Patient Office Visit  Subjective:  Patient ID: Mark Blevins, male    DOB: [birth year]  Age: 80 y.o. MRN: [id number]  CC:  Chief Complaint  Patient presents with   Follow-up    HPI  Mark Blevins presents for dermatitis of the left leg, he has been treated with Keflex and showed improvement in the cellulitis.  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  Left leg is swollen and he is taking Eliquis for that.  He was advised to continue taking Eliquis.  He does not smoke does not drink.  Denies any history of chest pain.  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  Prostate symptomare under control. Past Medical History:  Diagnosis Date   BPH (benign prostatic hyperplasia)    Hypertension    Venous reflux     Past Surgical History:  Procedure Laterality Date   APPENDECTOMY     BACK SURGERY     COLECTOMY N/A 04/27/2019   Procedure: COLECTOMY WITH COLOSTOMY;  Surgeon: Sung Amabile, DO;  Location: [facility];  Service: General;  Laterality: N/A;   COLONOSCOPY WITH PROPOFOL N/A 04/24/2019   Procedure: COLONOSCOPY WITH PROPOFOL;  Surgeon: Sung Amabile, DO;  Location: [facility];  Service: General;  Laterality: N/A;   LAPAROSCOPIC LYSIS OF ADHESIONS  05/06/2019   Procedure: LAPAROSCOPIC LYSIS OF ADHESIONS;  Surgeon: Sung Amabile, DO;  Location: [facility];  Service: General;;   LAPAROSCOPY N/A 05/06/2019   Procedure: LAPAROSCOPY DIAGNOSTIC;  Surgeon: Sung Amabile, DO;  Location: [facility];  Service: General;  Laterality: N/A;   REPLACEMENT TOTAL KNEE BILATERAL     XI ROBOTIC ASSISTED COLOSTOMY TAKEDOWN N/A 02/03/2020   Procedure: XI ROBOTIC ASSISTED COLOSTOMY TAKEDOWN;  Surgeon: Sung Amabile, DO;  Location: [facility];  Service: General;  Laterality: N/A;    History reviewed. No pertinent family history.  Social History   Socioeconomic History   Marital status: Married    Spouse name: Not on file   Number of children: Not on  file   Years of education: Not on file   Highest education level: Not on file  Occupational History   Not on file  Tobacco Use   Smoking status: Former    Pack years: 0.00   Smokeless tobacco: Current    Types: Chew   Tobacco comments:    quit smoking 1969 or 1970  Vaping Use   Vaping Use: Never used  Substance and Sexual Activity   Alcohol use: Yes    Comment: socially. 1beer yesterday   Drug use: Never   Sexual activity: Not on file  Other Topics Concern   Not on file  Social History Narrative   Not on file   Social Determinants of Health   Financial Resource Strain: Not on file  Food Insecurity: Not on file  Transportation Needs: Not on file  Physical Activity: Not on file  Stress: Not on file  Social Connections: Not on file  Intimate Partner Violence: Not on file     Current Outpatient Medications:    amLODipine (NORVASC) 10 MG tablet, TAKE 1 TABLET BY MOUTH DAILY, Disp: 90 tablet, Rfl: 3   cephALEXin (KEFLEX) 500 MG capsule, Take 1 capsule (500 mg total) by mouth 4 (four) times daily., Disp: 28 capsule, Rfl: 0   cloNIDine (CATAPRES) 0.1 MG tablet, TAKE 1 TABLET BY MOUTH DAILY, Disp: 90 tablet, Rfl: 2   labetalol (NORMODYNE) 200 MG tablet, TAKE 1  TABLET BY MOUTH DAILY, Disp: 90 tablet, Rfl: 3   Multiple Vitamin (MULTIVITAMIN WITH MINERALS) TABS tablet, Take 1 tablet by mouth daily., Disp: , Rfl:    triamcinolone cream (KENALOG) 0.1 %, Apply 1 application topically 2 (two) times daily., Disp: 30 g, Rfl: 0   apixaban (ELIQUIS) 5 MG TABS tablet, Take 1 tablet (5 mg total) by mouth 2 (two) times daily., Disp: 60 tablet, Rfl: 6   Allergies  Allergen Reactions   Codeine Nausea And Vomiting   Morphine And Related Nausea And Vomiting    ROS Review of Systems  Constitutional: Negative.   HENT: Negative.    Eyes: Negative.   Respiratory: Negative.    Cardiovascular: Negative.   Gastrointestinal: Negative.   Endocrine: Negative.   Genitourinary: Negative.    Musculoskeletal:        Left leg is swollen there is also dermatitis of the left lower leg.  He has varicose veins of the both legs.  Also has bilateral knee surgery.  Skin: Negative.   Allergic/Immunologic: Negative.   Neurological: Negative.   Hematological: Negative.   Psychiatric/Behavioral: Negative.    All other systems reviewed and are negative.    Objective:    Physical Exam Vitals reviewed.  Constitutional:      Appearance: Normal appearance.  HENT:     Mouth/Throat:     Mouth: Mucous membranes are moist.  Eyes:     Pupils: Pupils are equal, round, and reactive to light.  Neck:     Vascular: No carotid bruit.  Cardiovascular:     Rate and Rhythm: Normal rate and regular rhythm.     Pulses: Normal pulses.     Heart sounds: Normal heart sounds.  Pulmonary:     Effort: Pulmonary effort is normal.     Breath sounds: Normal breath sounds.  Abdominal:     General: Bowel sounds are normal.     Palpations: Abdomen is soft. There is no hepatomegaly, splenomegaly or mass.     Tenderness: There is no abdominal tenderness.     Hernia: No hernia is present.  Musculoskeletal:     Cervical back: Neck supple.     Right lower leg: No edema.     Left lower leg: No edema.  Skin:    Findings: No rash.     Comments: Dermatitis of the left leg.  Neurological:     Mental Status: He is alert and oriented to person, place, and time.     Motor: No weakness.  Psychiatric:        Mood and Affect: Mood normal.        Behavior: Behavior normal.    BP 135/79   Pulse 82   Ht 6\' 1"  (1.854 m)   Wt 242 lb 6.4 oz (110 kg)   BMI 31.98 kg/m  Wt Readings from Last 3 Encounters:  04/22/21 242 lb 6.4 oz (110 kg)  04/05/21 239 lb 4.8 oz (108.5 kg)  12/05/20 243 lb 4.8 oz (110.4 kg)     Health Maintenance Due  Topic Date Due   COVID-19 Vaccine (1) Never done   TETANUS/TDAP  Never done   Zoster Vaccines- Shingrix (1 of 2) Never done   PNA vac Low Risk Adult (1 of 2 - PCV13) Never  done    There are no preventive care reminders to display for this patient.  No results found for: TSH Lab Results  Component Value Date   WBC 6.7 02/04/2020   HGB 11.7 (L) 02/04/2020  HCT 34.6 (L) 02/04/2020   MCV 100.3 (H) 02/04/2020   PLT 158 02/04/2020   Lab Results  Component Value Date   NA 135 02/04/2020   K 3.8 02/04/2020   CO2 22 02/04/2020   GLUCOSE 106 (H) 02/04/2020   BUN 22 02/04/2020   CREATININE 1.37 (H) 02/04/2020   BILITOT 0.8 05/09/2019   ALKPHOS 70 05/09/2019   AST 31 05/09/2019   ALT 41 05/09/2019   PROT 5.3 (L) 05/09/2019   ALBUMIN 2.6 (L) 05/09/2019   CALCIUM 8.3 (L) 02/04/2020   ANIONGAP 9 02/04/2020   No results found for: CHOL No results found for: HDL No results found for: [facility] Lab Results  Component Value Date   TRIG 106 05/09/2019   No results found for: CHOLHDL No results found for: LYYT0P    Assessment & Plan:   Problem List Items Addressed This Visit       Cardiovascular and Mediastinum   Hypertension    Patient blood pressure is normal patient denies any chest pain or shortness of breath there is no history of palpitation or paroxysmal nocturnal dyspnea   patient was advised to follow low-salt low-cholesterol diet    ideally I want to keep systolic blood pressure below 546 mmHg, patient was asked to check blood pressure one times a week and give me a report on that.  Patient will be follow-up in 3 months  or earlier as needed, patient will call me back for any change in the cardiovascular symptoms Patient was advised to buy a book from local bookstore concerning blood pressure and read several chapters  every day.  This will be supplemented by some of the material we will give him from the office.  Patient should also utilize other resources like YouTube and Internet to learn more about the blood pressure and the diet.       Relevant Medications   apixaban (ELIQUIS) 5 MG TABS tablet   Varicose veins of leg with swelling,  left - Primary    Is a chronic problem patient has cellulitis of the left leg recently so treated with Keflex.  Cellulitis is much better.  I also referred him to a vein specialist.  He was advised to continue taking Eliquis.       Relevant Medications   apixaban (ELIQUIS) 5 MG TABS tablet   triamcinolone cream (KENALOG) 0.1 %     Other   Obesity (BMI 30-39.9)   Cellulitis and abscess of leg    Cellulitis of the left leg is better.  Patient has dermatitis of the left lower leg.  He was advised to continue Eliquis.  Cortisone cream was prescribed to apply on the left leg.  He was also advised to keep left leg elevated he does not want to wrap it.  I referred him to a dermatologist.        Meds ordered this encounter  Medications   apixaban (ELIQUIS) 5 MG TABS tablet    Sig: Take 1 tablet (5 mg total) by mouth 2 (two) times daily.    Dispense:  60 tablet    Refill:  6   triamcinolone cream (KENALOG) 0.1 %    Sig: Apply 1 application topically 2 (two) times daily.    Dispense:  30 g    Refill:  0    Follow-up: No follow-ups on file.    Corky Downs, MD

## 2021-04-22 NOTE — Assessment & Plan Note (Signed)
Cellulitis of the left leg is better.  Patient has dermatitis of the left lower leg.  He was advised to continue Eliquis.  Cortisone cream was prescribed to apply on the left leg.  He was also advised to keep left leg elevated he does not want to wrap it.  I referred him to a dermatologist.

## 2021-04-22 NOTE — Assessment & Plan Note (Signed)
Is a chronic problem patient has cellulitis of the left leg recently so treated with Keflex.  Cellulitis is much better.  I also referred him to a vein specialist.  He was advised to continue taking Eliquis.

## 2021-05-01 DIAGNOSIS — I83892 Varicose veins of left lower extremities with other complications: Secondary | ICD-10-CM | POA: Diagnosis not present

## 2021-05-01 DIAGNOSIS — I4819 Other persistent atrial fibrillation: Secondary | ICD-10-CM | POA: Diagnosis not present

## 2021-05-01 DIAGNOSIS — I10 Essential (primary) hypertension: Secondary | ICD-10-CM | POA: Diagnosis not present

## 2021-05-01 DIAGNOSIS — I350 Nonrheumatic aortic (valve) stenosis: Secondary | ICD-10-CM | POA: Diagnosis not present

## 2021-05-06 ENCOUNTER — Encounter: Payer: Self-pay | Admitting: Internal Medicine

## 2021-05-06 ENCOUNTER — Other Ambulatory Visit: Payer: Self-pay

## 2021-05-06 ENCOUNTER — Ambulatory Visit (INDEPENDENT_AMBULATORY_CARE_PROVIDER_SITE_OTHER): Payer: Medicare Other | Admitting: Internal Medicine

## 2021-05-06 VITALS — BP 120/78 | HR 79 | Ht 73.0 in | Wt 240.2 lb

## 2021-05-06 DIAGNOSIS — I83892 Varicose veins of left lower extremities with other complications: Secondary | ICD-10-CM | POA: Diagnosis not present

## 2021-05-06 DIAGNOSIS — E669 Obesity, unspecified: Secondary | ICD-10-CM

## 2021-05-06 DIAGNOSIS — M7989 Other specified soft tissue disorders: Secondary | ICD-10-CM | POA: Diagnosis not present

## 2021-05-06 DIAGNOSIS — I10 Essential (primary) hypertension: Secondary | ICD-10-CM | POA: Diagnosis not present

## 2021-05-06 MED ORDER — AMLODIPINE BESYLATE 5 MG PO TABS: 5.0000 mg | ORAL_TABLET | Freq: Every day | ORAL | 3 refills | Status: DC

## 2021-05-06 NOTE — Assessment & Plan Note (Signed)
Swelling of the left leg is down.

## 2021-05-06 NOTE — Assessment & Plan Note (Signed)

## 2021-05-06 NOTE — Assessment & Plan Note (Signed)

## 2021-05-06 NOTE — Progress Notes (Signed)
Established Patient Office Visit  Subjective:  Patient ID: Mark Blevins, male    DOB: Apr 16, 1941  Age: 80 y.o. MRN: [id number]  CC:  Chief Complaint  Patient presents with   Follow-up    Patient is here for a 2 week follow up from leg swelling and poor circulation in left leg.    HPI  Mark Blevins presents for check up on the swelling of the left leg.  Left leg is 1+ There is evidence of stasis dermatitis patient has seen a vascular doctor.  He is already getting better slowly, he does not smoke does not drink.  Past Medical History:  Diagnosis Date   BPH (benign prostatic hyperplasia)    Hypertension    Venous reflux     Past Surgical History:  Procedure Laterality Date   APPENDECTOMY     BACK SURGERY     COLECTOMY N/A 04/27/2019   Procedure: COLECTOMY WITH COLOSTOMY;  Surgeon: Sung Amabile, DO;  Location: [facility];  Service: General;  Laterality: N/A;   COLONOSCOPY WITH PROPOFOL N/A 04/24/2019   Procedure: COLONOSCOPY WITH PROPOFOL;  Surgeon: Sung Amabile, DO;  Location: [facility];  Service: General;  Laterality: N/A;   LAPAROSCOPIC LYSIS OF ADHESIONS  05/06/2019   Procedure: LAPAROSCOPIC LYSIS OF ADHESIONS;  Surgeon: Sung Amabile, DO;  Location: [facility];  Service: General;;   LAPAROSCOPY N/A 05/06/2019   Procedure: LAPAROSCOPY DIAGNOSTIC;  Surgeon: Sung Amabile, DO;  Location: [facility];  Service: General;  Laterality: N/A;   REPLACEMENT TOTAL KNEE BILATERAL     XI ROBOTIC ASSISTED COLOSTOMY TAKEDOWN N/A 02/03/2020   Procedure: XI ROBOTIC ASSISTED COLOSTOMY TAKEDOWN;  Surgeon: Sung Amabile, DO;  Location: [facility];  Service: General;  Laterality: N/A;    History reviewed. No pertinent family history.  Social History   Socioeconomic History   Marital status: Married    Spouse name: Not on file   Number of children: Not on file   Years of education: Not on file   Highest education level: Not on file  Occupational History   Not on file  Tobacco Use   Smoking  status: Former    Pack years: 0.00   Smokeless tobacco: Current    Types: Chew   Tobacco comments:    quit smoking 1969 or 1970  Vaping Use   Vaping Use: Never used  Substance and Sexual Activity   Alcohol use: Yes    Comment: socially. 1beer yesterday   Drug use: Never   Sexual activity: Not on file  Other Topics Concern   Not on file  Social History Narrative   Not on file   Social Determinants of Health   Financial Resource Strain: Not on file  Food Insecurity: Not on file  Transportation Needs: Not on file  Physical Activity: Not on file  Stress: Not on file  Social Connections: Not on file  Intimate Partner Violence: Not on file     Current Outpatient Medications:    amLODipine (NORVASC) 5 MG tablet, Take 1 tablet (5 mg total) by mouth daily., Disp: 90 tablet, Rfl: 3   apixaban (ELIQUIS) 5 MG TABS tablet, Take 1 tablet (5 mg total) by mouth 2 (two) times daily., Disp: 60 tablet, Rfl: 6   cloNIDine (CATAPRES) 0.1 MG tablet, TAKE 1 TABLET BY MOUTH DAILY, Disp: 90 tablet, Rfl: 2   labetalol (NORMODYNE) 200 MG tablet, TAKE 1 TABLET BY MOUTH DAILY, Disp: 90 tablet, Rfl: 3   Multiple Vitamin (MULTIVITAMIN WITH MINERALS) TABS  tablet, Take 1 tablet by mouth daily., Disp: , Rfl:    triamcinolone cream (KENALOG) 0.1 %, Apply 1 application topically 2 (two) times daily., Disp: 30 g, Rfl: 0   Allergies  Allergen Reactions   Codeine Nausea And Vomiting   Morphine And Related Nausea And Vomiting    ROS Review of Systems  Constitutional: Negative.   HENT: Negative.    Eyes: Negative.   Respiratory: Negative.    Cardiovascular: Negative.   Gastrointestinal: Negative.   Endocrine: Negative.   Genitourinary: Negative.   Allergic/Immunologic: Negative.   Neurological: Negative.   Psychiatric/Behavioral: Negative.    All other systems reviewed and are negative.    Objective:    Physical Exam Vitals reviewed.  Constitutional:      Appearance: Normal appearance.   HENT:     Mouth/Throat:     Mouth: Mucous membranes are moist.  Eyes:     Pupils: Pupils are equal, round, and reactive to light.  Neck:     Vascular: No carotid bruit.  Cardiovascular:     Rate and Rhythm: Normal rate and regular rhythm.     Pulses: Normal pulses.     Heart sounds: Normal heart sounds.  Pulmonary:     Effort: Pulmonary effort is normal.     Breath sounds: Normal breath sounds.  Abdominal:     General: Bowel sounds are normal.     Palpations: Abdomen is soft. There is no hepatomegaly, splenomegaly or mass.     Tenderness: There is no abdominal tenderness.     Hernia: No hernia is present.  Musculoskeletal:        General: Swelling present. No tenderness or signs of injury.     Cervical back: Neck supple.     Right lower leg: No edema.     Left lower leg: Edema present.  Skin:    Findings: No rash.  Neurological:     Mental Status: He is alert and oriented to person, place, and time.     Motor: No weakness.  Psychiatric:        Mood and Affect: Mood normal.        Behavior: Behavior normal.    BP 120/78   Pulse 79   Ht 6\' 1"  (1.854 m)   Wt 240 lb 3.2 oz (109 kg)   BMI 31.69 kg/m  Wt Readings from Last 3 Encounters:  05/06/21 240 lb 3.2 oz (109 kg)  04/22/21 242 lb 6.4 oz (110 kg)  04/05/21 239 lb 4.8 oz (108.5 kg)     Health Maintenance Due  Topic Date Due   COVID-19 Vaccine (1) Never done   TETANUS/TDAP  Never done   Zoster Vaccines- Shingrix (1 of 2) Never done   PNA vac Low Risk Adult (1 of 2 - PCV13) Never done    There are no preventive care reminders to display for this patient.  No results found for: TSH Lab Results  Component Value Date   WBC 6.7 02/04/2020   HGB 11.7 (L) 02/04/2020   HCT 34.6 (L) 02/04/2020   MCV 100.3 (H) 02/04/2020   PLT 158 02/04/2020   Lab Results  Component Value Date   NA 135 02/04/2020   K 3.8 02/04/2020   CO2 22 02/04/2020   GLUCOSE 106 (H) 02/04/2020   BUN 22 02/04/2020   CREATININE 1.37 (H)  02/04/2020   BILITOT 0.8 05/09/2019   ALKPHOS 70 05/09/2019   AST 31 05/09/2019   ALT 41 05/09/2019   PROT 5.3 (  L) 05/09/2019   ALBUMIN 2.6 (L) 05/09/2019   CALCIUM 8.3 (L) 02/04/2020   ANIONGAP 9 02/04/2020   No results found for: CHOL No results found for: HDL No results found for: [facility] Lab Results  Component Value Date   TRIG 106 05/09/2019   No results found for: CHOLHDL No results found for: [id number]    Assessment & Plan:   Problem List Items Addressed This Visit       Cardiovascular and Mediastinum   Hypertension - Primary     Patient denies any chest pain or shortness of breath there is no history of palpitation or paroxysmal nocturnal dyspnea   patient was advised to follow low-salt low-cholesterol diet    ideally I want to keep systolic blood pressure below 045 mmHg, patient was asked to check blood pressure one times a week and give me a report on that.  Patient will be follow-up in 3 months  or earlier as needed, patient will call me back for any change in the cardiovascular symptoms Patient was advised to buy a book from local bookstore concerning blood pressure and read several chapters  every day.  This will be supplemented by some of the material we will give him from the office.  Patient should also utilize other resources like YouTube and Internet to learn more about the blood pressure and the diet.       Relevant Medications   amLODipine (NORVASC) 5 MG tablet   Varicose veins of leg with swelling, left    Refer to the vascular Patient already been seen, by vascular specialist       Relevant Medications   amLODipine (NORVASC) 5 MG tablet     Other   Swelling of limb    Swelling of the left leg is down.       Obesity (BMI 30-39.9)    - I encouraged the patient to lose weight.  - I educated them on making healthy dietary choices including eating more fruits and vegetables and less fried foods. - I encouraged the patient to exercise more, and  educated on the benefits of exercise including weight loss, diabetes prevention, and hypertension prevention.   Dietary counseling with a registered dietician  Referral to a weight management support group (e.g. Weight Watchers, Overeaters Anonymous)  If your BMI is greater than 29 or you have gained more than 15 pounds you should work on weight loss.  Attend a healthy cooking class         Meds ordered this encounter  Medications   amLODipine (NORVASC) 5 MG tablet    Sig: Take 1 tablet (5 mg total) by mouth daily.    Dispense:  90 tablet    Refill:  3    Follow-up: No follow-ups on file.    Corky Downs, MD

## 2021-05-06 NOTE — Assessment & Plan Note (Signed)
Refer to the vascular Patient already been seen, by vascular specialist

## 2021-06-06 DIAGNOSIS — I350 Nonrheumatic aortic (valve) stenosis: Secondary | ICD-10-CM | POA: Diagnosis not present

## 2021-06-10 DIAGNOSIS — I4819 Other persistent atrial fibrillation: Secondary | ICD-10-CM | POA: Diagnosis not present

## 2021-06-10 DIAGNOSIS — I350 Nonrheumatic aortic (valve) stenosis: Secondary | ICD-10-CM | POA: Diagnosis not present

## 2021-06-10 DIAGNOSIS — I10 Essential (primary) hypertension: Secondary | ICD-10-CM | POA: Diagnosis not present

## 2021-07-08 ENCOUNTER — Other Ambulatory Visit: Payer: Self-pay

## 2021-07-08 ENCOUNTER — Encounter: Payer: Self-pay | Admitting: Internal Medicine

## 2021-07-08 ENCOUNTER — Ambulatory Visit (INDEPENDENT_AMBULATORY_CARE_PROVIDER_SITE_OTHER): Payer: Medicare Other | Admitting: Internal Medicine

## 2021-07-08 VITALS — BP 122/69 | HR 72 | Ht 73.0 in | Wt 239.0 lb

## 2021-07-08 DIAGNOSIS — E669 Obesity, unspecified: Secondary | ICD-10-CM | POA: Diagnosis not present

## 2021-07-08 DIAGNOSIS — I10 Essential (primary) hypertension: Secondary | ICD-10-CM

## 2021-07-08 DIAGNOSIS — L02419 Cutaneous abscess of limb, unspecified: Secondary | ICD-10-CM

## 2021-07-08 DIAGNOSIS — I83892 Varicose veins of left lower extremities with other complications: Secondary | ICD-10-CM

## 2021-07-08 DIAGNOSIS — L03119 Cellulitis of unspecified part of limb: Secondary | ICD-10-CM | POA: Diagnosis not present

## 2021-07-08 NOTE — Progress Notes (Signed)
Established Patient Office Visit  Subjective:  Patient ID: Mark Blevins, male    DOB: [birth year]  Age: 80 y.o. MRN: [id number]  CC:  Chief Complaint  Patient presents with   Hypertension    Hypertension This is a recurrent problem. The current episode started more than 1 year ago. The problem has been gradually improving since onset. The problem is controlled. Pertinent negatives include no anxiety, blurred vision, chest pain, headaches, malaise/fatigue, neck pain, orthopnea, palpitations, peripheral edema, PND, shortness of breath or sweats. Risk factors for coronary artery disease include dyslipidemia and obesity.   Mark Blevins presents for check up  Past Medical History:  Diagnosis Date   BPH (benign prostatic hyperplasia)    Hypertension    Venous reflux     Past Surgical History:  Procedure Laterality Date   APPENDECTOMY     BACK SURGERY     COLECTOMY N/A 04/27/2019   Procedure: COLECTOMY WITH COLOSTOMY;  Surgeon: Sung Amabile, DO;  Location: [facility];  Service: General;  Laterality: N/A;   COLONOSCOPY WITH PROPOFOL N/A 04/24/2019   Procedure: COLONOSCOPY WITH PROPOFOL;  Surgeon: Sung Amabile, DO;  Location: [facility];  Service: General;  Laterality: N/A;   LAPAROSCOPIC LYSIS OF ADHESIONS  05/06/2019   Procedure: LAPAROSCOPIC LYSIS OF ADHESIONS;  Surgeon: Sung Amabile, DO;  Location: [facility];  Service: General;;   LAPAROSCOPY N/A 05/06/2019   Procedure: LAPAROSCOPY DIAGNOSTIC;  Surgeon: Sung Amabile, DO;  Location: [facility];  Service: General;  Laterality: N/A;   REPLACEMENT TOTAL KNEE BILATERAL     XI ROBOTIC ASSISTED COLOSTOMY TAKEDOWN N/A 02/03/2020   Procedure: XI ROBOTIC ASSISTED COLOSTOMY TAKEDOWN;  Surgeon: Sung Amabile, DO;  Location: [facility];  Service: General;  Laterality: N/A;    History reviewed. No pertinent family history.  Social History   Socioeconomic History   Marital status: Married    Spouse name: Not on file   Number of children: Not on  file   Years of education: Not on file   Highest education level: Not on file  Occupational History   Not on file  Tobacco Use   Smoking status: Former   Smokeless tobacco: Current    Types: Chew   Tobacco comments:    quit smoking 1969 or 1970  Vaping Use   Vaping Use: Never used  Substance and Sexual Activity   Alcohol use: Yes    Comment: socially. 1beer yesterday   Drug use: Never   Sexual activity: Not on file  Other Topics Concern   Not on file  Social History Narrative   Not on file   Social Determinants of Health   Financial Resource Strain: Not on file  Food Insecurity: Not on file  Transportation Needs: Not on file  Physical Activity: Not on file  Stress: Not on file  Social Connections: Not on file  Intimate Partner Violence: Not on file     Current Outpatient Medications:    amLODipine (NORVASC) 5 MG tablet, Take 1 tablet (5 mg total) by mouth daily., Disp: 90 tablet, Rfl: 3   apixaban (ELIQUIS) 5 MG TABS tablet, Take 1 tablet (5 mg total) by mouth 2 (two) times daily., Disp: 60 tablet, Rfl: 6   cloNIDine (CATAPRES) 0.1 MG tablet, TAKE 1 TABLET BY MOUTH DAILY, Disp: 90 tablet, Rfl: 2   labetalol (NORMODYNE) 200 MG tablet, TAKE 1 TABLET BY MOUTH DAILY, Disp: 90 tablet, Rfl: 3   Multiple Vitamin (MULTIVITAMIN WITH MINERALS) TABS tablet, Take 1 tablet by  mouth daily., Disp: , Rfl:    triamcinolone cream (KENALOG) 0.1 %, Apply 1 application topically 2 (two) times daily., Disp: 30 g, Rfl: 0   Allergies  Allergen Reactions   Codeine Nausea And Vomiting   Morphine And Related Nausea And Vomiting    ROS Review of Systems  Constitutional: Negative.  Negative for malaise/fatigue.  HENT: Negative.    Eyes: Negative.  Negative for blurred vision.  Respiratory: Negative.  Negative for shortness of breath.   Cardiovascular: Negative.  Negative for chest pain, palpitations, orthopnea and PND.  Gastrointestinal: Negative.   Endocrine: Negative.   Genitourinary:  Negative.   Musculoskeletal: Negative.  Negative for neck pain.  Skin: Negative.   Allergic/Immunologic: Negative.   Neurological: Negative.  Negative for headaches.  Hematological: Negative.   Psychiatric/Behavioral: Negative.    All other systems reviewed and are negative.    Objective:    Physical Exam Vitals reviewed.  Constitutional:      Appearance: Normal appearance.  HENT:     Mouth/Throat:     Mouth: Mucous membranes are moist.  Eyes:     Pupils: Pupils are equal, round, and reactive to light.  Neck:     Vascular: No carotid bruit.  Cardiovascular:     Rate and Rhythm: Normal rate and regular rhythm.     Pulses: Normal pulses.     Heart sounds: Normal heart sounds.  Pulmonary:     Effort: Pulmonary effort is normal.     Breath sounds: Normal breath sounds.  Abdominal:     General: Bowel sounds are normal.     Palpations: Abdomen is soft. There is no hepatomegaly, splenomegaly or mass.     Tenderness: There is no abdominal tenderness.     Hernia: No hernia is present.  Musculoskeletal:     Cervical back: Neck supple.     Right lower leg: No edema.     Left lower leg: No edema.     Comments: Varicose veins  lt leg  Skin:    Findings: No rash.  Neurological:     Mental Status: He is alert and oriented to person, place, and time.     Motor: No weakness.  Psychiatric:        Mood and Affect: Mood normal.        Behavior: Behavior normal.    BP 122/69   Pulse 72   Ht 6\' 1"  (1.854 m)   Wt 239 lb (108.4 kg)   BMI 31.53 kg/m  Wt Readings from Last 3 Encounters:  07/08/21 239 lb (108.4 kg)  05/06/21 240 lb 3.2 oz (109 kg)  04/22/21 242 lb 6.4 oz (110 kg)     Health Maintenance Due  Topic Date Due   COVID-19 Vaccine (1) Never done   TETANUS/TDAP  Never done   Zoster Vaccines- Shingrix (1 of 2) Never done   PNA vac Low Risk Adult (1 of 2 - PCV13) Never done   INFLUENZA VACCINE  06/10/2021    There are no preventive care reminders to display for  this patient.  No results found for: TSH Lab Results  Component Value Date   WBC 6.7 02/04/2020   HGB 11.7 (L) 02/04/2020   HCT 34.6 (L) 02/04/2020   MCV 100.3 (H) 02/04/2020   PLT 158 02/04/2020   Lab Results  Component Value Date   NA 135 02/04/2020   K 3.8 02/04/2020   CO2 22 02/04/2020   GLUCOSE 106 (H) 02/04/2020   BUN 22 02/04/2020  CREATININE 1.37 (H) 02/04/2020   BILITOT 0.8 05/09/2019   ALKPHOS 70 05/09/2019   AST 31 05/09/2019   ALT 41 05/09/2019   PROT 5.3 (L) 05/09/2019   ALBUMIN 2.6 (L) 05/09/2019   CALCIUM 8.3 (L) 02/04/2020   ANIONGAP 9 02/04/2020   No results found for: CHOL No results found for: HDL No results found for: [facility] Lab Results  Component Value Date   TRIG 106 05/09/2019   No results found for: CHOLHDL No results found for: HQPR9F    Assessment & Plan:   Problem List Items Addressed This Visit       Cardiovascular and Mediastinum   Hypertension     Patient denies any chest pain or shortness of breath there is no history of palpitation or paroxysmal nocturnal dyspnea   patient was advised to follow low-salt low-cholesterol diet    ideally I want to keep systolic blood pressure below 638 mmHg, patient was asked to check blood pressure one times a week and give me a report on that.  Patient will be follow-up in 3 months  or earlier as needed, patient will call me back for any change in the cardiovascular symptoms Patient was advised to buy a book from local bookstore concerning blood pressure and read several chapters  every day.  This will be supplemented by some of the material we will give him from the office.  Patient should also utilize other resources like YouTube and Internet to learn more about the blood pressure and the diet.      Varicose veins of leg with swelling, left - Primary     Other   Obesity (BMI 30-39.9)    - I encouraged the patient to lose weight.  - I educated them on making healthy dietary choices including  eating more fruits and vegetables and less fried foods. - I encouraged the patient to exercise more, and educated on the benefits of exercise including weight loss, diabetes prevention, and hypertension prevention.   Dietary counseling with a registered dietician  Referral to a weight management support group (e.g. Weight Watchers, Overeaters Anonymous)  If your BMI is greater than 29 or you have gained more than 15 pounds you should work on weight loss.  Attend a healthy cooking class       Cellulitis and abscess of leg    Resolved       No orders of the defined types were placed in this encounter.   Follow-up: No follow-ups on file.    Corky Downs, MD

## 2021-07-08 NOTE — Assessment & Plan Note (Signed)

## 2021-07-08 NOTE — Assessment & Plan Note (Signed)
Resolved

## 2021-07-08 NOTE — Assessment & Plan Note (Signed)

## 2021-07-08 NOTE — Assessment & Plan Note (Signed)
Dermatitis of the left leg is better

## 2021-08-09 ENCOUNTER — Other Ambulatory Visit: Payer: Self-pay | Admitting: Internal Medicine

## 2021-08-19 ENCOUNTER — Encounter: Payer: Self-pay | Admitting: Internal Medicine

## 2021-08-19 ENCOUNTER — Other Ambulatory Visit: Payer: Self-pay

## 2021-08-19 ENCOUNTER — Ambulatory Visit (INDEPENDENT_AMBULATORY_CARE_PROVIDER_SITE_OTHER): Payer: Medicare Other | Admitting: Internal Medicine

## 2021-08-19 VITALS — BP 160/84 | HR 65 | Ht 73.0 in | Wt 234.6 lb

## 2021-08-19 DIAGNOSIS — E669 Obesity, unspecified: Secondary | ICD-10-CM | POA: Diagnosis not present

## 2021-08-19 DIAGNOSIS — K625 Hemorrhage of anus and rectum: Secondary | ICD-10-CM | POA: Diagnosis not present

## 2021-08-19 DIAGNOSIS — K5733 Diverticulitis of large intestine without perforation or abscess with bleeding: Secondary | ICD-10-CM

## 2021-08-19 DIAGNOSIS — I10 Essential (primary) hypertension: Secondary | ICD-10-CM

## 2021-08-19 DIAGNOSIS — I83892 Varicose veins of left lower extremities with other complications: Secondary | ICD-10-CM

## 2021-08-19 DIAGNOSIS — K9409 Other complications of colostomy: Secondary | ICD-10-CM

## 2021-08-19 MED ORDER — CIPROFLOXACIN HCL 500 MG PO TABS: 500.0000 mg | ORAL_TABLET | Freq: Two times a day (BID) | ORAL | 0 refills | Status: AC

## 2021-08-19 NOTE — Assessment & Plan Note (Signed)

## 2021-08-19 NOTE — Assessment & Plan Note (Signed)
Patient has a surgery for volvulus.  Now he has a hernia on the left side, patient rectal examination is negative without any blood.  He was given Cipro 500 mg p.o. twice a day for 7 days he was advised to see the surgeon who was seeing him before.  We will do CBC and metabolic panel

## 2021-08-19 NOTE — Progress Notes (Signed)
Established Patient Office Visit  Subjective:  Patient ID: Mark Blevins, male    DOB: Mar 15, 1941  Age: 80 y.o. MRN: [id number]  CC:  Chief Complaint  Patient presents with   Rectal Bleeding    Rectal Bleeding  Associated symptoms include diarrhea. Pertinent negatives include no abdominal pain, no nausea and no rectal pain.   Mark Blevins presents for diarrhea  with blood  Past Medical History:  Diagnosis Date   BPH (benign prostatic hyperplasia)    Hypertension    Venous reflux     Past Surgical History:  Procedure Laterality Date   APPENDECTOMY     BACK SURGERY     COLECTOMY N/A 04/27/2019   Procedure: COLECTOMY WITH COLOSTOMY;  Surgeon: Sung Amabile, DO;  Location: [facility];  Service: General;  Laterality: N/A;   COLONOSCOPY WITH PROPOFOL N/A 04/24/2019   Procedure: COLONOSCOPY WITH PROPOFOL;  Surgeon: Sung Amabile, DO;  Location: [facility];  Service: General;  Laterality: N/A;   LAPAROSCOPIC LYSIS OF ADHESIONS  05/06/2019   Procedure: LAPAROSCOPIC LYSIS OF ADHESIONS;  Surgeon: Sung Amabile, DO;  Location: [facility];  Service: General;;   LAPAROSCOPY N/A 05/06/2019   Procedure: LAPAROSCOPY DIAGNOSTIC;  Surgeon: Sung Amabile, DO;  Location: [facility];  Service: General;  Laterality: N/A;   REPLACEMENT TOTAL KNEE BILATERAL     XI ROBOTIC ASSISTED COLOSTOMY TAKEDOWN N/A 02/03/2020   Procedure: XI ROBOTIC ASSISTED COLOSTOMY TAKEDOWN;  Surgeon: Sung Amabile, DO;  Location: [facility];  Service: General;  Laterality: N/A;    History reviewed. No pertinent family history.  Social History   Socioeconomic History   Marital status: Married    Spouse name: Not on file   Number of children: Not on file   Years of education: Not on file   Highest education level: Not on file  Occupational History   Not on file  Tobacco Use   Smoking status: Former   Smokeless tobacco: Current    Types: Chew   Tobacco comments:    quit smoking 1969 or 1970  Vaping Use   Vaping Use:  Never used  Substance and Sexual Activity   Alcohol use: Yes    Comment: socially. 1beer yesterday   Drug use: Never   Sexual activity: Not on file  Other Topics Concern   Not on file  Social History Narrative   Not on file   Social Determinants of Health   Financial Resource Strain: Not on file  Food Insecurity: Not on file  Transportation Needs: Not on file  Physical Activity: Not on file  Stress: Not on file  Social Connections: Not on file  Intimate Partner Violence: Not on file     Current Outpatient Medications:    amLODipine (NORVASC) 5 MG tablet, Take 1 tablet (5 mg total) by mouth daily., Disp: 90 tablet, Rfl: 3   apixaban (ELIQUIS) 5 MG TABS tablet, Take 1 tablet (5 mg total) by mouth 2 (two) times daily., Disp: 60 tablet, Rfl: 6   ciprofloxacin (CIPRO) 500 MG tablet, Take 1 tablet (500 mg total) by mouth 2 (two) times daily for 10 days., Disp: 20 tablet, Rfl: 0   cloNIDine (CATAPRES) 0.1 MG tablet, TAKE 1 TABLET BY MOUTH DAILY, Disp: 90 tablet, Rfl: 2   labetalol (NORMODYNE) 200 MG tablet, TAKE 1 TABLET BY MOUTH DAILY, Disp: 90 tablet, Rfl: 3   Multiple Vitamin (MULTIVITAMIN WITH MINERALS) TABS tablet, Take 1 tablet by mouth daily., Disp: , Rfl:    triamcinolone cream (KENALOG)  0.1 %, Apply 1 application topically 2 (two) times daily., Disp: 30 g, Rfl: 0   Allergies  Allergen Reactions   Codeine Nausea And Vomiting   Morphine And Related Nausea And Vomiting    ROS Review of Systems  Constitutional: Negative.   HENT: Negative.    Eyes: Negative.   Respiratory: Negative.    Cardiovascular: Negative.   Gastrointestinal:  Positive for abdominal distention, anal bleeding, blood in stool, diarrhea and hematochezia. Negative for abdominal pain, constipation, nausea and rectal pain.  Endocrine: Negative.   Genitourinary: Negative.   Musculoskeletal: Negative.   Skin: Negative.   Allergic/Immunologic: Negative.   Neurological: Negative.   Hematological: Negative.    Psychiatric/Behavioral: Negative.    All other systems reviewed and are negative.    Objective:    Physical Exam Vitals reviewed.  Constitutional:      Appearance: Normal appearance.  HENT:     Mouth/Throat:     Mouth: Mucous membranes are moist.  Eyes:     Pupils: Pupils are equal, round, and reactive to light.  Neck:     Vascular: No carotid bruit.  Cardiovascular:     Rate and Rhythm: Normal rate and regular rhythm.     Pulses: Normal pulses.     Heart sounds: Normal heart sounds.  Pulmonary:     Effort: Pulmonary effort is normal.     Breath sounds: Normal breath sounds.  Abdominal:     General: Bowel sounds are normal. There is distension.     Palpations: Abdomen is soft. There is no hepatomegaly, splenomegaly or mass.     Tenderness: There is no abdominal tenderness. There is no left CVA tenderness or guarding.     Hernia: A hernia is present.  Genitourinary:    Comments: Rectal exam dont reveal any blood in rectum Musculoskeletal:     Cervical back: Neck supple.     Right lower leg: No edema.     Left lower leg: No edema.  Skin:    Findings: No rash.  Neurological:     Mental Status: He is alert and oriented to person, place, and time.     Motor: No weakness.  Psychiatric:        Mood and Affect: Mood normal.        Behavior: Behavior normal.    BP (!) 160/84   Pulse 65   Ht 6\' 1"  (1.854 m)   Wt 234 lb 9.6 oz (106.4 kg)   BMI 30.95 kg/m  Wt Readings from Last 3 Encounters:  08/19/21 234 lb 9.6 oz (106.4 kg)  07/08/21 239 lb (108.4 kg)  05/06/21 240 lb 3.2 oz (109 kg)     Health Maintenance Due  Topic Date Due   COVID-19 Vaccine (1) Never done   TETANUS/TDAP  Never done   Zoster Vaccines- Shingrix (1 of 2) Never done   INFLUENZA VACCINE  Never done    There are no preventive care reminders to display for this patient.  No results found for: TSH Lab Results  Component Value Date   WBC 6.7 02/04/2020   HGB 11.7 (L) 02/04/2020   HCT 34.6  (L) 02/04/2020   MCV 100.3 (H) 02/04/2020   PLT 158 02/04/2020   Lab Results  Component Value Date   NA 135 02/04/2020   K 3.8 02/04/2020   CO2 22 02/04/2020   GLUCOSE 106 (H) 02/04/2020   BUN 22 02/04/2020   CREATININE 1.37 (H) 02/04/2020   BILITOT 0.8 05/09/2019   ALKPHOS  70 05/09/2019   AST 31 05/09/2019   ALT 41 05/09/2019   PROT 5.3 (L) 05/09/2019   ALBUMIN 2.6 (L) 05/09/2019   CALCIUM 8.3 (L) 02/04/2020   ANIONGAP 9 02/04/2020   No results found for: CHOL No results found for: HDL No results found for: [facility] Lab Results  Component Value Date   TRIG 106 05/09/2019   No results found for: CHOLHDL No results found for: SNKN3Z    Assessment & Plan:   Problem List Items Addressed This Visit       Cardiovascular and Mediastinum   Hypertension   Varicose veins of leg with swelling, left     Other   Colostomy hernia (HCC)    Patient has a surgery for volvulus.  Now he has a hernia on the left side, patient rectal examination is negative without any blood.  He was given Cipro 500 mg p.o. twice a day for 7 days he was advised to see the surgeon who was seeing him before.  We will do CBC and metabolic panel      Obesity (BMI 30-39.9)    - I encouraged the patient to lose weight.  - I educated them on making healthy dietary choices including eating more fruits and vegetables and less fried foods. - I encouraged the patient to exercise more, and educated on the benefits of exercise including weight loss, diabetes prevention, and hypertension prevention.   Dietary counseling with a registered dietician  Referral to a weight management support group (e.g. Weight Watchers, Overeaters Anonymous)  If your BMI is greater than 29 or you have gained more than 15 pounds you should work on weight loss.  Attend a healthy cooking class       Other Visit Diagnoses     Diverticulitis of large intestine with bleeding, unspecified complication status    -  Primary   Relevant  Medications   ciprofloxacin (CIPRO) 500 MG tablet   Rectal bleeding       Relevant Orders   CBC with Differential/Platelet   COMPLETE METABOLIC PANEL WITH GFR       Meds ordered this encounter  Medications   ciprofloxacin (CIPRO) 500 MG tablet    Sig: Take 1 tablet (500 mg total) by mouth 2 (two) times daily for 10 days.    Dispense:  20 tablet    Refill:  0    Follow-up: No follow-ups on file.    Corky Downs, MD

## 2021-08-20 LAB — CBC WITH DIFFERENTIAL/PLATELET
Absolute Monocytes: 583 cells/uL (ref 200–950)
Basophils Absolute: 22 cells/uL (ref 0–200)
Basophils Relative: 0.4 %
Eosinophils Absolute: 162 cells/uL (ref 15–500)
Eosinophils Relative: 3 %
HCT: 36.6 % — ABNORMAL LOW (ref 38.5–50.0)
Hemoglobin: 12.3 g/dL — ABNORMAL LOW (ref 13.2–17.1)
Lymphs Abs: 2111 cells/uL (ref 850–3900)
MCH: 33.9 pg — ABNORMAL HIGH (ref 27.0–33.0)
MCHC: 33.6 g/dL (ref 32.0–36.0)
MCV: 100.8 fL — ABNORMAL HIGH (ref 80.0–100.0)
MPV: 10 fL (ref 7.5–12.5)
Monocytes Relative: 10.8 %
Neutro Abs: 2522 cells/uL (ref 1500–7800)
Neutrophils Relative %: 46.7 %
Platelets: 170 10*3/uL (ref 140–400)
RBC: 3.63 10*6/uL — ABNORMAL LOW (ref 4.20–5.80)
RDW: 12.6 % (ref 11.0–15.0)
Total Lymphocyte: 39.1 %
WBC: 5.4 10*3/uL (ref 3.8–10.8)

## 2021-08-20 LAB — COMPLETE METABOLIC PANEL WITH GFR
AG Ratio: 1.5 (calc) (ref 1.0–2.5)
ALT: 15 U/L (ref 9–46)
AST: 21 U/L (ref 10–35)
Albumin: 4.1 g/dL (ref 3.6–5.1)
Alkaline phosphatase (APISO): 57 U/L (ref 35–144)
BUN/Creatinine Ratio: 13 (calc) (ref 6–22)
BUN: 18 mg/dL (ref 7–25)
CO2: 24 mmol/L (ref 20–32)
Calcium: 9 mg/dL (ref 8.6–10.3)
Chloride: 104 mmol/L (ref 98–110)
Creat: 1.35 mg/dL — ABNORMAL HIGH (ref 0.70–1.22)
Globulin: 2.7 g/dL (calc) (ref 1.9–3.7)
Glucose, Bld: 109 mg/dL — ABNORMAL HIGH (ref 65–99)
Potassium: 3.6 mmol/L (ref 3.5–5.3)
Sodium: 138 mmol/L (ref 135–146)
Total Bilirubin: 0.9 mg/dL (ref 0.2–1.2)
Total Protein: 6.8 g/dL (ref 6.1–8.1)
eGFR: 53 mL/min/{1.73_m2} — ABNORMAL LOW (ref >=60)

## 2021-08-22 ENCOUNTER — Ambulatory Visit (INDEPENDENT_AMBULATORY_CARE_PROVIDER_SITE_OTHER): Payer: Medicare Other | Admitting: Internal Medicine

## 2021-08-22 DIAGNOSIS — Z0000 Encounter for general adult medical examination without abnormal findings: Secondary | ICD-10-CM

## 2021-08-22 NOTE — Progress Notes (Signed)
Subjective:   Mark Blevins is a 80 y.o. male who presents for an Initial Medicare Annual Wellness Visit.  I discussed the limitations of evaluation and management by telemedicine and the availability of in person appointments. Patient expressed understanding and agreed to proceed.   Visit performed using audio  Patient:home Provider:home   Review of Systems    Defer to provider Cardiac Risk Factors include: advanced age (>69men, >69 women);male gender     Objective:    There were no vitals filed for this visit. There is no height or weight on file to calculate BMI.  Advanced Directives 08/22/2021 02/03/2020 02/03/2020 12/23/2019 05/06/2019 04/27/2019 04/24/2019  Does Patient Have a Medical Advance Directive? No No No No No No No  Would patient like information on creating a medical advance directive? No - Patient declined No - Patient declined No - Patient declined No - Patient declined No - Patient declined No - Patient declined No - Patient declined    Current Medications (verified) Outpatient Encounter Medications as of 08/22/2021  Medication Sig   amLODipine (NORVASC) 5 MG tablet Take 1 tablet (5 mg total) by mouth daily.   apixaban (ELIQUIS) 5 MG TABS tablet Take 1 tablet (5 mg total) by mouth 2 (two) times daily.   ciprofloxacin (CIPRO) 500 MG tablet Take 1 tablet (500 mg total) by mouth 2 (two) times daily for 10 days.   cloNIDine (CATAPRES) 0.1 MG tablet TAKE 1 TABLET BY MOUTH DAILY   labetalol (NORMODYNE) 200 MG tablet TAKE 1 TABLET BY MOUTH DAILY   Multiple Vitamin (MULTIVITAMIN WITH MINERALS) TABS tablet Take 1 tablet by mouth daily.   triamcinolone cream (KENALOG) 0.1 % Apply 1 application topically 2 (two) times daily.   No facility-administered encounter medications on file as of 08/22/2021.    Allergies (verified) Codeine and Morphine and related   History: Past Medical History:  Diagnosis Date   BPH (benign prostatic hyperplasia)    Hypertension    Venous  reflux    Past Surgical History:  Procedure Laterality Date   APPENDECTOMY     BACK SURGERY     COLECTOMY N/A 04/27/2019   Procedure: COLECTOMY WITH COLOSTOMY;  Surgeon: Sung Amabile, DO;  Location: [facility];  Service: General;  Laterality: N/A;   COLONOSCOPY WITH PROPOFOL N/A 04/24/2019   Procedure: COLONOSCOPY WITH PROPOFOL;  Surgeon: Sung Amabile, DO;  Location: [facility];  Service: General;  Laterality: N/A;   LAPAROSCOPIC LYSIS OF ADHESIONS  05/06/2019   Procedure: LAPAROSCOPIC LYSIS OF ADHESIONS;  Surgeon: Sung Amabile, DO;  Location: [facility];  Service: General;;   LAPAROSCOPY N/A 05/06/2019   Procedure: LAPAROSCOPY DIAGNOSTIC;  Surgeon: Sung Amabile, DO;  Location: [facility];  Service: General;  Laterality: N/A;   REPLACEMENT TOTAL KNEE BILATERAL     XI ROBOTIC ASSISTED COLOSTOMY TAKEDOWN N/A 02/03/2020   Procedure: XI ROBOTIC ASSISTED COLOSTOMY TAKEDOWN;  Surgeon: Sung Amabile, DO;  Location: [facility];  Service: General;  Laterality: N/A;   History reviewed. No pertinent family history. Social History   Socioeconomic History   Marital status: Married    Spouse name: Not on file   Number of children: Not on file   Years of education: Not on file   Highest education level: Not on file  Occupational History   Not on file  Tobacco Use   Smoking status: Former   Smokeless tobacco: Current    Types: Chew   Tobacco comments:    quit smoking 1969 or 1970  Vaping  Use   Vaping Use: Never used  Substance and Sexual Activity   Alcohol use: Yes    Comment: socially. 1beer yesterday   Drug use: Never   Sexual activity: Yes  Other Topics Concern   Not on file  Social History Narrative   Not on file   Social Determinants of Health   Financial Resource Strain: Low Risk    Difficulty of Paying Living Expenses: Not hard at all  Food Insecurity: No Food Insecurity   Worried About Running Out of Food in the Last Year: Never true   Ran Out of Food in the Last Year: Never true   Transportation Needs: No Transportation Needs   Lack of Transportation (Medical): No   Lack of Transportation (Non-Medical): No  Physical Activity: Insufficiently Active   Days of Exercise per Week: 2 days   Minutes of Exercise per Session: 20 min  Stress: No Stress Concern Present   Feeling of Stress : Not at all  Social Connections: Socially Integrated   Frequency of Communication with Friends and Family: More than three times a week   Frequency of Social Gatherings with Friends and Family: More than three times a week   Attends Religious Services: More than 4 times per year   Active Member of [employer] or Organizations: Yes   Attends Engineer, structural: More than 4 times per year   Marital Status: Married    Tobacco Counseling Ready to quit: Not Answered Counseling given: Not Answered Tobacco comments: quit smoking 1969 or 1970   Clinical Intake:  Pre-visit preparation completed: Yes  Pain : No/denies pain     Nutritional Risks: None Diabetes: No  How often do you need to have someone help you when you read instructions, pamphlets, or other written materials from your doctor or pharmacy?: 1 - Never What is the last grade level you completed in school?: 12th and some training after highschool  Diabetic?No  Interpreter Needed?: No  Information entered by :: Melody Comas, CMA   Activities of Daily Living In your present state of health, do you have any difficulty performing the following activities: 08/22/2021  Hearing? N  Vision? N  Difficulty concentrating or making decisions? N  Walking or climbing stairs? N  Dressing or bathing? N  Doing errands, shopping? N  Preparing Food and eating ? N  Using the Toilet? N  In the past six months, have you accidently leaked urine? N  Do you have problems with loss of bowel control? N  Managing your Medications? N  Managing your Finances? N  Housekeeping or managing your Housekeeping? N  Some recent data  might be hidden    Patient Care Team: Corky Downs, MD as PCP - General (Internal Medicine)  Indicate any recent Medical Services you may have received from other than [facility] providers in the past year (date may be approximate).     Assessment:   This is a routine wellness examination for [place].  Hearing/Vision screen No results found.  Dietary issues and exercise activities discussed: Current Exercise Habits: The patient does not participate in regular exercise at present, Exercise limited by: None identified   Goals Addressed   None   Depression Screen PHQ 2/9 Scores 08/22/2021 08/22/2021 04/05/2021  PHQ - 2 Score 0 0 0    Fall Risk Fall Risk  08/22/2021 08/19/2021  Falls in the past year? 0 0  Number falls in past yr: 0 0  Injury with Fall? 0 0  Risk for fall  due to : No Fall Risks No Fall Risks  Follow up Falls evaluation completed Falls evaluation completed    FALL RISK PREVENTION PERTAINING TO THE HOME:  Any stairs in or around the home? No  If so, are there any without handrails? No  Home free of loose throw rugs in walkways, pet beds, electrical cords, etc? Yes  Adequate lighting in your home to reduce risk of falls? Yes   ASSISTIVE DEVICES UTILIZED TO PREVENT FALLS:  Life alert? No  Use of a cane, walker or w/c? No  Grab bars in the bathroom? No  Shower chair or bench in shower? No  Elevated toilet seat or a handicapped toilet? No   TIMED UP AND GO:  Was the test performed? No .  Length of time to ambulate- NA  Gait slow and steady without use of assistive device  Cognitive Function: MMSE - Mini Mental State Exam 08/22/2021  Not completed: Unable to complete     6CIT Screen 08/22/2021  What Year? 0 points  What month? 0 points  What time? 0 points  Count back from 20 0 points  Months in reverse 0 points  Repeat phrase 0 points  Total Score 0    Immunizations  There is no immunization history on file for this patient.  TDAP status: Due,  Education has been provided regarding the importance of this vaccine. Advised may receive this vaccine at local pharmacy or Health Dept. Aware to provide a copy of the vaccination record if obtained from local pharmacy or Health Dept. Verbalized acceptance and understanding.  Flu Vaccine status: Declined, Education has been provided regarding the importance of this vaccine but patient still declined. Advised may receive this vaccine at local pharmacy or Health Dept. Aware to provide a copy of the vaccination record if obtained from local pharmacy or Health Dept. Verbalized acceptance and understanding.  Pneumococcal vaccine status: Declined,  Education has been provided regarding the importance of this vaccine but patient still declined. Advised may receive this vaccine at local pharmacy or Health Dept. Aware to provide a copy of the vaccination record if obtained from local pharmacy or Health Dept. Verbalized acceptance and understanding.   Covid-19 vaccine status: Declined, Education has been provided regarding the importance of this vaccine but patient still declined. Advised may receive this vaccine at local pharmacy or Health Dept.or vaccine clinic. Aware to provide a copy of the vaccination record if obtained from local pharmacy or Health Dept. Verbalized acceptance and understanding.  Qualifies for Shingles Vaccine? No   Zostavax completed No   Shingrix Completed?: No.    Education has been provided regarding the importance of this vaccine. Patient has been advised to call insurance company to determine out of pocket expense if they have not yet received this vaccine. Advised may also receive vaccine at local pharmacy or Health Dept. Verbalized acceptance and understanding.  Screening Tests Health Maintenance  Topic Date Due   COVID-19 Vaccine (1) Never done   TETANUS/TDAP  Never done   Zoster Vaccines- Shingrix (1 of 2) Never done   INFLUENZA VACCINE  Never done   HPV VACCINES  Aged Out     Health Maintenance  Health Maintenance Due  Topic Date Due   COVID-19 Vaccine (1) Never done   TETANUS/TDAP  Never done   Zoster Vaccines- Shingrix (1 of 2) Never done   INFLUENZA VACCINE  Never done    Colorectal cancer screening: No longer required.   Lung Cancer Screening: (Low Dose CT  Chest recommended if Age 51-80 years, 30 pack-year currently smoking OR have quit w/in 15years.) does not qualify.   Lung Cancer Screening Referral: NA  Additional Screening:  Hepatitis C Screening: does not qualify; Completed- No  Vision Screening: Recommended annual ophthalmology exams for early detection of glaucoma and other disorders of the eye. Is the patient up to date with their annual eye exam?  Yes  Who is the provider or what is the name of the office in which the patient attends annual eye exams? [facility]  If pt is not established with a provider, would they like to be referred to a provider to establish care?  Patient already established .   Dental Screening: Recommended annual dental exams for proper oral hygiene  Community Resource Referral / Chronic Care Management: CRR required this visit?  No   CCM required this visit?  No      Plan:     I have personally reviewed and noted the following in the patient's chart:   Medical and social history Use of alcohol, tobacco or illicit drugs  Current medications and supplements including opioid prescriptions. Patient is not currently taking opioid prescriptions. Functional ability and status Nutritional status Physical activity Advanced directives List of other physicians Hospitalizations, surgeries, and ER visits in previous 12 months Vitals Screenings to include cognitive, depression, and falls Referrals and appointments  In addition, I have reviewed and discussed with patient certain preventive protocols, quality metrics, and best practice recommendations. A written personalized care plan for preventive services  as well as general preventive health recommendations were provided to patient.     Melody Comas, [place]   08/22/2021   Nurse Notes:  Mr. Mccathern , Thank you for taking time to come for your Medicare Wellness Visit. I appreciate your ongoing commitment to your health goals. Please review the following plan we discussed and let me know if I can assist you in the future.   These are the goals we discussed:  Goals   None     This is a list of the screening recommended for you and due dates:  Health Maintenance  Topic Date Due   COVID-19 Vaccine (1) Never done   Tetanus Vaccine  Never done   Zoster (Shingles) Vaccine (1 of 2) Never done   Flu Shot  Never done   HPV Vaccine  Aged Out       Time spent with patient 35 min

## 2021-08-23 NOTE — Progress Notes (Signed)
I have reviewed this visit and agree with the documentation.   

## 2021-08-27 ENCOUNTER — Ambulatory Visit (INDEPENDENT_AMBULATORY_CARE_PROVIDER_SITE_OTHER): Payer: Medicare Other | Admitting: Internal Medicine

## 2021-08-27 ENCOUNTER — Encounter: Payer: Self-pay | Admitting: Internal Medicine

## 2021-08-27 ENCOUNTER — Other Ambulatory Visit: Payer: Self-pay

## 2021-08-27 VITALS — BP 138/81 | HR 67 | Ht 73.0 in | Wt 236.0 lb

## 2021-08-27 DIAGNOSIS — I10 Essential (primary) hypertension: Secondary | ICD-10-CM

## 2021-08-27 DIAGNOSIS — I83892 Varicose veins of left lower extremities with other complications: Secondary | ICD-10-CM

## 2021-08-27 DIAGNOSIS — E669 Obesity, unspecified: Secondary | ICD-10-CM | POA: Diagnosis not present

## 2021-08-27 DIAGNOSIS — K9409 Other complications of colostomy: Secondary | ICD-10-CM | POA: Diagnosis not present

## 2021-08-27 NOTE — Assessment & Plan Note (Signed)
Varicose veins are stable

## 2021-08-27 NOTE — Progress Notes (Signed)
Established Patient Office Visit  Subjective:  Patient ID: Mark Blevins, male    DOB: 1941/04/25  Age: 80 y.o. MRN: [id number]  CC:  Chief Complaint  Patient presents with   lab results    HPI  Mark Blevins presents for gi bleed  Past Medical History:  Diagnosis Date   BPH (benign prostatic hyperplasia)    Hypertension    Venous reflux     Past Surgical History:  Procedure Laterality Date   APPENDECTOMY     BACK SURGERY     COLECTOMY N/A 04/27/2019   Procedure: COLECTOMY WITH COLOSTOMY;  Surgeon: [name], DO;  Location: [facility];  Service: General;  Laterality: N/A;   COLONOSCOPY WITH PROPOFOL N/A 04/24/2019   Procedure: COLONOSCOPY WITH PROPOFOL;  Surgeon: [name], DO;  Location: [facility];  Service: General;  Laterality: N/A;   LAPAROSCOPIC LYSIS OF ADHESIONS  05/06/2019   Procedure: LAPAROSCOPIC LYSIS OF ADHESIONS;  Surgeon: [name], DO;  Location: [facility];  Service: General;;   LAPAROSCOPY N/A 05/06/2019   Procedure: LAPAROSCOPY DIAGNOSTIC;  Surgeon: [name], DO;  Location: [facility];  Service: General;  Laterality: N/A;   REPLACEMENT TOTAL KNEE BILATERAL     XI ROBOTIC ASSISTED COLOSTOMY TAKEDOWN N/A 02/03/2020   Procedure: XI ROBOTIC ASSISTED COLOSTOMY TAKEDOWN;  Surgeon: [name], DO;  Location: [facility];  Service: General;  Laterality: N/A;    History reviewed. No pertinent family history.  Social History   Socioeconomic History   Marital status: Married    Spouse name: Not on file   Number of children: Not on file   Years of education: Not on file   Highest education level: Not on file  Occupational History   Not on file  Tobacco Use   Smoking status: Former   Smokeless tobacco: Current    Types: Chew   Tobacco comments:    quit smoking 1969 or 1970  Vaping Use   Vaping Use: Never used  Substance and Sexual Activity   Alcohol use: Yes    Comment: socially. 1beer yesterday   Drug use: Never   Sexual activity: Yes   Other Topics Concern   Not on file  Social History Narrative   Not on file   Social Determinants of Health   Financial Resource Strain: Low Risk    Difficulty of Paying Living Expenses: Not hard at all  Food Insecurity: No Food Insecurity   Worried About [place] in the Last Year: Never true   [place] in the Last Year: Never true  Transportation Needs: No Transportation Needs   Lack of Transportation (Medical): No   Lack of Transportation (Non-Medical): No  Physical Activity: Insufficiently Active   Days of Exercise per Week: 2 days   Minutes of Exercise per Session: 20 min  Stress: No Stress Concern Present   Feeling of Stress : Not at all  Social Connections: Socially Integrated   Frequency of Communication with Friends and Family: More than three times a week   Frequency of Social Gatherings with Friends and Family: More than three times a week   Attends Religious Services: More than 4 times per year   Active Member of [employer] or Organizations: Yes   Attends Music therapist: More than 4 times per year   Marital Status: Married  Human resources officer Violence: Not At Risk   Fear of Current or Ex-Partner: No   Emotionally Abused: No   Physically Abused: No  Sexually Abused: No     Current Outpatient Medications:    amLODipine (NORVASC) 5 MG tablet, Take 1 tablet (5 mg total) by mouth daily., Disp: 90 tablet, Rfl: 3   apixaban (ELIQUIS) 5 MG TABS tablet, Take 1 tablet (5 mg total) by mouth 2 (two) times daily., Disp: 60 tablet, Rfl: 6   ciprofloxacin (CIPRO) 500 MG tablet, Take 1 tablet (500 mg total) by mouth 2 (two) times daily for 10 days., Disp: 20 tablet, Rfl: 0   cloNIDine (CATAPRES) 0.1 MG tablet, TAKE 1 TABLET BY MOUTH DAILY, Disp: 90 tablet, Rfl: 2   labetalol (NORMODYNE) 200 MG tablet, TAKE 1 TABLET BY MOUTH DAILY, Disp: 90 tablet, Rfl: 3   Multiple Vitamin (MULTIVITAMIN WITH MINERALS) TABS tablet, Take 1 tablet by mouth daily., Disp: ,  Rfl:    triamcinolone cream (KENALOG) 0.1 %, Apply 1 application topically 2 (two) times daily., Disp: 30 g, Rfl: 0   Allergies  Allergen Reactions   Codeine Nausea And Vomiting   Morphine And Related Nausea And Vomiting    ROS Review of Systems  Constitutional: Negative.   HENT: Negative.    Eyes: Negative.   Respiratory: Negative.    Cardiovascular: Negative.   Gastrointestinal: Negative.   Endocrine: Negative.   Genitourinary: Negative.   Musculoskeletal: Negative.   Skin: Negative.   Allergic/Immunologic: Negative.   Neurological: Negative.   Hematological: Negative.   Psychiatric/Behavioral: Negative.    All other systems reviewed and are negative.    Objective:    Physical Exam Vitals reviewed.  Constitutional:      Appearance: Normal appearance.  HENT:     Mouth/Throat:     Mouth: Mucous membranes are moist.  Eyes:     Pupils: Pupils are equal, round, and reactive to light.  Neck:     Vascular: No carotid bruit.  Cardiovascular:     Rate and Rhythm: Normal rate and regular rhythm.     Pulses: Normal pulses.     Heart sounds: Normal heart sounds.  Pulmonary:     Effort: Pulmonary effort is normal.     Breath sounds: Normal breath sounds.  Abdominal:     General: Bowel sounds are normal.     Palpations: Abdomen is soft. There is no hepatomegaly, splenomegaly or mass.     Tenderness: There is no abdominal tenderness.     Hernia: No hernia is present.     Comments: Hernia lt side  Musculoskeletal:     Cervical back: Neck supple.     Right lower leg: No edema.     Left lower leg: No edema.     Comments: Varicose veins  Skin:    Findings: No rash.  Neurological:     Mental Status: He is alert and oriented to person, place, and time.     Motor: No weakness.  Psychiatric:        Mood and Affect: Mood normal.        Behavior: Behavior normal.    BP 138/81   Pulse 67   Ht 6' 1" (1.854 m)   Wt 236 lb (107 kg)   BMI 31.14 kg/m  Wt Readings from  Last 3 Encounters:  08/27/21 236 lb (107 kg)  08/19/21 234 lb 9.6 oz (106.4 kg)  07/08/21 239 lb (108.4 kg)     There are no preventive care reminders to display for this patient.  There are no preventive care reminders to display for this patient.  No results found for: TSH  Lab Results  Component Value Date   WBC 5.4 08/19/2021   HGB 12.3 (L) 08/19/2021   HCT 36.6 (L) 08/19/2021   MCV 100.8 (H) 08/19/2021   PLT 170 08/19/2021   Lab Results  Component Value Date   NA 138 08/19/2021   K 3.6 08/19/2021   CO2 24 08/19/2021   GLUCOSE 109 (H) 08/19/2021   BUN 18 08/19/2021   CREATININE 1.35 (H) 08/19/2021   BILITOT 0.9 08/19/2021   ALKPHOS 70 05/09/2019   AST 21 08/19/2021   ALT 15 08/19/2021   PROT 6.8 08/19/2021   ALBUMIN 2.6 (L) 05/09/2019   CALCIUM 9.0 08/19/2021   ANIONGAP 9 02/04/2020   EGFR 53 (L) 08/19/2021   No results found for: CHOL No results found for: HDL No results found for: [facility] Lab Results  Component Value Date   TRIG 106 05/09/2019   No results found for: CHOLHDL No results found for: HGBA1C    Assessment & Plan:   Problem List Items Addressed This Visit       Cardiovascular and Mediastinum   Hypertension    Blood pressure is under control      Varicose veins of leg with swelling, left - Primary    Varicose veins are stable        Other   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       No orders of the defined types were placed in this encounter.   Follow-up: No follow-ups on file.    Cletis Athens, MD

## 2021-08-27 NOTE — Assessment & Plan Note (Signed)
Patient was referred to GI

## 2021-08-27 NOTE — Assessment & Plan Note (Signed)
Behavioral modification strategies: increasing lean protein intake, decreasing simple carbohydrates, increasing vegetables, increasing water intake, decreasing eating out, no skipping meals, meal planning and cooking strategies, keeping healthy foods in the home and planning for success. 

## 2021-08-27 NOTE — Assessment & Plan Note (Signed)
Blood pressure is under control 

## 2021-09-03 ENCOUNTER — Other Ambulatory Visit: Payer: Self-pay | Admitting: *Deleted

## 2021-09-03 ENCOUNTER — Other Ambulatory Visit: Payer: Self-pay | Admitting: Surgery

## 2021-09-03 DIAGNOSIS — K625 Hemorrhage of anus and rectum: Secondary | ICD-10-CM | POA: Diagnosis not present

## 2021-09-03 MED ORDER — CIPROFLOXACIN HCL 500 MG PO TABS: 500.0000 mg | ORAL_TABLET | Freq: Two times a day (BID) | ORAL | 0 refills | Status: DC

## 2021-09-05 ENCOUNTER — Other Ambulatory Visit: Payer: Self-pay

## 2021-09-05 ENCOUNTER — Ambulatory Visit
Admission: RE | Admit: 2021-09-05 | Discharge: 2021-09-05 | Disposition: A | Discharge status: Home / Self Care | Payer: Medicare Other | Source: Ambulatory Visit | Attending: Surgery | Admitting: Surgery

## 2021-09-05 DIAGNOSIS — R140 Abdominal distension (gaseous): Secondary | ICD-10-CM | POA: Insufficient documentation

## 2021-09-05 DIAGNOSIS — I700 Atherosclerosis of aorta: Secondary | ICD-10-CM | POA: Insufficient documentation

## 2021-09-05 DIAGNOSIS — I2510 Atherosclerotic heart disease of native coronary artery without angina pectoris: Secondary | ICD-10-CM | POA: Insufficient documentation

## 2021-09-05 DIAGNOSIS — K625 Hemorrhage of anus and rectum: Secondary | ICD-10-CM | POA: Diagnosis not present

## 2021-09-05 DIAGNOSIS — R109 Unspecified abdominal pain: Secondary | ICD-10-CM | POA: Diagnosis not present

## 2021-09-05 MED ORDER — IOHEXOL 300 MG/ML  SOLN
100.0000 mL | Freq: Once | INTRAMUSCULAR | Status: AC | PRN
  Administered 2021-09-05: 100 mL via INTRAVENOUS

## 2021-09-24 ENCOUNTER — Ambulatory Visit: Payer: Medicare Other

## 2021-10-15 ENCOUNTER — Ambulatory Visit (INDEPENDENT_AMBULATORY_CARE_PROVIDER_SITE_OTHER): Payer: Medicare Other | Admitting: Internal Medicine

## 2021-10-15 ENCOUNTER — Other Ambulatory Visit: Payer: Self-pay

## 2021-10-15 ENCOUNTER — Encounter: Payer: Self-pay | Admitting: Internal Medicine

## 2021-10-15 VITALS — BP 122/75 | HR 75 | Ht 73.0 in | Wt 241.2 lb

## 2021-10-15 DIAGNOSIS — I83892 Varicose veins of left lower extremities with other complications: Secondary | ICD-10-CM | POA: Diagnosis not present

## 2021-10-15 DIAGNOSIS — I482 Chronic atrial fibrillation, unspecified: Secondary | ICD-10-CM

## 2021-10-15 DIAGNOSIS — Z933 Colostomy status: Secondary | ICD-10-CM | POA: Diagnosis not present

## 2021-10-15 DIAGNOSIS — E669 Obesity, unspecified: Secondary | ICD-10-CM | POA: Diagnosis not present

## 2021-10-15 DIAGNOSIS — I10 Essential (primary) hypertension: Secondary | ICD-10-CM

## 2021-10-15 NOTE — Assessment & Plan Note (Signed)
Laceration has been repaired.

## 2021-10-15 NOTE — Assessment & Plan Note (Signed)

## 2021-10-15 NOTE — Progress Notes (Signed)
Established Patient Office Visit  Subjective:  Patient ID: Mark Blevins, male    DOB: 1941-02-26  Age: 80 y.o. MRN: [id number]  CC:  Chief Complaint  Patient presents with   Follow-up    Follow up 2 month. Patient states that the previous issue of blood in his stool has cleared up since last visit.     HPI  Mark Blevins presents for hbp  Past Medical History:  Diagnosis Date   BPH (benign prostatic hyperplasia)    Hypertension    Venous reflux     Past Surgical History:  Procedure Laterality Date   APPENDECTOMY     BACK SURGERY     COLECTOMY N/A 04/27/2019   Procedure: COLECTOMY WITH COLOSTOMY;  Surgeon: [name], DO;  Location: [facility];  Service: General;  Laterality: N/A;   COLONOSCOPY WITH PROPOFOL N/A 04/24/2019   Procedure: COLONOSCOPY WITH PROPOFOL;  Surgeon: [name], DO;  Location: [facility];  Service: General;  Laterality: N/A;   LAPAROSCOPIC LYSIS OF ADHESIONS  05/06/2019   Procedure: LAPAROSCOPIC LYSIS OF ADHESIONS;  Surgeon: [name], DO;  Location: [facility];  Service: General;;   LAPAROSCOPY N/A 05/06/2019   Procedure: LAPAROSCOPY DIAGNOSTIC;  Surgeon: [name], DO;  Location: [facility];  Service: General;  Laterality: N/A;   REPLACEMENT TOTAL KNEE BILATERAL     XI ROBOTIC ASSISTED COLOSTOMY TAKEDOWN N/A 02/03/2020   Procedure: XI ROBOTIC ASSISTED COLOSTOMY TAKEDOWN;  Surgeon: [name], DO;  Location: [facility];  Service: General;  Laterality: N/A;    History reviewed. No pertinent family history.  Social History   Socioeconomic History   Marital status: Married    Spouse name: Not on file   Number of children: Not on file   Years of education: Not on file   Highest education level: Not on file  Occupational History   Not on file  Tobacco Use   Smoking status: Former   Smokeless tobacco: Current    Types: Chew   Tobacco comments:    quit smoking 1969 or 1970  Vaping Use   Vaping Use: Never used  Substance and Sexual  Activity   Alcohol use: Yes    Comment: socially. 1beer yesterday   Drug use: Never   Sexual activity: Yes  Other Topics Concern   Not on file  Social History Narrative   Not on file   Social Determinants of Health   Financial Resource Strain: Low Risk    Difficulty of Paying Living Expenses: Not hard at all  Food Insecurity: No Food Insecurity   Worried About [place] in the Last Year: Never true   [place] in the Last Year: Never true  Transportation Needs: No Transportation Needs   Lack of Transportation (Medical): No   Lack of Transportation (Non-Medical): No  Physical Activity: Insufficiently Active   Days of Exercise per Week: 2 days   Minutes of Exercise per Session: 20 min  Stress: No Stress Concern Present   Feeling of Stress : Not at all  Social Connections: Socially Integrated   Frequency of Communication with Friends and Family: More than three times a week   Frequency of Social Gatherings with Friends and Family: More than three times a week   Attends Religious Services: More than 4 times per year   Active Member of [employer] or Organizations: Yes   Attends Music therapist: More than 4 times per year   Marital Status: Married  Intimate  Partner Violence: Not At Risk   Fear of Current or Ex-Partner: No   Emotionally Abused: No   Physically Abused: No   Sexually Abused: No     Current Outpatient Medications:    amLODipine (NORVASC) 5 MG tablet, Take 1 tablet (5 mg total) by mouth daily., Disp: 90 tablet, Rfl: 3   apixaban (ELIQUIS) 5 MG TABS tablet, Take 1 tablet (5 mg total) by mouth 2 (two) times daily., Disp: 60 tablet, Rfl: 6   ciprofloxacin (CIPRO) 500 MG tablet, Take 1 tablet (500 mg total) by mouth 2 (two) times daily., Disp: 30 tablet, Rfl: 0   cloNIDine (CATAPRES) 0.1 MG tablet, TAKE 1 TABLET BY MOUTH DAILY, Disp: 90 tablet, Rfl: 2   labetalol (NORMODYNE) 200 MG tablet, TAKE 1 TABLET BY MOUTH DAILY, Disp: 90 tablet, Rfl: 3    Multiple Vitamin (MULTIVITAMIN WITH MINERALS) TABS tablet, Take 1 tablet by mouth daily., Disp: , Rfl:    triamcinolone cream (KENALOG) 0.1 %, Apply 1 application topically 2 (two) times daily., Disp: 30 g, Rfl: 0   Allergies  Allergen Reactions   Codeine Nausea And Vomiting   Morphine And Related Nausea And Vomiting    ROS Review of Systems  Constitutional: Negative.   HENT: Negative.    Eyes: Negative.   Respiratory: Negative.    Cardiovascular: Negative.   Gastrointestinal: Negative.   Endocrine: Negative.   Genitourinary: Negative.   Musculoskeletal: Negative.   Skin: Negative.   Allergic/Immunologic: Negative.   Neurological: Negative.   Hematological: Negative.   Psychiatric/Behavioral: Negative.    All other systems reviewed and are negative.    Objective:    Physical Exam Vitals reviewed.  Constitutional:      Appearance: Normal appearance.  HENT:     Mouth/Throat:     Mouth: Mucous membranes are moist.  Eyes:     Pupils: Pupils are equal, round, and reactive to light.  Neck:     Vascular: No carotid bruit.  Cardiovascular:     Rate and Rhythm: Normal rate and regular rhythm.     Pulses: Normal pulses.     Heart sounds: Normal heart sounds.  Pulmonary:     Effort: Pulmonary effort is normal.     Breath sounds: Normal breath sounds.  Abdominal:     General: Bowel sounds are normal.     Palpations: Abdomen is soft. There is no hepatomegaly, splenomegaly or mass.     Tenderness: There is no abdominal tenderness.     Hernia: No hernia is present.  Musculoskeletal:     Cervical back: Neck supple.     Right lower leg: No edema.     Left lower leg: No edema.  Skin:    Findings: No rash.  Neurological:     Mental Status: He is alert and oriented to person, place, and time.     Motor: No weakness.  Psychiatric:        Mood and Affect: Mood normal.        Behavior: Behavior normal.    BP 122/75   Pulse 75   Ht $R'6\' 1"'Sp$  (1.854 m)   Wt 241 lb 3.2 oz  (109.4 kg)   BMI 31.82 kg/m  Wt Readings from Last 3 Encounters:  10/15/21 241 lb 3.2 oz (109.4 kg)  08/27/21 236 lb (107 kg)  08/19/21 234 lb 9.6 oz (106.4 kg)     Health Maintenance Due  Topic Date Due   COVID-19 Vaccine (1) Never done   Pneumonia Vaccine  78+ Years old (1 - PCV) Never done    There are no preventive care reminders to display for this patient.  No results found for: TSH Lab Results  Component Value Date   WBC 5.4 08/19/2021   HGB 12.3 (L) 08/19/2021   HCT 36.6 (L) 08/19/2021   MCV 100.8 (H) 08/19/2021   PLT 170 08/19/2021   Lab Results  Component Value Date   NA 138 08/19/2021   K 3.6 08/19/2021   CO2 24 08/19/2021   GLUCOSE 109 (H) 08/19/2021   BUN 18 08/19/2021   CREATININE 1.35 (H) 08/19/2021   BILITOT 0.9 08/19/2021   ALKPHOS 70 05/09/2019   AST 21 08/19/2021   ALT 15 08/19/2021   PROT 6.8 08/19/2021   ALBUMIN 2.6 (L) 05/09/2019   CALCIUM 9.0 08/19/2021   ANIONGAP 9 02/04/2020   EGFR 53 (L) 08/19/2021   No results found for: CHOL No results found for: HDL No results found for: [facility] Lab Results  Component Value Date   TRIG 106 05/09/2019   No results found for: CHOLHDL No results found for: HGBA1C    Assessment & Plan:   Problem List Items Addressed This Visit       Cardiovascular and Mediastinum   Hypertension    Blood pressure stable      Varicose veins of leg with swelling, left - Primary   Chronic atrial fibrillation ([place])    Patient continues to take Eliquis        Other   Colostomy in place [facility])    Laceration has been repaired.      Obesity (BMI 30-39.9)    - I encouraged the patient to lose weight.  - I educated them on making healthy dietary choices including eating more fruits and vegetables and less fried foods. - I encouraged the patient to exercise more, and educated on the benefits of exercise including weight loss, diabetes prevention, and hypertension prevention.   Dietary counseling with a  registered dietician  Referral to a weight management support group (e.g. Weight Watchers, Overeaters Anonymous)  If your BMI is greater than 29 or you have gained more than 15 pounds you should work on weight loss.  Attend a healthy cooking class        No orders of the defined types were placed in this encounter.   Follow-up: No follow-ups on file.    Cletis Athens, MD

## 2021-10-15 NOTE — Assessment & Plan Note (Signed)
Blood pressure stable ? ?

## 2021-10-15 NOTE — Assessment & Plan Note (Signed)
Patient continues to take Eliquis

## 2021-12-16 DIAGNOSIS — I10 Essential (primary) hypertension: Secondary | ICD-10-CM | POA: Diagnosis not present

## 2021-12-16 DIAGNOSIS — I350 Nonrheumatic aortic (valve) stenosis: Secondary | ICD-10-CM | POA: Diagnosis not present

## 2021-12-16 DIAGNOSIS — I4819 Other persistent atrial fibrillation: Secondary | ICD-10-CM | POA: Diagnosis not present

## 2021-12-26 DIAGNOSIS — H35372 Puckering of macula, left eye: Secondary | ICD-10-CM | POA: Diagnosis not present

## 2022-01-09 ENCOUNTER — Other Ambulatory Visit: Payer: Self-pay | Admitting: Internal Medicine

## 2022-01-13 ENCOUNTER — Encounter: Payer: Self-pay | Admitting: Internal Medicine

## 2022-01-13 ENCOUNTER — Other Ambulatory Visit: Payer: Self-pay

## 2022-01-13 ENCOUNTER — Ambulatory Visit (INDEPENDENT_AMBULATORY_CARE_PROVIDER_SITE_OTHER): Payer: Medicare Other | Admitting: Internal Medicine

## 2022-01-13 VITALS — BP 140/80 | HR 68 | Ht 73.0 in | Wt 249.1 lb

## 2022-01-13 DIAGNOSIS — E669 Obesity, unspecified: Secondary | ICD-10-CM | POA: Diagnosis not present

## 2022-01-13 DIAGNOSIS — I10 Essential (primary) hypertension: Secondary | ICD-10-CM

## 2022-01-13 DIAGNOSIS — I83892 Varicose veins of left lower extremities with other complications: Secondary | ICD-10-CM

## 2022-01-13 DIAGNOSIS — I482 Chronic atrial fibrillation, unspecified: Secondary | ICD-10-CM | POA: Diagnosis not present

## 2022-01-13 NOTE — Assessment & Plan Note (Signed)

## 2022-01-13 NOTE — Assessment & Plan Note (Signed)

## 2022-01-13 NOTE — Progress Notes (Signed)
Established Patient Office Visit  Subjective:  Patient ID: Mark Blevins, male    DOB: 08-15-1941  Age: 81 y.o. MRN: [id number]  CC:  Chief Complaint  Patient presents with   Follow-up    HPI  Mark Blevins presents for general check  Past Medical History:  Diagnosis Date   BPH (benign prostatic hyperplasia)    Hypertension    Venous reflux     Past Surgical History:  Procedure Laterality Date   APPENDECTOMY     BACK SURGERY     COLECTOMY N/A 04/27/2019   Procedure: COLECTOMY WITH COLOSTOMY;  Surgeon: [name], DO;  Location: [facility];  Service: General;  Laterality: N/A;   COLONOSCOPY WITH PROPOFOL N/A 04/24/2019   Procedure: COLONOSCOPY WITH PROPOFOL;  Surgeon: [name], DO;  Location: [facility];  Service: General;  Laterality: N/A;   LAPAROSCOPIC LYSIS OF ADHESIONS  05/06/2019   Procedure: LAPAROSCOPIC LYSIS OF ADHESIONS;  Surgeon: [name], DO;  Location: [facility];  Service: General;;   LAPAROSCOPY N/A 05/06/2019   Procedure: LAPAROSCOPY DIAGNOSTIC;  Surgeon: [name], DO;  Location: [facility];  Service: General;  Laterality: N/A;   REPLACEMENT TOTAL KNEE BILATERAL     XI ROBOTIC ASSISTED COLOSTOMY TAKEDOWN N/A 02/03/2020   Procedure: XI ROBOTIC ASSISTED COLOSTOMY TAKEDOWN;  Surgeon: [name], DO;  Location: [facility];  Service: General;  Laterality: N/A;    History reviewed. No pertinent family history.  Social History   Socioeconomic History   Marital status: Married    Spouse name: Not on file   Number of children: Not on file   Years of education: Not on file   Highest education level: Not on file  Occupational History   Not on file  Tobacco Use   Smoking status: Former   Smokeless tobacco: Current    Types: Chew   Tobacco comments:    quit smoking 1969 or 1970  Vaping Use   Vaping Use: Never used  Substance and Sexual Activity   Alcohol use: Yes    Comment: socially. 1beer yesterday   Drug use: Never   Sexual activity: Yes   Other Topics Concern   Not on file  Social History Narrative   Not on file   Social Determinants of Health   Financial Resource Strain: Low Risk    Difficulty of Paying Living Expenses: Not hard at all  Food Insecurity: No Food Insecurity   Worried About [place] in the Last Year: Never true   [place] in the Last Year: Never true  Transportation Needs: No Transportation Needs   Lack of Transportation (Medical): No   Lack of Transportation (Non-Medical): No  Physical Activity: Insufficiently Active   Days of Exercise per Week: 2 days   Minutes of Exercise per Session: 20 min  Stress: No Stress Concern Present   Feeling of Stress : Not at all  Social Connections: Socially Integrated   Frequency of Communication with Friends and Family: More than three times a week   Frequency of Social Gatherings with Friends and Family: More than three times a week   Attends Religious Services: More than 4 times per year   Active Member of [employer] or Organizations: Yes   Attends Music therapist: More than 4 times per year   Marital Status: Married  Human resources officer Violence: Not At Risk   Fear of Current or Ex-Partner: No   Emotionally Abused: No   Physically Abused: No  Sexually Abused: No     Current Outpatient Medications:    amLODipine (NORVASC) 5 MG tablet, Take 1 tablet (5 mg total) by mouth daily., Disp: 90 tablet, Rfl: 3   apixaban (ELIQUIS) 5 MG TABS tablet, Take 1 tablet (5 mg total) by mouth 2 (two) times daily., Disp: 60 tablet, Rfl: 6   cloNIDine (CATAPRES) 0.1 MG tablet, TAKE 1 TABLET BY MOUTH DAILY, Disp: 90 tablet, Rfl: 2   labetalol (NORMODYNE) 200 MG tablet, TAKE 1 TABLET BY MOUTH DAILY, Disp: 90 tablet, Rfl: 3   Multiple Vitamin (MULTIVITAMIN WITH MINERALS) TABS tablet, Take 1 tablet by mouth daily., Disp: , Rfl:    triamcinolone cream (KENALOG) 0.1 %, Apply 1 application topically 2 (two) times daily., Disp: 30 g, Rfl: 0   Allergies   Allergen Reactions   Codeine Nausea And Vomiting   Morphine And Related Nausea And Vomiting    ROS Review of Systems  Constitutional: Negative.   HENT: Negative.    Eyes: Negative.   Respiratory: Negative.    Cardiovascular: Negative.   Gastrointestinal: Negative.   Endocrine: Negative.   Genitourinary: Negative.   Musculoskeletal: Negative.   Skin: Negative.   Allergic/Immunologic: Negative.   Neurological: Negative.   Hematological: Negative.   Psychiatric/Behavioral: Negative.    All other systems reviewed and are negative.    Objective:    Physical Exam Vitals reviewed.  Constitutional:      Appearance: Normal appearance. He is obese.  HENT:     Head:     Comments: Systoliic murmur  2/6    Mouth/Throat:     Mouth: Mucous membranes are moist.  Eyes:     Pupils: Pupils are equal, round, and reactive to light.  Neck:     Vascular: No carotid bruit.  Cardiovascular:     Rate and Rhythm: Normal rate. Rhythm irregular.     Pulses: Normal pulses.     Heart sounds: Normal heart sounds.  Pulmonary:     Effort: Pulmonary effort is normal.     Breath sounds: Normal breath sounds.  Abdominal:     General: Bowel sounds are normal.     Palpations: Abdomen is soft. There is no hepatomegaly, splenomegaly or mass.     Tenderness: There is no abdominal tenderness.     Hernia: No hernia is present.  Musculoskeletal:     Cervical back: Neck supple.     Right lower leg: No edema.     Left lower leg: No edema.  Skin:    Findings: No rash.  Neurological:     Mental Status: He is alert and oriented to person, place, and time.     Motor: No weakness.  Psychiatric:        Mood and Affect: Mood normal.        Behavior: Behavior normal.    BP 140/80    Pulse 68    Ht _0  (1.854 m)    Wt 249 lb 1.6 oz (113 kg)    BMI 32.86 kg/m  Wt Readings from Last 3 Encounters:  01/13/22 249 lb 1.6 oz (113 kg)  10/15/21 241 lb 3.2 oz (109.4 kg)  08/27/21 236 lb (107 kg)      Health Maintenance Due  Topic Date Due   COVID-19 Vaccine (1) Never done   Zoster Vaccines- Shingrix (1 of 2) Never done   Pneumonia Vaccine 73+ Years old (1 - PCV) Never done    There are no preventive care reminders to display for  this patient.  No results found for: TSH Lab Results  Component Value Date   WBC 5.4 08/19/2021   HGB 12.3 (L) 08/19/2021   HCT 36.6 (L) 08/19/2021   MCV 100.8 (H) 08/19/2021   PLT 170 08/19/2021   Lab Results  Component Value Date   NA 138 08/19/2021   K 3.6 08/19/2021   CO2 24 08/19/2021   GLUCOSE 109 (H) 08/19/2021   BUN 18 08/19/2021   CREATININE 1.35 (H) 08/19/2021   BILITOT 0.9 08/19/2021   ALKPHOS 70 05/09/2019   AST 21 08/19/2021   ALT 15 08/19/2021   PROT 6.8 08/19/2021   ALBUMIN 2.6 (L) 05/09/2019   CALCIUM 9.0 08/19/2021   ANIONGAP 9 02/04/2020   EGFR 53 (L) 08/19/2021   No results found for: CHOL No results found for: HDL No results found for: [facility] Lab Results  Component Value Date   TRIG 106 05/09/2019   No results found for: CHOLHDL No results found for: HGBA1C    Assessment & Plan:   Problem List Items Addressed This Visit       Cardiovascular and Mediastinum   Hypertension - Primary     Patient denies any chest pain or shortness of breath there is no history of palpitation or paroxysmal nocturnal dyspnea   patient was advised to follow low-salt low-cholesterol diet    ideally I want to keep systolic blood pressure below 130 mmHg, patient was asked to check blood pressure one times a week and give me a report on that.  Patient will be follow-up in 3 months  or earlier as needed, patient will call me back for any change in the cardiovascular symptoms Patient was advised to buy a book from local bookstore concerning blood pressure and read several chapters  every day.  This will be supplemented by some of the material we will give him from the office.  Patient should also utilize other resources like  YouTube and Internet to learn more about the blood pressure and the diet.      Varicose veins of leg with swelling, left    Stable at the present time      Chronic atrial fibrillation (HCC)    Stable        Other   Obesity (BMI 30-39.9)    - I encouraged the patient to lose weight.  - I educated them on making healthy dietary choices including eating more fruits and vegetables and less fried foods. - I encouraged the patient to exercise more, and educated on the benefits of exercise including weight loss, diabetes prevention, and hypertension prevention.   Dietary counseling with a registered dietician  Referral to a weight management support group (e.g. Weight Watchers, Overeaters Anonymous)  If your BMI is greater than 29 or you have gained more than 15 pounds you should work on weight loss.  Attend a healthy cooking class        No orders of the defined types were placed in this encounter.   Follow-up: No follow-ups on file.    Cletis Athens, MD

## 2022-01-13 NOTE — Assessment & Plan Note (Signed)
Stable at the present time. 

## 2022-01-13 NOTE — Assessment & Plan Note (Signed)
Stable

## 2022-01-21 DIAGNOSIS — H2512 Age-related nuclear cataract, left eye: Secondary | ICD-10-CM | POA: Diagnosis not present

## 2022-01-28 ENCOUNTER — Encounter: Payer: Self-pay | Admitting: Ophthalmology

## 2022-02-03 NOTE — Discharge Instructions (Signed)

## 2022-02-05 ENCOUNTER — Encounter: Payer: Self-pay | Admitting: Ophthalmology

## 2022-02-05 ENCOUNTER — Ambulatory Visit: Payer: Medicare Other | Admitting: Anesthesiology

## 2022-02-05 ENCOUNTER — Ambulatory Visit
Admission: RE | Admit: 2022-02-05 | Discharge: 2022-02-05 | Disposition: A | Discharge status: Home / Self Care | Payer: Medicare Other | Source: Ambulatory Visit | Attending: Ophthalmology | Admitting: Ophthalmology

## 2022-02-05 ENCOUNTER — Encounter
Admission: RE | Disposition: A | Discharge status: Home / Self Care | Payer: Self-pay | Source: Ambulatory Visit | Attending: Ophthalmology

## 2022-02-05 ENCOUNTER — Other Ambulatory Visit: Payer: Self-pay

## 2022-02-05 DIAGNOSIS — E669 Obesity, unspecified: Secondary | ICD-10-CM | POA: Diagnosis not present

## 2022-02-05 DIAGNOSIS — I10 Essential (primary) hypertension: Secondary | ICD-10-CM | POA: Insufficient documentation

## 2022-02-05 DIAGNOSIS — Z7901 Long term (current) use of anticoagulants: Secondary | ICD-10-CM | POA: Insufficient documentation

## 2022-02-05 DIAGNOSIS — H2512 Age-related nuclear cataract, left eye: Secondary | ICD-10-CM | POA: Insufficient documentation

## 2022-02-05 DIAGNOSIS — Z6832 Body mass index (BMI) 32.0-32.9, adult: Secondary | ICD-10-CM | POA: Diagnosis not present

## 2022-02-05 DIAGNOSIS — H25812 Combined forms of age-related cataract, left eye: Secondary | ICD-10-CM | POA: Diagnosis not present

## 2022-02-05 DIAGNOSIS — I4891 Unspecified atrial fibrillation: Secondary | ICD-10-CM | POA: Diagnosis not present

## 2022-02-05 DIAGNOSIS — Z87891 Personal history of nicotine dependence: Secondary | ICD-10-CM | POA: Insufficient documentation

## 2022-02-05 HISTORY — DX: Other specified symptoms and signs involving the circulatory and respiratory systems: R09.89

## 2022-02-05 HISTORY — DX: Nonrheumatic aortic (valve) stenosis: I35.0

## 2022-02-05 HISTORY — DX: Unspecified atrial fibrillation: I48.91

## 2022-02-05 HISTORY — DX: Presence of dental prosthetic device (complete) (partial): Z97.2

## 2022-02-05 HISTORY — PX: CATARACT EXTRACTION W/PHACO: SHX586

## 2022-02-05 SURGERY — PHACOEMULSIFICATION, CATARACT, WITH IOL INSERTION
Anesthesia: Monitor Anesthesia Care | Site: Eye | Laterality: Left

## 2022-02-05 MED ORDER — SIGHTPATH DOSE#1 BSS IO SOLN
INTRAOCULAR | Status: DC | PRN
  Administered 2022-02-05: 1 mL via INTRAMUSCULAR

## 2022-02-05 MED ORDER — SIGHTPATH DOSE#1 NA HYALUR & NA CHOND-NA HYALUR IO KIT
PACK | INTRAOCULAR | Status: DC | PRN
  Administered 2022-02-05: 1 via OPHTHALMIC

## 2022-02-05 MED ORDER — LACTATED RINGERS IV SOLN: INTRAVENOUS | Status: DC

## 2022-02-05 MED ORDER — ARMC OPHTHALMIC DILATING DROPS
1.0000 "application " | OPHTHALMIC | Status: DC | PRN
  Administered 2022-02-05 (×3): 1 via OPHTHALMIC

## 2022-02-05 MED ORDER — SIGHTPATH DOSE#1 BSS IO SOLN
INTRAOCULAR | Status: DC | PRN
  Administered 2022-02-05: 15 mL

## 2022-02-05 MED ORDER — TETRACAINE HCL 0.5 % OP SOLN
1.0000 [drp] | OPHTHALMIC | Status: DC | PRN
  Administered 2022-02-05 (×3): 1 [drp] via OPHTHALMIC

## 2022-02-05 MED ORDER — MIDAZOLAM HCL 2 MG/2ML IJ SOLN
INTRAMUSCULAR | Status: DC | PRN
  Administered 2022-02-05: 1 mg via INTRAVENOUS

## 2022-02-05 MED ORDER — EPINEPHRINE PF 1 MG/ML IJ SOLN
INTRAMUSCULAR | Status: DC | PRN
  Administered 2022-02-05: 104 mL via OPHTHALMIC

## 2022-02-05 MED ORDER — FENTANYL CITRATE (PF) 100 MCG/2ML IJ SOLN
INTRAMUSCULAR | Status: DC | PRN
  Administered 2022-02-05 (×2): 50 ug via INTRAVENOUS

## 2022-02-05 MED ORDER — BRIMONIDINE TARTRATE-TIMOLOL 0.2-0.5 % OP SOLN
OPHTHALMIC | Status: DC | PRN
  Administered 2022-02-05: 1 [drp] via OPHTHALMIC

## 2022-02-05 MED ORDER — ONDANSETRON HCL 4 MG/2ML IJ SOLN: 4.0000 mg | Freq: Once | INTRAMUSCULAR | Status: DC | PRN

## 2022-02-05 MED ORDER — CEFUROXIME OPHTHALMIC INJECTION 1 MG/0.1 ML
INJECTION | OPHTHALMIC | Status: DC | PRN
  Administered 2022-02-05: 0.1 mL via INTRACAMERAL

## 2022-02-05 MED ORDER — ACETAMINOPHEN 10 MG/ML IV SOLN: 1000.0000 mg | Freq: Once | INTRAVENOUS | Status: DC | PRN

## 2022-02-05 SURGICAL SUPPLY — 12 items
CATARACT SUITE SIGHTPATH (MISCELLANEOUS) ×2 IMPLANT
FEE CATARACT SUITE SIGHTPATH (MISCELLANEOUS) ×1 IMPLANT
GLOVE SRG 8 PF TXTR STRL LF DI (GLOVE) ×1 IMPLANT
GLOVE SURG ENC TEXT LTX SZ7.5 (GLOVE) ×2 IMPLANT
GLOVE SURG UNDER POLY LF SZ8 (GLOVE) ×2
LENS IOL TECNIS EYHANCE 21.5 (Intraocular Lens) ×1 IMPLANT
NDL FILTER BLUNT 18X1 1/2 (NEEDLE) ×1 IMPLANT
NEEDLE FILTER BLUNT 18X 1/2SAF (NEEDLE) ×1
NEEDLE FILTER BLUNT 18X1 1/2 (NEEDLE) ×1 IMPLANT
RING MALYGIN 7.0 (MISCELLANEOUS) ×1 IMPLANT
SYR 3ML LL SCALE MARK (SYRINGE) ×2 IMPLANT
WATER STERILE IRR 250ML POUR (IV SOLUTION) ×2 IMPLANT

## 2022-02-05 NOTE — Transfer of Care (Signed)
Immediate Anesthesia Transfer of Care Note ? ?Patient: Mark Blevins ? ?Procedure(s) Performed: CATARACT EXTRACTION PHACO AND INTRAOCULAR LENS PLACEMENT (IOC) LEFT MALYUGIN 19.75 01:59.8 (Left: Eye) ? ?Patient Location: PACU ? ?Anesthesia Type: MAC ? ?Level of Consciousness: awake, alert  and patient cooperative ? ?Airway and Oxygen Therapy: Patient Spontanous Breathing and Patient connected to supplemental oxygen ? ?Post-op Assessment: Post-op Vital signs reviewed, Patient's Cardiovascular Status Stable, Respiratory Function Stable, Patent Airway and No signs of Nausea or vomiting ? ?Post-op Vital Signs: Reviewed and stable ? ?Complications: No notable events documented. ? ?

## 2022-02-05 NOTE — H&P (Signed)
?[facility]  ? ?Primary Care Physician:  Corky Downs, MD ?Ophthalmologist: Dr. Lockie Mola ? ?Pre-Procedure History & Physical: ?HPI:  Mark Blevins is a 81 y.o. male here for ophthalmic surgery. ?  ?Past Medical History:  ?Diagnosis Date  ? Aortic valve stenosis   ? moderate (TTE 2022)  ? Atrial fibrillation (HCC)   ? BPH (benign prostatic hyperplasia)   ? Hypertension   ? 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 Venous reflux   ? Wears dentures   ? Full upper and lower  ? ? ?Past Surgical History:  ?Procedure Laterality Date  ? APPENDECTOMY    ? BACK SURGERY    ? COLECTOMY N/A 04/27/2019  ? Procedure: COLECTOMY WITH COLOSTOMY;  Surgeon: Sung Amabile, DO;  Location: [facility];  Service: General;  Laterality: N/A;  ? COLONOSCOPY WITH PROPOFOL N/A 04/24/2019  ? Procedure: COLONOSCOPY WITH PROPOFOL;  Surgeon: Sung Amabile, DO;  Location: [facility];  Service: General;  Laterality: N/A;  ? LAPAROSCOPIC LYSIS OF ADHESIONS  05/06/2019  ? Procedure: LAPAROSCOPIC LYSIS OF ADHESIONS;  Surgeon: Sung Amabile, DO;  Location: [facility];  Service: General;;  ? LAPAROSCOPY N/A 05/06/2019  ? Procedure: LAPAROSCOPY DIAGNOSTIC;  Surgeon: Sung Amabile, DO;  Location: [facility];  Service: General;  Laterality: N/A;  ? REPLACEMENT TOTAL KNEE BILATERAL    ? XI ROBOTIC ASSISTED COLOSTOMY TAKEDOWN N/A 02/03/2020  ? Procedure: XI ROBOTIC ASSISTED COLOSTOMY TAKEDOWN;  Surgeon: Sung Amabile, DO;  Location: [facility];  Service: General;  Laterality: N/A;  ? ? ?Prior to Admission medications   ?Medication Sig Start Date End Date Taking? Authorizing Provider  ?amLODipine (NORVASC) 5 MG tablet Take 1 tablet (5 mg total) by mouth daily. 05/06/21  Yes Masoud, Renda Rolls, MD  ?apixaban (ELIQUIS) 5 MG TABS tablet Take 1 tablet (5 mg total) by mouth 2 (two) times daily. 04/22/21  Yes Masoud, Renda Rolls, MD  ?cloNIDine (CATAPRES) 0.1 MG tablet TAKE 1 TABLET BY MOUTH DAILY 01/09/22  Yes Masoud, Renda Rolls, MD  ?labetalol (NORMODYNE) 200 MG tablet TAKE  1 TABLET BY MOUTH DAILY 08/09/21  Yes Masoud, Renda Rolls, MD  ?Multiple Vitamin (MULTIVITAMIN WITH MINERALS) TABS tablet Take 1 tablet by mouth daily.   Yes [provider]  ?triamcinolone cream (KENALOG) 0.1 % Apply 1 application topically 2 (two) times daily. 04/22/21   Corky Downs, MD  ? ? ?Allergies as of 12/30/2021 - Review Complete 10/15/2021  ?Allergen Reaction Noted  ? Codeine Nausea And Vomiting 04/24/2019  ? Morphine and related Nausea And Vomiting 04/24/2019  ? ? ?History reviewed. No pertinent family history. ? ?Social History  ? ?Socioeconomic History  ? Marital status: Married  ?  Spouse name: Not on file  ? Number of children: Not on file  ? Years of education: Not on file  ? Highest education level: Not on file  ?Occupational History  ? Not on file  ?Tobacco Use  ? Smoking status: Former  ?  Types: Cigarettes  ?  Quit date: 50  ?  Years since quitting: 52.2  ? Smokeless tobacco: Current  ?  Types: Chew  ? Tobacco comments:  ?  quit smoking 1969 or 1970  ?Vaping Use  ? Vaping Use: Never used  ?Substance and Sexual Activity  ? Alcohol use: Yes  ?  Alcohol/week: 24.0 standard drinks  ?  Types: 24 Cans of beer per week  ?  Comment: 3-4 per day  ? Drug use: Never  ? Sexual activity: Yes  ?  Other Topics Concern  ? Not on file  ?Social History Narrative  ? Not on file  ? ?Social Determinants of Health  ? ?Financial Resource Strain: Low Risk   ? Difficulty of Paying Living Expenses: Not hard at all  ?Food Insecurity: No Food Insecurity  ? Worried About Programme researcher, broadcasting/film/video in the Last Year: Never true  ? Ran Out of Food in the Last Year: Never true  ?Transportation Needs: No Transportation Needs  ? Lack of Transportation (Medical): No  ? Lack of Transportation (Non-Medical): No  ?Physical Activity: Insufficiently Active  ? Days of Exercise per Week: 2 days  ? Minutes of Exercise per Session: 20 min  ?Stress: No Stress Concern Present  ? Feeling of Stress : Not at all  ?Social Connections: Socially  Integrated  ? Frequency of Communication with Friends and Family: More than three times a week  ? Frequency of Social Gatherings with Friends and Family: More than three times a week  ? Attends Religious Services: More than 4 times per year  ? Active Member of Clubs or Organizations: Yes  ? Attends Banker Meetings: More than 4 times per year  ? Marital Status: Married  ?Intimate Partner Violence: Not At Risk  ? Fear of Current or Ex-Partner: No  ? Emotionally Abused: No  ? Physically Abused: No  ? Sexually Abused: No  ? ? ?Review of Systems: ?See HPI, otherwise negative ROS ? ?Physical Exam: ?BP 138/83   Pulse 75   Temp 97.7 ?F (36.5 ?C) (Temporal)   Resp 18   Ht 6\' 1"  (1.854 m)   Wt 108.9 kg   SpO2 100%   BMI 31.66 kg/m?  ?General:   Alert,  pleasant and cooperative in NAD ?Head:  Normocephalic and atraumatic. ?Lungs:  Clear to auscultation.    ?Heart:  Regular rate and rhythm.  ? ?Impression/Plan: ?Mark Blevins is here for ophthalmic surgery. ? ?Risks, benefits, limitations, and alternatives regarding ophthalmic surgery have been reviewed with the patient.  Questions have been answered.  All parties agreeable. ? ? [name], MD  02/05/2022, 10:08 AM ? ? ?

## 2022-02-05 NOTE — Op Note (Signed)
OPERATIVE NOTE ? ?KALAN YELEY ?[id number] ?02/05/2022 ? ?PREOPERATIVE DIAGNOSIS:   Nuclear sclerotic cataract left eye with miotic pupil      H25.12 ?  ?POSTOPERATIVE DIAGNOSIS:   Nuclear sclerotic cataract left eye with miotic pupil.   ?  ?PROCEDURE:  Phacoemulsification with posterior chamber intraocular lens implantation of the left eye which required pupil stretching with the Malyugin pupil expansion device ? Ultrasound time: Procedure(s): ?CATARACT EXTRACTION PHACO AND INTRAOCULAR LENS PLACEMENT (IOC) LEFT MALYUGIN 19.75 01:59.8 (Left) ? ?LENS:   ?Implant Name Type Inv. Item Serial No. Manufacturer Lot No. LRB No. Used Action  ?LENS IOL TECNIS EYHANCE 21.5 - [id number] Intraocular Lens LENS IOL TECNIS EYHANCE 21.5 [id number] [employer]  Left 1 Implanted  ?    ?  ?SURGEON:  Deirdre Evener, MD ?  ?ANESTHESIA: Topical with tetracaine drops and 2% Xylocaine jelly, augmented with 1% preservative-free intracameral lidocaine. ?  ?COMPLICATIONS:  None. ?  ?DESCRIPTION OF PROCEDURE:  The patient was identified in the holding room and transported to the operating room and placed in the supine position under the operating microscope.  The left eye was identified as the operative eye and it was prepped and draped in the usual sterile ophthalmic fashion. ?  ?A 1 millimeter clear-corneal paracentesis was made at the 1:30 position.  The anterior chamber was filled with Viscoat viscoelastic.  0.5 ml of preservative-free 1% lidocaine was injected into the anterior chamber.  A 2.4 millimeter keratome was used to make a near-clear corneal incision at the 10:30 position.  A Malyugin pupil expander was then placed through the main incision and into the anterior chamber of the eye.  The edge of the iris was secured on the lip of the pupil expander and it was released, thereby expanding the pupil to approximately 7 millimeters for completion of the cataract surgery.  Additional Viscoat was placed in the anterior chamber.  A  cystotome and capsulorrhexis forceps were used to make a curvilinear capsulorrhexis.   Balanced salt solution was used to hydrodissect and hydrodelineate the lens nucleus. ?  ?Phacoemulsification was used in stop and chop fashion to remove the lens, nucleus and epinucleus.  The remaining cortex was aspirated using the irrigation aspiration handpiece.  Additional Provisc was placed into the eye to distend the capsular bag for lens placement.  A lens was then injected into the capsular bag.  The pupil expanding ring was removed using a Kuglen hook and insertion device. The remaining viscoelastic was aspirated from the capsular bag and the anterior chamber.  The anterior chamber was filled with balanced salt solution to inflate to a physiologic pressure.  ? ?Wounds were hydrated with balanced salt solution.  The anterior chamber was inflated to a physiologic pressure with balanced salt solution.  No wound leaks were noted. Cefuroxime 0.1 ml of a 10mg /ml solution was injected into the anterior chamber for a dose of 1 mg of intracameral antibiotic at the completion of the case. ?  Timolol and Brimonidine drops were applied to the eye.  The patient was taken to the recovery room in stable condition without complications of anesthesia or surgery. ? ?Adell Koval ?02/05/2022, 11:04 AM ? ?

## 2022-02-05 NOTE — Anesthesia Procedure Notes (Signed)
Procedure Name: [place] ?Date/Time: 02/05/2022 10:43 AM ?Performed by: Jeannene Patella, CRNA ?Pre-anesthesia Checklist: Patient identified, Emergency Drugs available, Suction available, Timeout performed and Patient being monitored ?Patient Re-evaluated:Patient Re-evaluated prior to induction ?Oxygen Delivery Method: Nasal cannula ?Placement Confirmation: positive ETCO2 ? ? ? ? ?

## 2022-02-05 NOTE — Anesthesia Postprocedure Evaluation (Signed)
Anesthesia Post Note ? ?Patient: Mark Blevins ? ?Procedure(s) Performed: CATARACT EXTRACTION PHACO AND INTRAOCULAR LENS PLACEMENT (IOC) LEFT MALYUGIN 19.75 01:59.8 (Left: Eye) ? ? ?  ?Patient location during evaluation: PACU ?Anesthesia Type: MAC ?Level of consciousness: awake and alert ?Pain management: pain level controlled ?Vital Signs Assessment: post-procedure vital signs reviewed and stable ?Respiratory status: spontaneous breathing, nonlabored ventilation, respiratory function stable and patient connected to nasal cannula oxygen ?Cardiovascular status: stable and blood pressure returned to baseline ?Postop Assessment: no apparent nausea or vomiting ?Anesthetic complications: no ? ? ?No notable events documented. ? ?Keldrick Pomplun A  Sadie Pickar ? ? ? ? ? ?

## 2022-02-05 NOTE — Anesthesia Preprocedure Evaluation (Signed)
Anesthesia Evaluation  ?Patient identified by MRN, date of birth, ID band ?Patient awake ? ? ? ?Reviewed: ?Allergy & Precautions, NPO status , Patient's Chart, lab work & pertinent test results, reviewed documented beta blocker date and time  ? ?History of Anesthesia Complications ?Negative for: history of anesthetic complications ? ?Airway ?Mallampati: III ? ?TM Distance: >3 FB ?Neck ROM: Limited ? ? ? Dental ? ?(+) Upper Dentures, Lower Dentures ?  ?Pulmonary ?former smoker,  ?  ?breath sounds clear to auscultation ? ? ? ? ? ? Cardiovascular ?hypertension, (-) angina(-) DOE + dysrhythmias (on Eliquis) Atrial Fibrillation + Valvular Problems/Murmurs (AS, 3.52 m/s peak velocity) AS  ?Rhythm:Regular Rate:Normal ? ? ?  ?Neuro/Psych ?  ? GI/Hepatic ?neg GERD  ,  ?Endo/Other  ? ? Renal/GU ?  ? ?  ?Musculoskeletal ? ? Abdominal ?(+) + obese (BMI 32),   ?Peds ? Hematology ?  ?Anesthesia Other Findings ? ? Reproductive/Obstetrics ? ?  ? ? ? ? ? ? ? ? ? ? ? ? ? ?  ?  ? ? ? ? ? ? ? ? ?Anesthesia Physical ?Anesthesia Plan ? ?ASA: 3 ? ?Anesthesia Plan: MAC  ? ?Post-op Pain Management:   ? ?Induction: Intravenous ? ?PONV Risk Score and Plan: 1 and TIVA, Midazolam and Treatment may vary due to age or medical condition ? ?Airway Management Planned: Nasal Cannula ? ?Additional Equipment:  ? ?Intra-op Plan:  ? ?Post-operative Plan:  ? ?Informed Consent: I have reviewed the patients History and Physical, chart, labs and discussed the procedure including the risks, benefits and alternatives for the proposed anesthesia with the patient or authorized representative who has indicated his/her understanding and acceptance.  ? ? ? ? ? ?Plan Discussed with: CRNA and Anesthesiologist ? ?Anesthesia Plan Comments:   ? ? ? ? ? ? ?Anesthesia Quick Evaluation ? ?

## 2022-02-06 ENCOUNTER — Encounter: Payer: Self-pay | Admitting: Ophthalmology

## 2022-02-06 ENCOUNTER — Other Ambulatory Visit: Payer: Self-pay

## 2022-02-11 DIAGNOSIS — H2511 Age-related nuclear cataract, right eye: Secondary | ICD-10-CM | POA: Diagnosis not present

## 2022-02-17 NOTE — Discharge Instructions (Signed)

## 2022-02-19 ENCOUNTER — Other Ambulatory Visit: Payer: Self-pay

## 2022-02-19 ENCOUNTER — Ambulatory Visit: Payer: Medicare Other | Admitting: Anesthesiology

## 2022-02-19 ENCOUNTER — Ambulatory Visit
Admission: RE | Admit: 2022-02-19 | Discharge: 2022-02-19 | Disposition: A | Discharge status: Home / Self Care | Payer: Medicare Other | Attending: Ophthalmology | Admitting: Ophthalmology

## 2022-02-19 ENCOUNTER — Encounter: Payer: Self-pay | Admitting: Ophthalmology

## 2022-02-19 ENCOUNTER — Encounter
Admission: RE | Disposition: A | Discharge status: Home / Self Care | Payer: Self-pay | Source: Home / Self Care | Attending: Ophthalmology

## 2022-02-19 DIAGNOSIS — H2511 Age-related nuclear cataract, right eye: Secondary | ICD-10-CM | POA: Diagnosis not present

## 2022-02-19 DIAGNOSIS — I10 Essential (primary) hypertension: Secondary | ICD-10-CM | POA: Insufficient documentation

## 2022-02-19 DIAGNOSIS — Z7901 Long term (current) use of anticoagulants: Secondary | ICD-10-CM | POA: Diagnosis not present

## 2022-02-19 DIAGNOSIS — I4891 Unspecified atrial fibrillation: Secondary | ICD-10-CM | POA: Diagnosis not present

## 2022-02-19 DIAGNOSIS — Z87891 Personal history of nicotine dependence: Secondary | ICD-10-CM | POA: Insufficient documentation

## 2022-02-19 DIAGNOSIS — H25811 Combined forms of age-related cataract, right eye: Secondary | ICD-10-CM | POA: Diagnosis not present

## 2022-02-19 DIAGNOSIS — Z6831 Body mass index (BMI) 31.0-31.9, adult: Secondary | ICD-10-CM | POA: Diagnosis not present

## 2022-02-19 DIAGNOSIS — E669 Obesity, unspecified: Secondary | ICD-10-CM | POA: Insufficient documentation

## 2022-02-19 DIAGNOSIS — H2512 Age-related nuclear cataract, left eye: Secondary | ICD-10-CM | POA: Diagnosis not present

## 2022-02-19 HISTORY — PX: CATARACT EXTRACTION W/PHACO: SHX586

## 2022-02-19 SURGERY — PHACOEMULSIFICATION, CATARACT, WITH IOL INSERTION
Anesthesia: Monitor Anesthesia Care | Site: Eye | Laterality: Right

## 2022-02-19 MED ORDER — BRIMONIDINE TARTRATE-TIMOLOL 0.2-0.5 % OP SOLN
OPHTHALMIC | Status: DC | PRN
Start: 2022-02-19 — End: 2022-02-19
  Administered 2022-02-19: 1 [drp] via OPHTHALMIC

## 2022-02-19 MED ORDER — MIDAZOLAM HCL 2 MG/2ML IJ SOLN
INTRAMUSCULAR | Status: DC | PRN
  Administered 2022-02-19: 1 mg via INTRAVENOUS

## 2022-02-19 MED ORDER — CEFUROXIME OPHTHALMIC INJECTION 1 MG/0.1 ML
INJECTION | OPHTHALMIC | Status: DC | PRN
  Administered 2022-02-19: 0.1 mL via INTRACAMERAL

## 2022-02-19 MED ORDER — ACETAMINOPHEN 160 MG/5ML PO SOLN: 325.0000 mg | Freq: Once | ORAL | Status: DC

## 2022-02-19 MED ORDER — SIGHTPATH DOSE#1 BSS IO SOLN
INTRAOCULAR | Status: DC | PRN
  Administered 2022-02-19: 96 mL via OPHTHALMIC

## 2022-02-19 MED ORDER — SIGHTPATH DOSE#1 BSS IO SOLN
INTRAOCULAR | Status: DC | PRN
  Administered 2022-02-19: 1 mL via INTRAMUSCULAR

## 2022-02-19 MED ORDER — SIGHTPATH DOSE#1 NA HYALUR & NA CHOND-NA HYALUR IO KIT
PACK | INTRAOCULAR | Status: DC | PRN
  Administered 2022-02-19: 1 via OPHTHALMIC

## 2022-02-19 MED ORDER — SIGHTPATH DOSE#1 BSS IO SOLN
INTRAOCULAR | Status: DC | PRN
  Administered 2022-02-19: 15 mL

## 2022-02-19 MED ORDER — ARMC OPHTHALMIC DILATING DROPS
1.0000 "application " | OPHTHALMIC | Status: DC | PRN
  Administered 2022-02-19 (×3): 1 via OPHTHALMIC

## 2022-02-19 MED ORDER — LACTATED RINGERS IV SOLN: INTRAVENOUS | Status: DC

## 2022-02-19 MED ORDER — TETRACAINE HCL 0.5 % OP SOLN
1.0000 [drp] | OPHTHALMIC | Status: DC | PRN
  Administered 2022-02-19 (×3): 1 [drp] via OPHTHALMIC

## 2022-02-19 MED ORDER — FENTANYL CITRATE (PF) 100 MCG/2ML IJ SOLN
INTRAMUSCULAR | Status: DC | PRN
  Administered 2022-02-19: 100 ug via INTRAVENOUS

## 2022-02-19 MED ORDER — ACETAMINOPHEN 325 MG PO TABS: 325.0000 mg | ORAL_TABLET | Freq: Once | ORAL | Status: DC

## 2022-02-19 SURGICAL SUPPLY — 13 items
CATARACT SUITE SIGHTPATH (MISCELLANEOUS) ×2 IMPLANT
FEE CATARACT SUITE SIGHTPATH (MISCELLANEOUS) ×1 IMPLANT
GLOVE SRG 8 PF TXTR STRL LF DI (GLOVE) ×1 IMPLANT
GLOVE SURG ENC TEXT LTX SZ7.5 (GLOVE) ×2 IMPLANT
GLOVE SURG UNDER POLY LF SZ8 (GLOVE) ×2
LENS IOL TECNIS EYHANCE 21.0 (Intraocular Lens) ×1 IMPLANT
NDL FILTER BLUNT 18X1 1/2 (NEEDLE) ×1 IMPLANT
NEEDLE FILTER BLUNT 18X 1/2SAF (NEEDLE) ×1
NEEDLE FILTER BLUNT 18X1 1/2 (NEEDLE) ×1 IMPLANT
RING MALYGIN 7.0 (MISCELLANEOUS) ×1 IMPLANT
SYR 3ML LL SCALE MARK (SYRINGE) ×2 IMPLANT
WATER STERILE IRR 250ML POUR (IV SOLUTION) ×2 IMPLANT
WICK EYE OCUCEL (MISCELLANEOUS) ×1 IMPLANT

## 2022-02-19 NOTE — Anesthesia Postprocedure Evaluation (Signed)
Anesthesia Post Note ? ?Patient: Mark Blevins ? ?Procedure(s) Performed: CATARACT EXTRACTION PHACO AND INTRAOCULAR LENS PLACEMENT (IOC) RIGHT MALYUGIN 1900 01:55.6 (Right: Eye) ? ? ?  ?Patient location during evaluation: PACU ?Anesthesia Type: MAC ?Level of consciousness: awake and alert and oriented ?Pain management: satisfactory to patient ?Vital Signs Assessment: post-procedure vital signs reviewed and stable ?Respiratory status: spontaneous breathing, nonlabored ventilation and respiratory function stable ?Cardiovascular status: blood pressure returned to baseline and stable ?Postop Assessment: Adequate PO intake and No signs of nausea or vomiting ?Anesthetic complications: no ? ? ?No notable events documented. ? ?Raliegh Ip ? ? ? ? ? ?

## 2022-02-19 NOTE — Transfer of Care (Signed)
Immediate Anesthesia Transfer of Care Note ? ?Patient: Mark Blevins ? ?Procedure(s) Performed: CATARACT EXTRACTION PHACO AND INTRAOCULAR LENS PLACEMENT (IOC) RIGHT MALYUGIN 1900 01:55.6 (Right: Eye) ? ?Patient Location: PACU ? ?Anesthesia Type: MAC ? ?Level of Consciousness: awake, alert  and patient cooperative ? ?Airway and Oxygen Therapy: Patient Spontanous Breathing and Patient connected to supplemental oxygen ? ?Post-op Assessment: Post-op Vital signs reviewed, Patient's Cardiovascular Status Stable, Respiratory Function Stable, Patent Airway and No signs of Nausea or vomiting ? ?Post-op Vital Signs: Reviewed and stable ? ?Complications: No notable events documented. ? ?

## 2022-02-19 NOTE — Anesthesia Preprocedure Evaluation (Signed)
Anesthesia Evaluation  ?Patient identified by MRN, date of birth, ID band ?Patient awake ? ? ? ?Reviewed: ?Allergy & Precautions, NPO status , Patient's Chart, lab work & pertinent test results, reviewed documented beta blocker date and time  ? ?History of Anesthesia Complications ?Negative for: history of anesthetic complications ? ?Airway ?Mallampati: II ? ?TM Distance: >3 FB ?Neck ROM: Limited ? ? ? Dental ? ?(+) Upper Dentures, Lower Dentures ?  ?Pulmonary ?former smoker,  ?  ?breath sounds clear to auscultation ? ? ? ? ? ? Cardiovascular ?hypertension, (-) angina(-) DOE + dysrhythmias (on Eliquis) Atrial Fibrillation + Valvular Problems/Murmurs (AS, 3.52 m/s peak velocity) AS  ?Rhythm:Regular Rate:Normal ? ? ?  ?Neuro/Psych ?  ? GI/Hepatic ?neg GERD  ,  ?Endo/Other  ? ? Renal/GU ?  ? ?  ?Musculoskeletal ? ? Abdominal ?(+) + obese (BMI 32),   ?Peds ? Hematology ?  ?Anesthesia Other Findings ? ? Reproductive/Obstetrics ? ?  ? ? ? ? ? ? ? ? ? ? ? ? ? ?  ?  ? ? ? ? ? ? ? ? ?Anesthesia Physical ? ?Anesthesia Plan ? ?ASA: 3 ? ?Anesthesia Plan: MAC  ? ?Post-op Pain Management:   ? ?Induction: Intravenous ? ?PONV Risk Score and Plan: 1 and TIVA, Midazolam and Treatment may vary due to age or medical condition ? ?Airway Management Planned: Nasal Cannula ? ?Additional Equipment:  ? ?Intra-op Plan:  ? ?Post-operative Plan:  ? ?Informed Consent: I have reviewed the patients History and Physical, chart, labs and discussed the procedure including the risks, benefits and alternatives for the proposed anesthesia with the patient or authorized representative who has indicated his/her understanding and acceptance.  ? ? ? ? ? ?Plan Discussed with: CRNA and Anesthesiologist ? ?Anesthesia Plan Comments:   ? ? ? ? ? ? ?Anesthesia Quick Evaluation ? ?

## 2022-02-19 NOTE — H&P (Signed)
[facility]  ? ?Primary Care Physician:  Corky Downs, MD ?Ophthalmologist: Dr. Lockie Mola ? ?Pre-Procedure History & Physical: ?HPI:  Mark Blevins is a 81 y.o. male here for ophthalmic surgery. ?  ?Past Medical History:  ?Diagnosis Date  ? Aortic valve stenosis   ? moderate (TTE 2022)  ? Atrial fibrillation (HCC)   ? BPH (benign prostatic hyperplasia)   ? Hypertension   ? 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 Venous reflux   ? Wears dentures   ? Full upper and lower  ? ? ?Past Surgical History:  ?Procedure Laterality Date  ? APPENDECTOMY    ? BACK SURGERY    ? CATARACT EXTRACTION W/PHACO Left 02/05/2022  ? Procedure: CATARACT EXTRACTION PHACO AND INTRAOCULAR LENS PLACEMENT (IOC) LEFT MALYUGIN 19.75 01:59.8;  Surgeon: Lockie Mola, MD;  Location: [facility];  Service: Ophthalmology;  Laterality: Left;  ? COLECTOMY N/A 04/27/2019  ? Procedure: COLECTOMY WITH COLOSTOMY;  Surgeon: Sung Amabile, DO;  Location: [facility];  Service: General;  Laterality: N/A;  ? COLONOSCOPY WITH PROPOFOL N/A 04/24/2019  ? Procedure: COLONOSCOPY WITH PROPOFOL;  Surgeon: Sung Amabile, DO;  Location: [facility];  Service: General;  Laterality: N/A;  ? LAPAROSCOPIC LYSIS OF ADHESIONS  05/06/2019  ? Procedure: LAPAROSCOPIC LYSIS OF ADHESIONS;  Surgeon: Sung Amabile, DO;  Location: [facility];  Service: General;;  ? LAPAROSCOPY N/A 05/06/2019  ? Procedure: LAPAROSCOPY DIAGNOSTIC;  Surgeon: Sung Amabile, DO;  Location: [facility];  Service: General;  Laterality: N/A;  ? REPLACEMENT TOTAL KNEE BILATERAL    ? XI ROBOTIC ASSISTED COLOSTOMY TAKEDOWN N/A 02/03/2020  ? Procedure: XI ROBOTIC ASSISTED COLOSTOMY TAKEDOWN;  Surgeon: Sung Amabile, DO;  Location: [facility];  Service: General;  Laterality: N/A;  ? ? ?Prior to Admission medications   ?Medication Sig Start Date End Date Taking? Authorizing Provider  ?amLODipine (NORVASC) 5 MG tablet Take 1 tablet (5 mg total) by mouth daily. 05/06/21  Yes Masoud, Renda Rolls, MD   ?apixaban (ELIQUIS) 5 MG TABS tablet Take 1 tablet (5 mg total) by mouth 2 (two) times daily. 04/22/21  Yes Masoud, Renda Rolls, MD  ?cloNIDine (CATAPRES) 0.1 MG tablet TAKE 1 TABLET BY MOUTH DAILY 01/09/22  Yes Masoud, Renda Rolls, MD  ?labetalol (NORMODYNE) 200 MG tablet TAKE 1 TABLET BY MOUTH DAILY 08/09/21  Yes Masoud, Renda Rolls, MD  ?Multiple Vitamin (MULTIVITAMIN WITH MINERALS) TABS tablet Take 1 tablet by mouth daily.   Yes [provider]  ?triamcinolone cream (KENALOG) 0.1 % Apply 1 application topically 2 (two) times daily. 04/22/21  Yes Corky Downs, MD  ? ? ?Allergies as of 12/30/2021 - Review Complete 10/15/2021  ?Allergen Reaction Noted  ? Codeine Nausea And Vomiting 04/24/2019  ? Morphine and related Nausea And Vomiting 04/24/2019  ? ? ?History reviewed. No pertinent family history. ? ?Social History  ? ?Socioeconomic History  ? Marital status: Married  ?  Spouse name: Not on file  ? Number of children: Not on file  ? Years of education: Not on file  ? Highest education level: Not on file  ?Occupational History  ? Not on file  ?Tobacco Use  ? Smoking status: Former  ?  Types: Cigarettes  ?  Quit date: 52  ?  Years since quitting: 52.3  ? Smokeless tobacco: Current  ?  Types: Chew  ? Tobacco comments:  ?  quit smoking 1969 or 1970  ?Vaping Use  ? Vaping Use: Never used  ?Substance and Sexual Activity  ?  Alcohol use: Yes  ?  Alcohol/week: 24.0 standard drinks  ?  Types: 24 Cans of beer per week  ?  Comment: 3-4 per day  ? Drug use: Never  ? Sexual activity: Yes  ?Other Topics Concern  ? Not on file  ?Social History Narrative  ? Not on file  ? ?Social Determinants of Health  ? ?Financial Resource Strain: Low Risk   ? Difficulty of Paying Living Expenses: Not hard at all  ?Food Insecurity: No Food Insecurity  ? Worried About Programme researcher, broadcasting/film/video in the Last Year: Never true  ? Ran Out of Food in the Last Year: Never true  ?Transportation Needs: No Transportation Needs  ? Lack of Transportation (Medical): No  ?  Lack of Transportation (Non-Medical): No  ?Physical Activity: Insufficiently Active  ? Days of Exercise per Week: 2 days  ? Minutes of Exercise per Session: 20 min  ?Stress: No Stress Concern Present  ? Feeling of Stress : Not at all  ?Social Connections: Socially Integrated  ? Frequency of Communication with Friends and Family: More than three times a week  ? Frequency of Social Gatherings with Friends and Family: More than three times a week  ? Attends Religious Services: More than 4 times per year  ? Active Member of Clubs or Organizations: Yes  ? Attends Banker Meetings: More than 4 times per year  ? Marital Status: Married  ?Intimate Partner Violence: Not At Risk  ? Fear of Current or Ex-Partner: No  ? Emotionally Abused: No  ? Physically Abused: No  ? Sexually Abused: No  ? ? ?Review of Systems: ?See HPI, otherwise negative ROS ? ?Physical Exam: ?BP (!) 142/83   Pulse 64   Temp 97.7 ?F (36.5 ?C) (Temporal)   Resp 12   Ht 6\' 1"  (1.854 m)   Wt 108.2 kg   SpO2 100%   BMI 31.47 kg/m?  ?General:   Alert,  pleasant and cooperative in NAD ?Head:  Normocephalic and atraumatic. ?Lungs:  Clear to auscultation.    ?Heart:  Regular rate and rhythm.  ? ?Impression/Plan: ?Mark Blevins is here for ophthalmic surgery. ? ?Risks, benefits, limitations, and alternatives regarding ophthalmic surgery have been reviewed with the patient.  Questions have been answered.  All parties agreeable. ? ? [name], MD  02/19/2022, 7:35 AM ? ? ?

## 2022-02-19 NOTE — Op Note (Signed)
OPERATIVE NOTE ? ?Mark Blevins ?[id number] ?02/19/2022 ? ?PREOPERATIVE DIAGNOSIS:   Nuclear sclerotic cataract left eye with miotic pupil      H25.12 ?  ?POSTOPERATIVE DIAGNOSIS:   Nuclear sclerotic cataract left eye with miotic pupil.   ?  ?PROCEDURE:  Phacoemulsification with posterior chamber intraocular lens implantation of the left eye which required pupil stretching with the Malyugin pupil expansion device ? Ultrasound time: Procedure(s): ?CATARACT EXTRACTION PHACO AND INTRAOCULAR LENS PLACEMENT (IOC) RIGHT MALYUGIN 1900 01:55.6 (Right) ? ?LENS:   ?Implant Name Type Inv. Item Serial No. Manufacturer Lot No. LRB No. Used Action  ?LENS IOL TECNIS EYHANCE 21.0 - [id number] Intraocular Lens LENS IOL TECNIS EYHANCE 21.0 [id number] [employer]  Right 1 Implanted  ?    ?  ?  ?SURGEON:  Deirdre Evener, MD ?  ?ANESTHESIA: Topical with tetracaine drops and 2% Xylocaine jelly, augmented with 1% preservative-free intracameral lidocaine. ?  ?COMPLICATIONS:  None. ?  ?DESCRIPTION OF PROCEDURE:  The patient was identified in the holding room and transported to the operating room and placed in the supine position under the operating microscope.  The left eye was identified as the operative eye and it was prepped and draped in the usual sterile ophthalmic fashion. ?  ?A 1 millimeter clear-corneal paracentesis was made at the 1:30 position.  The anterior chamber was filled with Viscoat viscoelastic.  0.5 ml of preservative-free 1% lidocaine was injected into the anterior chamber.  A 2.4 millimeter keratome was used to make a near-clear corneal incision at the 10:30 position.  A Malyugin pupil expander was then placed through the main incision and into the anterior chamber of the eye.  The edge of the iris was secured on the lip of the pupil expander and it was released, thereby expanding the pupil to approximately 7 millimeters for completion of the cataract surgery.  Additional Viscoat was placed in the anterior chamber.   A cystotome and capsulorrhexis forceps were used to make a curvilinear capsulorrhexis.   Balanced salt solution was used to hydrodissect and hydrodelineate the lens nucleus. ?  ?Phacoemulsification was used in stop and chop fashion to remove the lens, nucleus and epinucleus.  The remaining cortex was aspirated using the irrigation aspiration handpiece.  Additional Provisc was placed into the eye to distend the capsular bag for lens placement.  A lens was then injected into the capsular bag.  The pupil expanding ring was removed using a Kuglen hook and insertion device. The remaining viscoelastic was aspirated from the capsular bag and the anterior chamber.  The anterior chamber was filled with balanced salt solution to inflate to a physiologic pressure.  ? ?Wounds were hydrated with balanced salt solution.  The anterior chamber was inflated to a physiologic pressure with balanced salt solution.  No wound leaks were noted. Cefuroxime 0.1 ml of a 10mg /ml solution was injected into the anterior chamber for a dose of 1 mg of intracameral antibiotic at the completion of the case. ?  Timolol and Brimonidine drops were applied to the eye.  The patient was taken to the recovery room in stable condition without complications of anesthesia or surgery. ? ?Mark Blevins ?02/19/2022, 8:35 AM ? ?

## 2022-02-20 ENCOUNTER — Encounter: Payer: Self-pay | Admitting: Ophthalmology

## 2022-04-14 ENCOUNTER — Encounter: Payer: Self-pay | Admitting: Internal Medicine

## 2022-04-14 ENCOUNTER — Ambulatory Visit (INDEPENDENT_AMBULATORY_CARE_PROVIDER_SITE_OTHER): Payer: Medicare Other | Admitting: Internal Medicine

## 2022-04-14 VITALS — BP 114/79 | HR 76 | Ht 73.0 in | Wt 239.7 lb

## 2022-04-14 DIAGNOSIS — I10 Essential (primary) hypertension: Secondary | ICD-10-CM

## 2022-04-14 DIAGNOSIS — E669 Obesity, unspecified: Secondary | ICD-10-CM | POA: Diagnosis not present

## 2022-04-14 DIAGNOSIS — I482 Chronic atrial fibrillation, unspecified: Secondary | ICD-10-CM

## 2022-04-14 DIAGNOSIS — I83892 Varicose veins of left lower extremities with other complications: Secondary | ICD-10-CM | POA: Diagnosis not present

## 2022-04-14 MED ORDER — TRIAMCINOLONE ACETONIDE 0.1 % EX CREA: 1.0000 "application " | TOPICAL_CREAM | Freq: Two times a day (BID) | CUTANEOUS | 6 refills | Status: DC

## 2022-04-14 MED ORDER — APIXABAN 5 MG PO TABS: 5.0000 mg | ORAL_TABLET | Freq: Two times a day (BID) | ORAL | 3 refills | Status: DC

## 2022-04-14 MED ORDER — AMLODIPINE BESYLATE 5 MG PO TABS: 5.0000 mg | ORAL_TABLET | Freq: Every day | ORAL | 3 refills | Status: DC

## 2022-04-14 NOTE — Assessment & Plan Note (Signed)
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

## 2022-04-14 NOTE — Assessment & Plan Note (Signed)

## 2022-04-14 NOTE — Assessment & Plan Note (Signed)
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

## 2022-04-14 NOTE — Assessment & Plan Note (Signed)
Atrial fibrillation is under control, patient denies any chest pain, he does not smoke or drink.

## 2022-04-14 NOTE — Progress Notes (Signed)
General checkup.  Established Patient Office Visit  Subjective:  Patient ID: Mark Blevins, male    DOB: [birth year]  Age: 81 y.o. MRN: [id number]  CC:  Chief Complaint  Patient presents with   Follow-up    HPI  Mark Blevins presents for  General checkup.  He denies any chest pain or shortness of breath.  He does not smoke does not drink.  Patient has a chronic atrial fibrillation and is on anticoagulant for that Past Medical History:  Diagnosis Date   Aortic valve stenosis    moderate (TTE 2022)   Atrial fibrillation (HCC)    BPH (benign prostatic hyperplasia)    Hypertension    Poor circulation of extremity    left leg   Venous reflux    Wears dentures    Full upper and lower    Past Surgical History:  Procedure Laterality Date   APPENDECTOMY     BACK SURGERY     CATARACT EXTRACTION W/PHACO Left 02/05/2022   Procedure: CATARACT EXTRACTION PHACO AND INTRAOCULAR LENS PLACEMENT (IOC) LEFT MALYUGIN 19.75 01:59.8;  Surgeon: Lockie Mola, MD;  Location: [facility];  Service: Ophthalmology;  Laterality: Left;   CATARACT EXTRACTION W/PHACO Right 02/19/2022   Procedure: CATARACT EXTRACTION PHACO AND INTRAOCULAR LENS PLACEMENT (IOC) RIGHT MALYUGIN 1900 01:55.6;  Surgeon: Lockie Mola, MD;  Location: [facility];  Service: Ophthalmology;  Laterality: Right;   COLECTOMY N/A 04/27/2019   Procedure: COLECTOMY WITH COLOSTOMY;  Surgeon: Sung Amabile, DO;  Location: [facility];  Service: General;  Laterality: N/A;   COLONOSCOPY WITH PROPOFOL N/A 04/24/2019   Procedure: COLONOSCOPY WITH PROPOFOL;  Surgeon: Sung Amabile, DO;  Location: [facility];  Service: General;  Laterality: N/A;   LAPAROSCOPIC LYSIS OF ADHESIONS  05/06/2019   Procedure: LAPAROSCOPIC LYSIS OF ADHESIONS;  Surgeon: Sung Amabile, DO;  Location: [facility];  Service: General;;   LAPAROSCOPY N/A 05/06/2019   Procedure: LAPAROSCOPY DIAGNOSTIC;  Surgeon: Sung Amabile, DO;  Location: [facility];   Service: General;  Laterality: N/A;   REPLACEMENT TOTAL KNEE BILATERAL     XI ROBOTIC ASSISTED COLOSTOMY TAKEDOWN N/A 02/03/2020   Procedure: XI ROBOTIC ASSISTED COLOSTOMY TAKEDOWN;  Surgeon: Sung Amabile, DO;  Location: [facility];  Service: General;  Laterality: N/A;    History reviewed. No pertinent family history.  Social History   Socioeconomic History   Marital status: Married    Spouse name: Not on file   Number of children: Not on file   Years of education: Not on file   Highest education level: Not on file  Occupational History   Not on file  Tobacco Use   Smoking status: Former    Types: Cigarettes    Quit date: 1971    Years since quitting: 52.4   Smokeless tobacco: Current    Types: Chew   Tobacco comments:    quit smoking 1969 or 1970  Vaping Use   Vaping Use: Never used  Substance and Sexual Activity   Alcohol use: Yes    Alcohol/week: 24.0 standard drinks    Types: 24 Cans of beer per week    Comment: 3-4 per day   Drug use: Never   Sexual activity: Yes  Other Topics Concern   Not on file  Social History Narrative   Not on file   Social Determinants of Health   Financial Resource Strain: Low Risk    Difficulty of Paying Living Expenses: Not hard at all  Food Insecurity: No Food Insecurity  Worried About Charity fundraiser in the Last Year: Never true   T[place] in the Last Year: Never true  Transportation Needs: No Transportation Needs   Lack of Transportation (Medical): No   Lack of Transportation (Non-Medical): No  Physical Activity: Insufficiently Active   Days of Exercise per Week: 2 days   Minutes of Exercise per Session: 20 min  Stress: No Stress Concern Present   Feeling of Stress : Not at all  Social Connections: Socially Integrated   Frequency of Communication with Friends and Family: More than three times a week   Frequency of Social Gatherings with Friends and Family: More than three times a week   Attends Religious Services:  More than 4 times per year   Active Member of [employer] or Organizations: Yes   Attends Music therapist: More than 4 times per year   Marital Status: Married  Human resources officer Violence: Not At Risk   Fear of Current or Ex-Partner: No   Emotionally Abused: No   Physically Abused: No   Sexually Abused: No     Current Outpatient Medications:    amLODipine (NORVASC) 5 MG tablet, Take 1 tablet (5 mg total) by mouth daily., Disp: 90 tablet, Rfl: 3   apixaban (ELIQUIS) 5 MG TABS tablet, Take 1 tablet (5 mg total) by mouth 2 (two) times daily., Disp: 180 tablet, Rfl: 3   cloNIDine (CATAPRES) 0.1 MG tablet, TAKE 1 TABLET BY MOUTH DAILY, Disp: 90 tablet, Rfl: 2   labetalol (NORMODYNE) 200 MG tablet, TAKE 1 TABLET BY MOUTH DAILY, Disp: 90 tablet, Rfl: 3   Multiple Vitamin (MULTIVITAMIN WITH MINERALS) TABS tablet, Take 1 tablet by mouth daily., Disp: , Rfl:    triamcinolone cream (KENALOG) 0.1 %, Apply 1 application. topically 2 (two) times daily., Disp: 15 g, Rfl: 6   Allergies  Allergen Reactions   Codeine Nausea And Vomiting   Morphine And Related Nausea And Vomiting    ROS Review of Systems  Constitutional: Negative.   HENT: Negative.    Eyes: Negative.   Respiratory: Negative.    Cardiovascular: Negative.   Gastrointestinal: Negative.   Endocrine: Negative.   Genitourinary: Negative.   Musculoskeletal: Negative.   Skin: Negative.   Allergic/Immunologic: Negative.   Neurological: Negative.   Hematological: Negative.   Psychiatric/Behavioral: Negative.    All other systems reviewed and are negative.    Objective:    Physical Exam Vitals reviewed.  Constitutional:      Appearance: Normal appearance.  HENT:     Mouth/Throat:     Mouth: Mucous membranes are moist.  Eyes:     Pupils: Pupils are equal, round, and reactive to light.  Neck:     Vascular: No carotid bruit.  Cardiovascular:     Rate and Rhythm: Normal rate. Rhythm irregular.     Pulses: Normal  pulses.     Heart sounds: Normal heart sounds.    No friction rub.  Pulmonary:     Effort: Pulmonary effort is normal.     Breath sounds: Normal breath sounds.  Abdominal:     General: Bowel sounds are normal.     Palpations: Abdomen is soft. There is no hepatomegaly, splenomegaly or mass.     Tenderness: There is no abdominal tenderness.     Hernia: No hernia is present.  Musculoskeletal:     Cervical back: Neck supple.     Right lower leg: No edema.     Left lower leg: No  edema.  Skin:    Findings: No rash.  Neurological:     Mental Status: He is alert and oriented to person, place, and time.     Motor: No weakness.  Psychiatric:        Mood and Affect: Mood normal.        Behavior: Behavior normal.    BP 114/79   Pulse 76   Ht 6\' 1"  (1.854 m)   Wt 239 lb 11.2 oz (108.7 kg)   BMI 31.62 kg/m  Wt Readings from Last 3 Encounters:  04/14/22 239 lb 11.2 oz (108.7 kg)  02/19/22 238 lb 8 oz (108.2 kg)  02/05/22 240 lb (108.9 kg)     Health Maintenance Due  Topic Date Due   COVID-19 Vaccine (1) Never done   Zoster Vaccines- Shingrix (1 of 2) Never done   Pneumonia Vaccine 42+ Years old (1 - PCV) Never done    There are no preventive care reminders to display for this patient.  No results found for: TSH Lab Results  Component Value Date   WBC 5.4 08/19/2021   HGB 12.3 (L) 08/19/2021   HCT 36.6 (L) 08/19/2021   MCV 100.8 (H) 08/19/2021   PLT 170 08/19/2021   Lab Results  Component Value Date   NA 138 08/19/2021   K 3.6 08/19/2021   CO2 24 08/19/2021   GLUCOSE 109 (H) 08/19/2021   BUN 18 08/19/2021   CREATININE 1.35 (H) 08/19/2021   BILITOT 0.9 08/19/2021   ALKPHOS 70 05/09/2019   AST 21 08/19/2021   ALT 15 08/19/2021   PROT 6.8 08/19/2021   ALBUMIN 2.6 (L) 05/09/2019   CALCIUM 9.0 08/19/2021   ANIONGAP 9 02/04/2020   EGFR 53 (L) 08/19/2021   No results found for: CHOL No results found for: HDL No results found for: [facility] Lab Results  Component  Value Date   TRIG 106 05/09/2019   No results found for: CHOLHDL No results found for: 05/11/2019    Assessment & Plan:   Problem List Items Addressed This Visit       Cardiovascular and Mediastinum   Hypertension     Patient denies any chest pain or shortness of breath there is no history of palpitation or paroxysmal nocturnal dyspnea   patient was advised to follow low-salt low-cholesterol diet    ideally I want to keep systolic blood pressure below [id number] mmHg, patient was asked to check blood pressure one times a week and give me a report on that.  Patient will be follow-up in 3 months  or earlier as needed, patient will call me back for any change in the cardiovascular symptoms Patient was advised to buy a book from local bookstore concerning blood pressure and read several chapters  every day.  This will be supplemented by some of the material we will give him from the office.  Patient should also utilize other resources like YouTube and Internet to learn more about the blood pressure and the diet.       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 topically 2 (two)  times daily.    Dispense:  15 g    Refill:  6   amLODipine (NORVASC) 5 MG tablet    Sig: Take 1 tablet (5 mg total) by mouth daily.    Dispense:  90 tablet    Refill:  3   apixaban (ELIQUIS) 5 MG TABS tablet    Sig: Take 1 tablet (5 mg total) by mouth 2 (two) times daily.    Dispense:  180 tablet    Refill:  3    Follow-up: No follow-ups on file.    Cletis Athens, MD

## 2022-04-22 ENCOUNTER — Other Ambulatory Visit: Payer: Self-pay | Admitting: Internal Medicine

## 2022-04-22 DIAGNOSIS — I10 Essential (primary) hypertension: Secondary | ICD-10-CM

## 2022-06-16 DIAGNOSIS — I4819 Other persistent atrial fibrillation: Secondary | ICD-10-CM | POA: Diagnosis not present

## 2022-06-16 DIAGNOSIS — R600 Localized edema: Secondary | ICD-10-CM | POA: Diagnosis not present

## 2022-06-16 DIAGNOSIS — I10 Essential (primary) hypertension: Secondary | ICD-10-CM | POA: Diagnosis not present

## 2022-06-16 DIAGNOSIS — I350 Nonrheumatic aortic (valve) stenosis: Secondary | ICD-10-CM | POA: Diagnosis not present

## 2022-07-08 ENCOUNTER — Encounter: Payer: Self-pay | Admitting: Internal Medicine

## 2022-07-08 ENCOUNTER — Ambulatory Visit (INDEPENDENT_AMBULATORY_CARE_PROVIDER_SITE_OTHER): Payer: Medicare Other | Admitting: Internal Medicine

## 2022-07-08 DIAGNOSIS — E669 Obesity, unspecified: Secondary | ICD-10-CM | POA: Diagnosis not present

## 2022-07-08 DIAGNOSIS — I482 Chronic atrial fibrillation, unspecified: Secondary | ICD-10-CM | POA: Diagnosis not present

## 2022-07-08 DIAGNOSIS — M7989 Other specified soft tissue disorders: Secondary | ICD-10-CM

## 2022-07-08 DIAGNOSIS — I83892 Varicose veins of left lower extremities with other complications: Secondary | ICD-10-CM

## 2022-07-08 DIAGNOSIS — I10 Essential (primary) hypertension: Secondary | ICD-10-CM | POA: Diagnosis not present

## 2022-07-08 NOTE — Assessment & Plan Note (Signed)
Atrial fibrillation is stable heart is a regular chest is clear patient has pedal edema in the left leg more than the right.  He also has varicose vein and chronic dermatitis of the left leg patient will be referred to vascular specialist for evaluation.

## 2022-07-08 NOTE — Assessment & Plan Note (Signed)

## 2022-07-08 NOTE — Assessment & Plan Note (Signed)
Refer to Dr. Dew 

## 2022-07-08 NOTE — Assessment & Plan Note (Signed)

## 2022-07-08 NOTE — Progress Notes (Unsigned)
Established Patient Office Visit  Subjective:  Patient ID: Mark Blevins, Mark Blevins    DOB: 11-03-1941  Age: 81 y.o. MRN: [id number]  CC: No chief complaint on file.   HPI  Mark Blevins presents for swelling  and dermatitis  of both legs' lt more than right  Past Medical History:  Diagnosis Date   Aortic valve stenosis    moderate (TTE 2022)   Atrial fibrillation (HCC)    BPH (benign prostatic hyperplasia)    Hypertension    Poor circulation of extremity    left leg   Venous reflux    Wears dentures    Full upper and lower    Past Surgical History:  Procedure Laterality Date   APPENDECTOMY     BACK SURGERY     CATARACT EXTRACTION W/PHACO Left 02/05/2022   Procedure: CATARACT EXTRACTION PHACO AND INTRAOCULAR LENS PLACEMENT ([place]) LEFT MALYUGIN 19.75 01:59.8;  Surgeon: Leandrew Koyanagi, MD;  Location: [place];  Service: Ophthalmology;  Laterality: Left;   CATARACT EXTRACTION W/PHACO Right 02/19/2022   Procedure: CATARACT EXTRACTION PHACO AND INTRAOCULAR LENS PLACEMENT ([place]) RIGHT MALYUGIN 1900 01:55.6;  Surgeon: Leandrew Koyanagi, MD;  Location: [place];  Service: Ophthalmology;  Laterality: Right;   COLECTOMY N/A 04/27/2019   Procedure: COLECTOMY WITH COLOSTOMY;  Surgeon: [name], DO;  Location: [facility];  Service: General;  Laterality: N/A;   COLONOSCOPY WITH PROPOFOL N/A 04/24/2019   Procedure: COLONOSCOPY WITH PROPOFOL;  Surgeon: [name], DO;  Location: [facility];  Service: General;  Laterality: N/A;   LAPAROSCOPIC LYSIS OF ADHESIONS  05/06/2019   Procedure: LAPAROSCOPIC LYSIS OF ADHESIONS;  Surgeon: [name], DO;  Location: [facility];  Service: General;;   LAPAROSCOPY N/A 05/06/2019   Procedure: LAPAROSCOPY DIAGNOSTIC;  Surgeon: [name], DO;  Location: [facility];  Service: General;  Laterality: N/A;   REPLACEMENT TOTAL KNEE BILATERAL     XI ROBOTIC ASSISTED COLOSTOMY TAKEDOWN N/A 02/03/2020   Procedure: XI ROBOTIC ASSISTED  COLOSTOMY TAKEDOWN;  Surgeon: [name], DO;  Location: [facility];  Service: General;  Laterality: N/A;    History reviewed. No pertinent family history.  Social History   Socioeconomic History   Marital status: Married    Spouse name: Not on file   Number of children: Not on file   Years of education: Not on file   Highest education level: Not on file  Occupational History   Not on file  Tobacco Use   Smoking status: Former    Types: Cigarettes    Quit date: 1971    Years since quitting: 52.6   Smokeless tobacco: Current    Types: Chew   Tobacco comments:    quit smoking 1969 or 1970  Vaping Use   Vaping Use: Never used  Substance and Sexual Activity   Alcohol use: Yes    Alcohol/week: 24.0 standard drinks of alcohol    Types: 24 Cans of beer per week    Comment: 3-4 per day   Drug use: Never   Sexual activity: Yes  Other Topics Concern   Not on file  Social History Narrative   Not on file   Social Determinants of Health   Financial Resource Strain: Low Risk  (08/22/2021)   Overall Financial Resource Strain (CARDIA)    Difficulty of Paying Living Expenses: Not hard at all  Food Insecurity: No Food Insecurity (08/22/2021)   Hunger Vital Sign    Worried About Running Out of Food in the Last Year:  Never true    Ran Out of Food in the Last Year: Never true  Transportation Needs: No Transportation Needs (08/22/2021)   PRAPARE - Hydrologist (Medical): No    Lack of Transportation (Non-Medical): No  Physical Activity: Insufficiently Active (08/22/2021)   Exercise Vital Sign    Days of Exercise per Week: 2 days    Minutes of Exercise per Session: 20 min  Stress: No Stress Concern Present (08/22/2021)   [place]    Feeling of Stress : Not at all  Social Connections: [place] (08/22/2021)   Social Connection and Isolation Panel [NHANES]    Frequency of  Communication with Friends and Family: More than three times a week    Frequency of Social Gatherings with Friends and Family: More than three times a week    Attends Religious Services: More than 4 times per year    Active Member of [employer] or Organizations: Yes    Attends Archivist Meetings: More than 4 times per year    Marital Status: Married  Human resources officer Violence: Not At Risk (08/22/2021)   Humiliation, Afraid, Rape, and Kick questionnaire    Fear of Current or Ex-Partner: No    Emotionally Abused: No    Physically Abused: No    Sexually Abused: No     Current Outpatient Medications:    amLODipine (NORVASC) 5 MG tablet, TAKE 1 TABLET(5 MG) BY MOUTH DAILY, Disp: 90 tablet, Rfl: 3   apixaban (ELIQUIS) 5 MG TABS tablet, Take 1 tablet (5 mg total) by mouth 2 (two) times daily., Disp: 180 tablet, Rfl: 3   cloNIDine (CATAPRES) 0.1 MG tablet, TAKE 1 TABLET BY MOUTH DAILY, Disp: 90 tablet, Rfl: 2   labetalol (NORMODYNE) 200 MG tablet, TAKE 1 TABLET BY MOUTH DAILY, Disp: 90 tablet, Rfl: 3   Multiple Vitamin (MULTIVITAMIN WITH MINERALS) TABS tablet, Take 1 tablet by mouth daily., Disp: , Rfl:    triamcinolone cream (KENALOG) 0.1 %, Apply 1 application. topically 2 (two) times daily., Disp: 15 g, Rfl: 6   Allergies  Allergen Reactions   Codeine Nausea And Vomiting   Morphine And Related Nausea And Vomiting    ROS Review of Systems  Constitutional: Negative.   HENT: Negative.    Eyes: Negative.   Respiratory: Negative.    Cardiovascular: Negative.   Gastrointestinal: Negative.   Endocrine: Negative.   Genitourinary: Negative.   Musculoskeletal: Negative.  Negative for back pain and gait problem.  Skin: Negative.   Allergic/Immunologic: Negative.   Neurological: Negative.  Negative for dizziness.  Hematological: Negative.   Psychiatric/Behavioral: Negative.    All other systems reviewed and are negative.     Objective:    Physical Exam Vitals reviewed.   Constitutional:      Appearance: Normal appearance.  HENT:     Mouth/Throat:     Mouth: Mucous membranes are moist.  Eyes:     Pupils: Pupils are equal, round, and reactive to light.  Neck:     Vascular: No carotid bruit.  Cardiovascular:     Rate and Rhythm: Normal rate and regular rhythm.     Pulses: Normal pulses.     Heart sounds: Normal heart sounds.  Pulmonary:     Effort: Pulmonary effort is normal.     Breath sounds: Normal breath sounds.  Abdominal:     General: Bowel sounds are normal.     Palpations: Abdomen is soft. There  is no hepatomegaly, splenomegaly or mass.     Tenderness: There is no abdominal tenderness.     Hernia: No hernia is present.  Musculoskeletal:     Cervical back: Neck supple.     Right lower leg: No edema.     Left lower leg: No edema.       Legs:     Comments: No calf tenderness  Skin:    Findings: No rash.  Neurological:     Mental Status: He is alert and oriented to person, place, and time.     Motor: No weakness.  Psychiatric:        Mood and Affect: Mood normal.        Behavior: Behavior normal.     There were no vitals taken for this visit. Wt Readings from Last 3 Encounters:  04/14/22 239 lb 11.2 oz (108.7 kg)  02/19/22 238 lb 8 oz (108.2 kg)  02/05/22 240 lb (108.9 kg)     Health Maintenance Due  Topic Date Due   COVID-19 Vaccine (1) Never done   Zoster Vaccines- Shingrix (1 of 2) Never done   Pneumonia Vaccine 14+ Years old (1 - PCV) Never done   INFLUENZA VACCINE  06/10/2022    There are no preventive care reminders to display for this patient.  No results found for: "TSH" Lab Results  Component Value Date   WBC 5.4 08/19/2021   HGB 12.3 (L) 08/19/2021   HCT 36.6 (L) 08/19/2021   MCV 100.8 (H) 08/19/2021   PLT 170 08/19/2021   Lab Results  Component Value Date   NA 138 08/19/2021   K 3.6 08/19/2021   CO2 24 08/19/2021   GLUCOSE 109 (H) 08/19/2021   BUN 18 08/19/2021   CREATININE 1.35 (H) 08/19/2021    BILITOT 0.9 08/19/2021   ALKPHOS 70 05/09/2019   AST 21 08/19/2021   ALT 15 08/19/2021   PROT 6.8 08/19/2021   ALBUMIN 2.6 (L) 05/09/2019   CALCIUM 9.0 08/19/2021   ANIONGAP 9 02/04/2020   EGFR 53 (L) 08/19/2021   No results found for: "CHOL" No results found for: "HDL" No results found for: "LDLCALC" Lab Results  Component Value Date   TRIG 106 05/09/2019   No results found for: "CHOLHDL" No results found for: "HGBA1C"    Assessment & Plan:   Problem List Items Addressed This Visit       Cardiovascular and Mediastinum   Hypertension     Patient denies any chest pain or shortness of breath there is no history of palpitation or paroxysmal nocturnal dyspnea   patient was advised to follow low-salt low-cholesterol diet    ideally I want to keep systolic blood pressure below 130 mmHg, patient was asked to check blood pressure one times a week and give me a report on that.  Patient will be follow-up in 3 months  or earlier as needed, patient will call me back for any change in the cardiovascular symptoms Patient was advised to buy a book from local bookstore concerning blood pressure and read several chapters  every day.  This will be supplemented by some of the material we will give him from the office.  Patient should also utilize other resources like YouTube and Internet to learn more about the blood pressure and the diet.      Varicose veins of leg with swelling, left    Refer to Dr. Lucky Cowboy      Chronic atrial fibrillation [facility]) - Primary  Atrial fibrillation is stable heart is a regular chest is clear patient has pedal edema in the left leg more than the right.  He also has varicose vein and chronic dermatitis of the left leg patient will be referred to vascular specialist for evaluation.        Other   Obesity (BMI 30-39.9)    - I encouraged the patient to lose weight.  - I educated them on making healthy dietary choices including eating more fruits and vegetables and  less fried foods. - I encouraged the patient to exercise more, and educated on the benefits of exercise including weight loss, diabetes prevention, and hypertension prevention.   Dietary counseling with a registered dietician  Referral to a weight management support group (e.g. Weight Watchers, Overeaters Anonymous)  If your BMI is greater than 29 or you have gained more than 15 pounds you should work on weight loss.  Attend a healthy cooking class        No orders of the defined types were placed in this encounter.   Follow-up: No follow-ups on file.    Cletis Athens, MD

## 2022-07-10 MED ORDER — CLONIDINE HCL 0.1 MG PO TABS: 0.1000 mg | ORAL_TABLET | Freq: Every day | ORAL | 2 refills | Status: DC

## 2022-07-10 MED ORDER — LABETALOL HCL 200 MG PO TABS: 200.0000 mg | ORAL_TABLET | Freq: Every day | ORAL | 3 refills | Status: DC

## 2022-08-15 ENCOUNTER — Encounter (INDEPENDENT_AMBULATORY_CARE_PROVIDER_SITE_OTHER): Payer: Medicare Other

## 2022-08-15 ENCOUNTER — Encounter (INDEPENDENT_AMBULATORY_CARE_PROVIDER_SITE_OTHER): Payer: Medicare Other | Admitting: Nurse Practitioner

## 2022-08-28 ENCOUNTER — Other Ambulatory Visit (INDEPENDENT_AMBULATORY_CARE_PROVIDER_SITE_OTHER): Payer: Self-pay | Admitting: Nurse Practitioner

## 2022-08-28 DIAGNOSIS — I83892 Varicose veins of left lower extremities with other complications: Secondary | ICD-10-CM

## 2022-08-29 ENCOUNTER — Encounter (INDEPENDENT_AMBULATORY_CARE_PROVIDER_SITE_OTHER): Payer: Self-pay | Admitting: Nurse Practitioner

## 2022-08-29 ENCOUNTER — Ambulatory Visit (INDEPENDENT_AMBULATORY_CARE_PROVIDER_SITE_OTHER): Payer: Medicare Other

## 2022-08-29 ENCOUNTER — Ambulatory Visit (INDEPENDENT_AMBULATORY_CARE_PROVIDER_SITE_OTHER): Payer: Medicare Other | Admitting: Nurse Practitioner

## 2022-08-29 VITALS — BP 113/75 | HR 66 | Resp 16 | Wt 243.6 lb

## 2022-08-29 DIAGNOSIS — I10 Essential (primary) hypertension: Secondary | ICD-10-CM | POA: Diagnosis not present

## 2022-08-29 DIAGNOSIS — I739 Peripheral vascular disease, unspecified: Secondary | ICD-10-CM | POA: Diagnosis not present

## 2022-08-29 DIAGNOSIS — I83892 Varicose veins of left lower extremities with other complications: Secondary | ICD-10-CM

## 2022-09-07 ENCOUNTER — Encounter (INDEPENDENT_AMBULATORY_CARE_PROVIDER_SITE_OTHER): Payer: Self-pay | Admitting: Nurse Practitioner

## 2022-09-07 NOTE — Progress Notes (Signed)
Subjective:    Patient ID: Mark Blevins, male    DOB: 09-29-1941, 81 y.o.   MRN: [id number] Chief Complaint  Patient presents with   New Patient (Initial Visit)    Ref [facility] consult left vv/swelling (l/s 12/29/2017)    Mark Blevins is an 81 year old male who presents today as a referral from his primary care provider in regards to lower extremity swelling.  The patient notes that he has had swelling since his recent issues with his stomach.  This has been ongoing for approximately a year.  The patient has previously had his varicose veins treated.Marland Kitchen  He had his left saphenous vein and anterior accessory treated previously.  He notes that he also has weakness and pain in his legs.  He notes that his legs give out from his hip down after he does short distance of walking.  He notes that is not painful just weakness.  He also notes that his wound healing can be slow.  He also has known lower back issues as well.  Today noninvasive studies show evidence of venous insufficiency in the left deep venous system.  No evidence of DVT or superficial thrombophlebitis.  There is reflux in the great saphenous vein as well as some small reflux in the lesser saphenous vein.    Review of Systems  Cardiovascular:  Positive for leg swelling.  Musculoskeletal:  Positive for back pain.  All other systems reviewed and are negative.      Objective:   Physical Exam Vitals reviewed.  HENT:     Head: Normocephalic.  Cardiovascular:     Rate and Rhythm: Normal rate.  Pulmonary:     Effort: Pulmonary effort is normal.  Musculoskeletal:     Left lower leg: Edema present.  Skin:    General: Skin is warm and dry.  Neurological:     Mental Status: He is alert and oriented to person, place, and time.  Psychiatric:        Mood and Affect: Mood normal.        Behavior: Behavior normal.        Thought Content: Thought content normal.        Judgment: Judgment normal.     BP 113/75 (BP Location: Left Arm)    Pulse 66   Resp 16   Wt 243 lb 9.6 oz (110.5 kg)   BMI 32.14 kg/m   Past Medical History:  Diagnosis Date   Aortic valve stenosis    moderate (TTE 2022)   Atrial fibrillation (HCC)    BPH (benign prostatic hyperplasia)    Hypertension    Poor circulation of extremity    left leg   Venous reflux    Wears dentures    Full upper and lower    Social History   Socioeconomic History   Marital status: Married    Spouse name: Not on file   Number of children: Not on file   Years of education: Not on file   Highest education level: Not on file  Occupational History   Not on file  Tobacco Use   Smoking status: Former    Types: Cigarettes    Quit date: 1971    Years since quitting: 52.8   Smokeless tobacco: Current    Types: Chew   Tobacco comments:    quit smoking 1969 or 1970  Vaping Use   Vaping Use: Never used  Substance and Sexual Activity   Alcohol use: Yes    Alcohol/week: 24.0  standard drinks of alcohol    Types: 24 Cans of beer per week    Comment: 3-4 per day   Drug use: Never   Sexual activity: Yes  Other Topics Concern   Not on file  Social History Narrative   Not on file   Social Determinants of Health   Financial Resource Strain: Low Risk  (08/22/2021)   Overall Financial Resource Strain (CARDIA)    Difficulty of Paying Living Expenses: Not hard at all  Food Insecurity: No Food Insecurity (08/22/2021)   Hunger Vital Sign    Worried About Running Out of Food in the Last Year: Never true    Ran Out of Food in the Last Year: Never true  Transportation Needs: No Transportation Needs (08/22/2021)   PRAPARE - Hydrologist (Medical): No    Lack of Transportation (Non-Medical): No  Physical Activity: Insufficiently Active (08/22/2021)   Exercise Vital Sign    Days of Exercise per Week: 2 days    Minutes of Exercise per Session: 20 min  Stress: No Stress Concern Present (08/22/2021)   [place]    Feeling of Stress : Not at all  Social Connections: [place]ank (08/22/2021)   Social Connection and Isolation Panel [NHANES]    Frequency of Communication with Friends and Family: More than three times a week    Frequency of Social Gatherings with Friends and Family: More than three times a week    Attends Religious Services: More than 4 times per year    Active Member of [employer] or Organizations: Yes    Attends Music therapist: More than 4 times per year    Marital Status: Married  Human resources officer Violence: Not At Risk (08/22/2021)   Humiliation, Afraid, Rape, and Kick questionnaire    Fear of Current or Ex-Partner: No    Emotionally Abused: No    Physically Abused: No    Sexually Abused: No    Past Surgical History:  Procedure Laterality Date   APPENDECTOMY     BACK SURGERY     CATARACT EXTRACTION W/PHACO Left 02/05/2022   Procedure: CATARACT EXTRACTION PHACO AND INTRAOCULAR LENS PLACEMENT ([place]) LEFT MALYUGIN 19.75 01:59.8;  Surgeon: Leandrew Koyanagi, MD;  Location: [place];  Service: Ophthalmology;  Laterality: Left;   CATARACT EXTRACTION W/PHACO Right 02/19/2022   Procedure: CATARACT EXTRACTION PHACO AND INTRAOCULAR LENS PLACEMENT ([place]) RIGHT MALYUGIN 1900 01:55.6;  Surgeon: Leandrew Koyanagi, MD;  Location: [place];  Service: Ophthalmology;  Laterality: Right;   COLECTOMY N/A 04/27/2019   Procedure: COLECTOMY WITH COLOSTOMY;  Surgeon: [name], DO;  Location: [facility];  Service: General;  Laterality: N/A;   COLONOSCOPY WITH PROPOFOL N/A 04/24/2019   Procedure: COLONOSCOPY WITH PROPOFOL;  Surgeon: [name], DO;  Location: [facility];  Service: General;  Laterality: N/A;   LAPAROSCOPIC LYSIS OF ADHESIONS  05/06/2019   Procedure: LAPAROSCOPIC LYSIS OF ADHESIONS;  Surgeon: [name], DO;  Location: [facility];  Service: General;;   LAPAROSCOPY N/A 05/06/2019   Procedure:  LAPAROSCOPY DIAGNOSTIC;  Surgeon: [name], DO;  Location: [facility];  Service: General;  Laterality: N/A;   REPLACEMENT TOTAL KNEE BILATERAL     XI ROBOTIC ASSISTED COLOSTOMY TAKEDOWN N/A 02/03/2020   Procedure: XI ROBOTIC ASSISTED COLOSTOMY TAKEDOWN;  Surgeon: [name], DO;  Location: [facility];  Service: General;  Laterality: N/A;    History reviewed. No pertinent family history.  Allergies  Allergen  Reactions   Codeine Nausea And Vomiting   Morphine And Related Nausea And Vomiting       Latest Ref Rng & Units 08/19/2021   12:55 PM 02/04/2020    5:56 AM 02/03/2020    6:33 AM  CBC  WBC 3.8 - 10.8 Thousand/uL 5.4  6.7  4.6   Hemoglobin 13.2 - 17.1 g/dL 79.8  92.1  19.4   Hematocrit 38.5 - 50.0 % 36.6  34.6  37.4   Platelets 140 - 400 Thousand/uL 170  158  180       CMP     Component Value Date/Time   NA 138 08/19/2021 1255   K 3.6 08/19/2021 1255   CL 104 08/19/2021 1255   CO2 24 08/19/2021 1255   GLUCOSE 109 (H) 08/19/2021 1255   BUN 18 08/19/2021 1255   CREATININE 1.35 (H) 08/19/2021 1255   CALCIUM 9.0 08/19/2021 1255   PROT 6.8 08/19/2021 1255   ALBUMIN 2.6 (L) 05/09/2019 0633   AST 21 08/19/2021 1255   ALT 15 08/19/2021 1255   ALKPHOS 70 05/09/2019 0633   BILITOT 0.9 08/19/2021 1255   GFRNONAA 49 (L) 02/04/2020 0556   GFRAA 56 (L) 02/04/2020 0556     No results found.     Assessment & Plan:   1. Varicose veins of leg with swelling, left  Recommend:  The patient has large symptomatic varicose veins that are painful and associated with swelling.  I have had a long discussion with the patient regarding  varicose veins and why they cause symptoms.  Patient will begin wearing graduated compression stockings class 1 on a daily basis, beginning first thing in the morning and removing them in the evening. The patient is instructed specifically not to sleep in the stockings.    The patient  will also begin using over-the-counter analgesics such as Motrin  600 mg po TID to help control the symptoms.    In addition, behavioral modification including elevation during the day will be initiated.    Pending the results of these changes the  patient will be reevaluated in three months.    Further plans will be based on the ultrasound results and whether conservative therapies are successful at eliminating the pain and swelling.   - VAS [place] LOWER EXTREMITY VENOUS REFLUX  2. Claudication [facility]) When the patient returns we will have him undergo ABIs to ensure that the pain he is having is not related to perfusion versus lower back issues.  3. Benign essential HTN Continue antihypertensive medications as already ordered, these medications have been reviewed and there are no changes at this time.    Current Outpatient Medications on File Prior to Visit  Medication Sig Dispense Refill   amLODipine (NORVASC) 5 MG tablet TAKE 1 TABLET(5 MG) BY MOUTH DAILY 90 tablet 3   apixaban (ELIQUIS) 5 MG TABS tablet Take 1 tablet (5 mg total) by mouth 2 (two) times daily. 180 tablet 3   cloNIDine (CATAPRES) 0.1 MG tablet Take 1 tablet (0.1 mg total) by mouth daily. 90 tablet 2   labetalol (NORMODYNE) 200 MG tablet Take 1 tablet (200 mg total) by mouth daily. 90 tablet 3   Multiple Vitamin (MULTIVITAMIN WITH MINERALS) TABS tablet Take 1 tablet by mouth daily.     triamcinolone cream (KENALOG) 0.1 % Apply 1 application. topically 2 (two) times daily. (Patient taking differently: Apply 1 application  topically as needed.) 15 g 6   No current facility-administered medications on file prior to visit.  There are no Patient Instructions on file for this visit. No follow-ups on file.   Kris Hartmann, NP

## 2022-09-17 ENCOUNTER — Ambulatory Visit: Payer: Medicare Other

## 2022-09-18 DIAGNOSIS — H35373 Puckering of macula, bilateral: Secondary | ICD-10-CM | POA: Diagnosis not present

## 2022-10-06 DIAGNOSIS — H524 Presbyopia: Secondary | ICD-10-CM | POA: Diagnosis not present

## 2022-12-02 ENCOUNTER — Ambulatory Visit (INDEPENDENT_AMBULATORY_CARE_PROVIDER_SITE_OTHER): Payer: Medicare Other | Admitting: Vascular Surgery

## 2022-12-02 ENCOUNTER — Encounter (INDEPENDENT_AMBULATORY_CARE_PROVIDER_SITE_OTHER): Payer: Medicare Other

## 2023-05-01 ENCOUNTER — Telehealth (INDEPENDENT_AMBULATORY_CARE_PROVIDER_SITE_OTHER): Payer: Self-pay | Admitting: Vascular Surgery

## 2023-05-01 NOTE — Telephone Encounter (Signed)
Pt called in stating that his left leg is swollen again, skin around ankle is oozing water and peeling. Please advise

## 2023-05-01 NOTE — Telephone Encounter (Signed)
He can come in for unna boots

## 2023-05-04 ENCOUNTER — Ambulatory Visit (INDEPENDENT_AMBULATORY_CARE_PROVIDER_SITE_OTHER): Payer: Medicare Other | Admitting: Nurse Practitioner

## 2023-05-04 VITALS — BP 132/76 | HR 71 | Resp 18 | Ht 72.0 in | Wt 245.6 lb

## 2023-05-04 DIAGNOSIS — I83892 Varicose veins of left lower extremities with other complications: Secondary | ICD-10-CM

## 2023-05-04 NOTE — Progress Notes (Unsigned)
History of Present Illness  There is no documented history at this time  Assessments & Plan   There are no diagnoses linked to this encounter.    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    Procedure:  3 layer unna wrap was placed Left lower extremity.   Plan:   Follow up in one week.  

## 2023-05-04 NOTE — Telephone Encounter (Signed)
LVM for pt TCB and schedule for unna boots per Sheppard Plumber, NP

## 2023-05-05 ENCOUNTER — Encounter (INDEPENDENT_AMBULATORY_CARE_PROVIDER_SITE_OTHER): Payer: Self-pay | Admitting: Nurse Practitioner

## 2023-05-11 ENCOUNTER — Ambulatory Visit (INDEPENDENT_AMBULATORY_CARE_PROVIDER_SITE_OTHER): Payer: Medicare Other | Admitting: Nurse Practitioner

## 2023-05-11 ENCOUNTER — Encounter (INDEPENDENT_AMBULATORY_CARE_PROVIDER_SITE_OTHER): Payer: Self-pay

## 2023-05-11 VITALS — BP 128/87 | HR 78 | Resp 18 | Ht 72.0 in | Wt 245.0 lb

## 2023-05-11 DIAGNOSIS — I83892 Varicose veins of left lower extremities with other complications: Secondary | ICD-10-CM

## 2023-05-11 NOTE — Progress Notes (Signed)
History of Present Illness  There is no documented history at this time  Assessments & Plan   There are no diagnoses linked to this encounter.    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    Procedure:  3 layer unna wrap was placed Left lower extremity.   Plan:   Follow up in one week.  

## 2023-05-18 ENCOUNTER — Encounter (INDEPENDENT_AMBULATORY_CARE_PROVIDER_SITE_OTHER): Payer: Self-pay

## 2023-05-18 ENCOUNTER — Ambulatory Visit (INDEPENDENT_AMBULATORY_CARE_PROVIDER_SITE_OTHER): Payer: Medicare Other | Admitting: Nurse Practitioner

## 2023-05-18 VITALS — BP 128/77 | HR 55 | Resp 16

## 2023-05-18 DIAGNOSIS — I83892 Varicose veins of left lower extremities with other complications: Secondary | ICD-10-CM | POA: Diagnosis not present

## 2023-05-18 NOTE — Progress Notes (Signed)
History of Present Illness  There is no documented history at this time  Assessments & Plan   There are no diagnoses linked to this encounter.    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    Procedure:  3 layer unna wrap was placed Left lower extremity.   Plan:   Follow up in one week.  

## 2023-05-20 ENCOUNTER — Other Ambulatory Visit (INDEPENDENT_AMBULATORY_CARE_PROVIDER_SITE_OTHER): Payer: Self-pay | Admitting: Nurse Practitioner

## 2023-05-20 DIAGNOSIS — I83892 Varicose veins of left lower extremities with other complications: Secondary | ICD-10-CM

## 2023-05-25 ENCOUNTER — Encounter (INDEPENDENT_AMBULATORY_CARE_PROVIDER_SITE_OTHER): Payer: Self-pay

## 2023-05-25 ENCOUNTER — Ambulatory Visit (INDEPENDENT_AMBULATORY_CARE_PROVIDER_SITE_OTHER): Payer: Medicare Other | Admitting: Nurse Practitioner

## 2023-05-25 VITALS — BP 135/83 | HR 78 | Resp 16

## 2023-05-25 DIAGNOSIS — I83891 Varicose veins of right lower extremities with other complications: Secondary | ICD-10-CM | POA: Diagnosis not present

## 2023-05-25 DIAGNOSIS — I83892 Varicose veins of left lower extremities with other complications: Secondary | ICD-10-CM

## 2023-05-25 NOTE — Progress Notes (Signed)
History of Present Illness  There is no documented history at this time  Assessments & Plan   There are no diagnoses linked to this encounter.    Additional instructions  Subjective:  Patient presents with venous ulcer of the Right lower extremity.    Procedure:  3 layer unna wrap was placed Right lower extremity.   Plan:   Follow up in one week.   

## 2023-06-01 ENCOUNTER — Ambulatory Visit (INDEPENDENT_AMBULATORY_CARE_PROVIDER_SITE_OTHER): Payer: Medicare Other | Admitting: Nurse Practitioner

## 2023-06-01 ENCOUNTER — Ambulatory Visit (INDEPENDENT_AMBULATORY_CARE_PROVIDER_SITE_OTHER): Payer: Medicare Other

## 2023-06-01 DIAGNOSIS — I83892 Varicose veins of left lower extremities with other complications: Secondary | ICD-10-CM

## 2023-06-08 ENCOUNTER — Ambulatory Visit (INDEPENDENT_AMBULATORY_CARE_PROVIDER_SITE_OTHER): Payer: Medicare Other | Admitting: Nurse Practitioner

## 2023-06-08 ENCOUNTER — Encounter (INDEPENDENT_AMBULATORY_CARE_PROVIDER_SITE_OTHER): Payer: Self-pay

## 2023-06-08 VITALS — BP 133/82 | HR 71 | Resp 16

## 2023-06-08 DIAGNOSIS — L03119 Cellulitis of unspecified part of limb: Secondary | ICD-10-CM

## 2023-06-08 DIAGNOSIS — L02419 Cutaneous abscess of limb, unspecified: Secondary | ICD-10-CM

## 2023-06-08 MED ORDER — SULFAMETHOXAZOLE-TRIMETHOPRIM 800-160 MG PO TABS: 1.0000 | ORAL_TABLET | Freq: Two times a day (BID) | ORAL | 0 refills | Status: DC

## 2023-06-09 ENCOUNTER — Encounter (INDEPENDENT_AMBULATORY_CARE_PROVIDER_SITE_OTHER): Payer: Self-pay | Admitting: Nurse Practitioner

## 2023-06-09 NOTE — Progress Notes (Signed)
The patient has done several weeks of Unna boots and the swelling is improved but he still has drainage.  I suspect that there is some underlying infection.  While I would like to place the patient back in Unna boots he does not wish to go into them at this time.  He is advised to utilize compression.  Will have him follow-up in about 2 weeks in order to evaluate his edema as well as the drainage.

## 2023-06-29 ENCOUNTER — Ambulatory Visit (INDEPENDENT_AMBULATORY_CARE_PROVIDER_SITE_OTHER): Payer: Medicare Other | Admitting: Nurse Practitioner

## 2023-06-29 ENCOUNTER — Encounter (INDEPENDENT_AMBULATORY_CARE_PROVIDER_SITE_OTHER): Payer: Self-pay | Admitting: Nurse Practitioner

## 2023-06-29 VITALS — BP 133/83 | HR 69 | Resp 16 | Wt 235.4 lb

## 2023-06-29 DIAGNOSIS — L02419 Cutaneous abscess of limb, unspecified: Secondary | ICD-10-CM

## 2023-06-29 DIAGNOSIS — I10 Essential (primary) hypertension: Secondary | ICD-10-CM | POA: Diagnosis not present

## 2023-06-29 DIAGNOSIS — L03119 Cellulitis of unspecified part of limb: Secondary | ICD-10-CM

## 2023-06-29 DIAGNOSIS — I83892 Varicose veins of left lower extremities with other complications: Secondary | ICD-10-CM

## 2023-06-29 MED ORDER — SULFAMETHOXAZOLE-TRIMETHOPRIM 800-160 MG PO TABS: 1.0000 | ORAL_TABLET | Freq: Two times a day (BID) | ORAL | 0 refills | Status: DC

## 2023-06-29 NOTE — Progress Notes (Signed)
Subjective:    Patient ID: Mark Blevins, male    DOB: 01/28/1941, 82 y.o.   MRN: [id number] Chief Complaint  Patient presents with   Follow-up    2 week follow up    The patient returns today after being out of the boot for the last several weeks.  He had some drainage and some redness despite being on for several weeks.  He was placed on Bactrim which did help significantly.  He still has some scabbing in the areas.  He also has continued edema.  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    Review of Systems  Cardiovascular:  Positive for leg swelling.  Skin:  Positive for wound.  All other systems reviewed and are negative.      Objective:   Physical Exam Vitals reviewed.  HENT:     Head: Normocephalic.  Cardiovascular:     Rate and Rhythm: Normal rate.  Pulmonary:     Effort: Pulmonary effort is normal.  Musculoskeletal:     Right lower leg: Edema present.     Left lower leg: Edema present.  Skin:    General: Skin is warm and dry.  Neurological:     Mental Status: He is alert and oriented to person, place, and time.  Psychiatric:        Mood and Affect: Mood normal.        Behavior: Behavior normal.        Thought Content: Thought content normal.        Judgment: Judgment normal.     BP 133/83 (BP Location: Left Arm)   Pulse 69   Resp 16   Wt 235 lb 6.4 oz (106.8 kg)   BMI 31.93 kg/m   Past Medical History:  Diagnosis Date   Aortic valve stenosis    moderate (TTE 2022)   Atrial fibrillation (HCC)    BPH (benign prostatic hyperplasia)    Hypertension    Poor circulation of extremity    left leg   Venous reflux    Wears dentures    Full upper and lower    Social History   Socioeconomic History   Marital status: Married    Spouse name: Not on file   Number of children: Not on file   Years of education: Not on file   Highest education level: Not on  file  Occupational History   Not on file  Tobacco Use   Smoking status: Former    Current packs/day: 0.00    Types: Cigarettes    Quit date: 1971    Years since quitting: 53.6   Smokeless tobacco: Current    Types: Chew   Tobacco comments:    quit smoking 1969 or 1970  Vaping Use   Vaping status: Never Used  Substance and Sexual Activity   Alcohol use: Yes    Alcohol/week: 24.0 standard drinks of alcohol    Types: 24 Cans of beer per week    Comment: 3-4 per day   Drug use: Never   Sexual activity: Yes  Other Topics Concern   Not on file  Social History Narrative   Not on file   Social Determinants of Health   Financial Resource Strain: Low Risk  (08/22/2021)   Overall Financial Resource Strain (CARDIA)    Difficulty of Paying Living Expenses: Not hard at all  Food Insecurity: No Food Insecurity (08/22/2021)   Hunger Vital  Sign    Worried About Programme researcher, broadcasting/film/video in the Last Year: Never true    Ran Out of Food in the Last Year: Never true  Transportation Needs: No Transportation Needs (08/22/2021)   PRAPARE - Administrator, Civil Service (Medical): No    Lack of Transportation (Non-Medical): No  Physical Activity: Insufficiently Active (08/22/2021)   Exercise Vital Sign    Days of Exercise per Week: 2 days    Minutes of Exercise per Session: 20 min  Stress: No Stress Concern Present (08/22/2021)   [employer] of Occupational Health - Occupational Stress Questionnaire    Feeling of Stress : Not at all  Social Connections: Socially Integrated (08/22/2021)   Social Connection and Isolation Panel [NHANES]    Frequency of Communication with Friends and Family: More than three times a week    Frequency of Social Gatherings with Friends and Family: More than three times a week    Attends Religious Services: More than 4 times per year    Active Member of [employer] or Organizations: Yes    Attends Engineer, structural: More than 4 times per year     Marital Status: Married  Catering manager Violence: Not At Risk (08/22/2021)   Humiliation, Afraid, Rape, and Kick questionnaire    Fear of Current or Ex-Partner: No    Emotionally Abused: No    Physically Abused: No    Sexually Abused: No    Past Surgical History:  Procedure Laterality Date   APPENDECTOMY     BACK SURGERY     CATARACT EXTRACTION W/PHACO Left 02/05/2022   Procedure: CATARACT EXTRACTION PHACO AND INTRAOCULAR LENS PLACEMENT ([place]) LEFT MALYUGIN 19.75 01:59.8;  Surgeon: Lockie Mola, MD;  Location: [facility];  Service: Ophthalmology;  Laterality: Left;   CATARACT EXTRACTION W/PHACO Right 02/19/2022   Procedure: CATARACT EXTRACTION PHACO AND INTRAOCULAR LENS PLACEMENT ([place]) RIGHT MALYUGIN 1900 01:55.6;  Surgeon: Lockie Mola, MD;  Location: [facility];  Service: Ophthalmology;  Laterality: Right;   COLECTOMY N/A 04/27/2019   Procedure: COLECTOMY WITH COLOSTOMY;  Surgeon: Sung Amabile, DO;  Location: [facility];  Service: General;  Laterality: N/A;   COLONOSCOPY WITH PROPOFOL N/A 04/24/2019   Procedure: COLONOSCOPY WITH PROPOFOL;  Surgeon: Sung Amabile, DO;  Location: [facility];  Service: General;  Laterality: N/A;   LAPAROSCOPIC LYSIS OF ADHESIONS  05/06/2019   Procedure: LAPAROSCOPIC LYSIS OF ADHESIONS;  Surgeon: Sung Amabile, DO;  Location: [facility];  Service: General;;   LAPAROSCOPY N/A 05/06/2019   Procedure: LAPAROSCOPY DIAGNOSTIC;  Surgeon: Sung Amabile, DO;  Location: [facility];  Service: General;  Laterality: N/A;   REPLACEMENT TOTAL KNEE BILATERAL     XI ROBOTIC ASSISTED COLOSTOMY TAKEDOWN N/A 02/03/2020   Procedure: XI ROBOTIC ASSISTED COLOSTOMY TAKEDOWN;  Surgeon: Sung Amabile, DO;  Location: [facility];  Service: General;  Laterality: N/A;    History reviewed. No pertinent family history.  Allergies  Allergen Reactions   Codeine Nausea And Vomiting   Morphine And Codeine Nausea And Vomiting       Latest Ref Rng & Units  08/19/2021   12:55 PM 02/04/2020    5:56 AM 02/03/2020    6:33 AM  CBC  WBC 3.8 - 10.8 Thousand/uL 5.4  6.7  4.6   Hemoglobin 13.2 - 17.1 g/dL 78.2  95.6  21.3   Hematocrit 38.5 - 50.0 % 36.6  34.6  37.4   Platelets 140 - 400 Thousand/uL 170  158  180  CMP     Component Value Date/Time   NA 138 08/19/2021 1255   K 3.6 08/19/2021 1255   CL 104 08/19/2021 1255   CO2 24 08/19/2021 1255   GLUCOSE 109 (H) 08/19/2021 1255   BUN 18 08/19/2021 1255   CREATININE 1.35 (H) 08/19/2021 1255   CALCIUM 9.0 08/19/2021 1255   PROT 6.8 08/19/2021 1255   ALBUMIN 2.6 (L) 05/09/2019 0633   AST 21 08/19/2021 1255   ALT 15 08/19/2021 1255   ALKPHOS 70 05/09/2019 0633   BILITOT 0.9 08/19/2021 1255   EGFR 53 (L) 08/19/2021 1255   GFRNONAA 49 (L) 02/04/2020 0556     No results found.     Assessment & Plan:   1. Varicose veins of leg with swelling, left I had a long discussion with the patient in regards to his lower extremity swelling and ulceration.  The patient notes that he has swelling is much improved post ablation for his great saphenous vein.  Given that there is a concern for recannulization the patient having worsening swelling would be consistent with these findings.  We discussed repeat endovenous laser ablation however the patient is not ready to move in that direction at this time.  Instead he wishes to continue with conservative therapy.  Will have her return in 3 months in order to reevaluate lower extremities but he is noted that if he begins to have issues to follow-up with [place] sooner.  2. Cellulitis and abscess of leg Today the collections noted  The patient has been concerned about continued infection given some of the drainage.  We will send an additional several days of Bactrim.  3. Benign essential HTN Continue antihypertensive medications as already ordered, these medications have been reviewed and there are no changes at this time.   Current Outpatient Medications  on File Prior to Visit  Medication Sig Dispense Refill   amLODipine (NORVASC) 5 MG tablet TAKE 1 TABLET(5 MG) BY MOUTH DAILY 90 tablet 3   apixaban (ELIQUIS) 5 MG TABS tablet Take 1 tablet (5 mg total) by mouth 2 (two) times daily. 180 tablet 3   cephALEXin (KEFLEX) 500 MG capsule Take 500 mg by mouth 2 (two) times daily.     cloNIDine (CATAPRES) 0.1 MG tablet Take 1 tablet (0.1 mg total) by mouth daily. 90 tablet 2   labetalol (NORMODYNE) 200 MG tablet Take 1 tablet (200 mg total) by mouth daily. 90 tablet 3   Multiple Vitamin (MULTIVITAMIN WITH MINERALS) TABS tablet Take 1 tablet by mouth daily.     No current facility-administered medications on file prior to visit.    There are no Patient Instructions on file for this visit. No follow-ups on file.   Georgiana Spinner, NP

## 2023-06-30 ENCOUNTER — Encounter (INDEPENDENT_AMBULATORY_CARE_PROVIDER_SITE_OTHER): Payer: Self-pay | Admitting: Nurse Practitioner

## 2023-09-28 ENCOUNTER — Encounter (INDEPENDENT_AMBULATORY_CARE_PROVIDER_SITE_OTHER): Payer: Self-pay | Admitting: Vascular Surgery

## 2023-09-28 ENCOUNTER — Ambulatory Visit (INDEPENDENT_AMBULATORY_CARE_PROVIDER_SITE_OTHER): Payer: Medicare Other | Admitting: Vascular Surgery

## 2023-09-28 VITALS — BP 124/78 | HR 67 | Resp 16 | Wt 242.6 lb

## 2023-09-28 DIAGNOSIS — L97321 Non-pressure chronic ulcer of left ankle limited to breakdown of skin: Secondary | ICD-10-CM | POA: Diagnosis not present

## 2023-09-28 DIAGNOSIS — L97909 Non-pressure chronic ulcer of unspecified part of unspecified lower leg with unspecified severity: Secondary | ICD-10-CM | POA: Insufficient documentation

## 2023-09-28 DIAGNOSIS — I872 Venous insufficiency (chronic) (peripheral): Secondary | ICD-10-CM

## 2023-09-28 DIAGNOSIS — I890 Lymphedema, not elsewhere classified: Secondary | ICD-10-CM | POA: Diagnosis not present

## 2023-09-28 DIAGNOSIS — I4819 Other persistent atrial fibrillation: Secondary | ICD-10-CM

## 2023-09-28 DIAGNOSIS — I83023 Varicose veins of left lower extremity with ulcer of ankle: Secondary | ICD-10-CM | POA: Diagnosis not present

## 2023-09-28 DIAGNOSIS — I10 Essential (primary) hypertension: Secondary | ICD-10-CM | POA: Diagnosis not present

## 2023-09-28 NOTE — Progress Notes (Signed)
MRN : [id number]  Mark Blevins is a 82 y.o. (08-Apr-1941) male who presents with chief complaint of legs swell.  History of Present Illness:  Patient is seen for follow up evaluation of leg pain and swelling associated with venous ulceration. The patient was recently seen here and started on Unna boot therapy.  This was approximately 3 months ago and we were able to get his wounds healed.  He notes the swelling abruptly became much worse predominantly on the left and is associated with pain and discoloration.  Subsequently over the past week or so he has developed several wounds that have been draining copious amounts of fluid.  He notes that he is going through 4 or more pairs of socks a day because that socks become saturated and he must change them.  The pain and swelling worsens with prolonged dependency and improves with elevation.  The patient notes that in the morning the legs are better but the leg symptoms worsened throughout the course of the day. The patient has also noted a progressive worsening of the discoloration in the ankle and shin area.   The patient notes that an ulcer has developed acutely as described above without specific trauma and since it occurred it has been very slow to heal.  There is a moderate amount of drainage associated with the open area.  The wound is also painful.  The patient states that they have been elevating as much as possible. The patient denies any recent changes in medications.  The patient denies a history of DVT or PE. There is no prior history of phlebitis. There is no history of primary lymphedema.  No SOB or increased cough.  No sputum production.  No recent episodes of CHF exacerbation.  Previous venous ultrasound of the left lower extremity demonstrated recanalization of the previous laser ablation of the left great saphenous vein.  Small saphenous vein was competent.  No evidence for DVT.  Current Meds   Medication Sig   amLODipine (NORVASC) 5 MG tablet TAKE 1 TABLET(5 MG) BY MOUTH DAILY   apixaban (ELIQUIS) 5 MG TABS tablet Take 1 tablet (5 mg total) by mouth 2 (two) times daily.   cloNIDine (CATAPRES) 0.1 MG tablet Take 1 tablet (0.1 mg total) by mouth daily.   labetalol (NORMODYNE) 200 MG tablet Take 1 tablet (200 mg total) by mouth daily.   Multiple Vitamin (MULTIVITAMIN WITH MINERALS) TABS tablet Take 1 tablet by mouth daily.   silver sulfADIAZINE (SILVADENE) 1 % cream Apply 1 Application topically daily.    Past Medical History:  Diagnosis Date   Aortic valve stenosis    moderate (TTE 2022)   Atrial fibrillation (HCC)    BPH (benign prostatic hyperplasia)    Hypertension    Poor circulation of extremity    left leg   Venous reflux    Wears dentures    Full upper and lower    Past Surgical History:  Procedure Laterality Date   APPENDECTOMY     BACK SURGERY     CATARACT EXTRACTION W/PHACO Left 02/05/2022   Procedure: CATARACT EXTRACTION PHACO AND INTRAOCULAR LENS PLACEMENT (IOC) LEFT MALYUGIN 19.75 01:59.8;  Surgeon: Lockie Mola, MD;  Location: [facility];  Service: Ophthalmology;  Laterality: Left;   CATARACT EXTRACTION W/PHACO Right 02/19/2022   Procedure: CATARACT EXTRACTION PHACO AND  INTRAOCULAR LENS PLACEMENT (IOC) RIGHT MALYUGIN 1900 01:55.6;  Surgeon: Lockie Mola, MD;  Location: [facility];  Service: Ophthalmology;  Laterality: Right;   COLECTOMY N/A 04/27/2019   Procedure: COLECTOMY WITH COLOSTOMY;  Surgeon: Sung Amabile, DO;  Location: [facility];  Service: General;  Laterality: N/A;   COLONOSCOPY WITH PROPOFOL N/A 04/24/2019   Procedure: COLONOSCOPY WITH PROPOFOL;  Surgeon: Sung Amabile, DO;  Location: [facility];  Service: General;  Laterality: N/A;   LAPAROSCOPIC LYSIS OF ADHESIONS  05/06/2019   Procedure: LAPAROSCOPIC LYSIS OF ADHESIONS;  Surgeon: Sung Amabile, DO;  Location: [facility];  Service: General;;   LAPAROSCOPY N/A  05/06/2019   Procedure: LAPAROSCOPY DIAGNOSTIC;  Surgeon: Sung Amabile, DO;  Location: [facility];  Service: General;  Laterality: N/A;   REPLACEMENT TOTAL KNEE BILATERAL     XI ROBOTIC ASSISTED COLOSTOMY TAKEDOWN N/A 02/03/2020   Procedure: XI ROBOTIC ASSISTED COLOSTOMY TAKEDOWN;  Surgeon: Sung Amabile, DO;  Location: [facility];  Service: General;  Laterality: N/A;    Social History Social History   Tobacco Use   Smoking status: Former    Current packs/day: 0.00    Types: Cigarettes    Quit date: 1971    Years since quitting: 53.9   Smokeless tobacco: Current    Types: Chew   Tobacco comments:    quit smoking 1969 or 1970  Vaping Use   Vaping status: Never Used  Substance Use Topics   Alcohol use: Yes    Alcohol/week: 24.0 standard drinks of alcohol    Types: 24 Cans of beer per week    Comment: 3-4 per day   Drug use: Never    Family History No family history on file.  Allergies  Allergen Reactions   Codeine Nausea And Vomiting   Morphine And Codeine Nausea And Vomiting     REVIEW OF SYSTEMS (Negative unless checked)  Constitutional: [] Weight loss  [] Fever  [] Chills Cardiac: [] Chest pain   [] Chest pressure   [] Palpitations   [] Shortness of breath when laying flat   [] Shortness of breath with exertion. Vascular:  [] Pain in legs with walking   [x] Pain in legs with standing  [] History of DVT   [] Phlebitis   [x] Swelling in legs   [] Varicose veins   [] Non-healing ulcers Pulmonary:   [] Uses home oxygen   [] Productive cough   [] Hemoptysis   [] Wheeze  [] COPD   [] Asthma Neurologic:  [] Dizziness   [] Seizures   [] History of stroke   [] History of TIA  [] Aphasia   [] Vissual changes   [] Weakness or numbness in arm   [] Weakness or numbness in leg Musculoskeletal:   [] Joint swelling   [] Joint pain   [] Low back pain Hematologic:  [] Easy bruising  [] Easy bleeding   [] Hypercoagulable state   [] Anemic Gastrointestinal:  [] Diarrhea   [] Vomiting  [] Gastroesophageal reflux/heartburn    [] Difficulty swallowing. Genitourinary:  [] Chronic kidney disease   [] Difficult urination  [] Frequent urination   [] Blood in urine Skin:  [] Rashes   [] Ulcers  Psychological:  [] History of anxiety   []  History of major depression.  Physical Examination  Vitals:   09/28/23 1042  BP: 124/78  Pulse: 67  Resp: 16  Weight: 242 lb 9.6 oz (110 kg)   Body mass index is 32.9 kg/m. Gen: WD/WN, NAD Head: [place]/AT, No temporalis wasting.  Ear/Nose/Throat: Hearing grossly intact, nares w/o erythema or drainage, pinna without lesions Eyes: PER, EOMI, sclera nonicteric.  Neck: Supple, no gross masses.  No JVD.  Pulmonary:  Good air movement, no audible  wheezing, no use of accessory muscles.  Cardiac: RRR, precordium not hyperdynamic. Vascular:  scattered varicosities present bilaterally.  Severe venous stasis changes to the legs bilaterally left is much greater than right.  3-4+ soft pitting edema again his left leg is much more severely affected than the right, CEAP C4sEpAsPr.  There are approximately 5 superficial small wounds mostly in the anterior lateral aspect just above the ankle they are draining clear fluid Vessel Right Left  Radial Palpable Palpable  Gastrointestinal: soft, non-distended. No guarding/no peritoneal signs.  Musculoskeletal: M/S 5/5 throughout.  No deformity.  Neurologic: CN 2-12 intact. Pain and light touch intact in extremities.  Symmetrical.  Speech is fluent. Motor exam as listed above. Psychiatric: Judgment intact, Mood & affect appropriate for pt's clinical situation. Dermatologic: Venous rashes with several ulcers noted.  No changes consistent with cellulitis. Lymph : No lichenification or skin changes of chronic lymphedema.  CBC Lab Results  Component Value Date   WBC 5.4 08/19/2021   HGB 12.3 (L) 08/19/2021   HCT 36.6 (L) 08/19/2021   MCV 100.8 (H) 08/19/2021   PLT 170 08/19/2021    BMET    Component Value Date/Time   NA 138 08/19/2021 1255   K 3.6  08/19/2021 1255   CL 104 08/19/2021 1255   CO2 24 08/19/2021 1255   GLUCOSE 109 (H) 08/19/2021 1255   BUN 18 08/19/2021 1255   CREATININE 1.35 (H) 08/19/2021 1255   CALCIUM 9.0 08/19/2021 1255   GFRNONAA 49 (L) 02/04/2020 0556   GFRAA 56 (L) 02/04/2020 0556   CrCl cannot be calculated (Patient's most recent lab result is older than the maximum 21 days allowed.).  COAG Lab Results  Component Value Date   INR 1.2 02/03/2020    Radiology No results found.   Assessment/Plan 1. 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    I have had a long discussion with the patient regarding venous insufficiency and why it  causes symptoms, specifically venous ulceration. I have discussed with the patient the chronic skin changes that accompany venous insufficiency and the long term sequela such as infection and recurring  ulceration.  Patient will be placed in [employer] which will be changed weekly drainage permitting.  In addition, behavioral modification including several periods of elevation of the lower extremities during the day will be continued. Achieving a position with the ankles at heart level was stressed to the patient  The patient is instructed to begin routine exercise, especially walking on a daily basis  In the future the patient can be assessed for graduated compression stockings or wraps as well as a Lymph Pump once the ulcers are healed.  2. Chronic venous insufficiency Recommend  I have reviewed my previous  discussion with the patient regarding  varicose veins and why they cause symptoms. Patient will continue  wearing graduated compression stockings class 1 on a daily basis, beginning first thing in the morning and removing them in the evening.  The patient is CEAP C3sEpAsPr.  The patient has been wearing compression for more than 12 weeks with no or little benefit.  The patient  has been exercising daily for more than 12 weeks. The patient has been elevating and taking OTC pain medications for more than 12 weeks.  None of these have have eliminated the pain related to the varicose veins and venous reflux or the discomfort regarding venous congestion.  In addition, behavioral modification including elevation during the day was again discussed and this will continue.  The patient has utilized over the counter pain medications and has been exercising.  However, at this time conservative therapy has not alleviated the patient's symptoms of leg pain and swelling  Recommend: laser ablation of the left great saphenous veins to eliminate the symptoms of pain and swelling of the lower extremities in association with his recurring venous ulceration caused by the severe superficial venous reflux disease.   3. Lymphedema Recommend:  No surgery or intervention at this point in time.   The Patient is CEAP C4sEpAsPr.  The patient has been wearing compression for more than 12 weeks with no or little benefit.  The patient has been exercising daily for more than 12 weeks. The patient has been elevating and taking OTC pain medications for more than 12 weeks.  None of these have have eliminated the pain related to the lymphedema or the discomfort regarding excessive swelling and venous congestion.    I have reviewed my discussion with the patient regarding lymphedema and why it  causes symptoms.  Patient will continue wearing graduated compression on a daily basis. The patient should put the compression on first thing in the morning and removing them in the evening. The patient should not sleep in the compression.   In addition, behavioral modification throughout the day will be continued.  This will include frequent elevation (such as in a recliner), use of over the counter pain medications as needed and exercise such as walking.  The systemic causes for chronic edema such as liver,  kidney and cardiac etiologies do not appear to have significant changed over the past year.    The patient has chronic , severe lymphedema with hyperpigmentation of the skin and has done MLD, skin care, medication, diet, exercise, elevation and compression for 4 weeks with no improvement,  I am recommending a lymphedema pump.  The patient still has stage 3 lymphedema and therefore, I believe that a lymph pump is needed to improve the control of the patient's lymphedema and improve the quality of life.  Additionally, a lymph pump is warranted because it will reduce the risk of cellulitis and ulceration in the future.  Patient should follow-up in six months   4. Benign essential HTN Continue antihypertensive medications as already ordered, these medications have been reviewed and there are no changes at this time.  5. Persistent atrial fibrillation (HCC) Continue antiarrhythmia medications as already ordered, these medications have been reviewed and there are no changes at this time.  Continue anticoagulation as ordered by Cardiology Service d   Levora Dredge, MD  09/28/2023 11:16 AM

## 2023-10-05 ENCOUNTER — Encounter (INDEPENDENT_AMBULATORY_CARE_PROVIDER_SITE_OTHER): Payer: Self-pay | Admitting: Nurse Practitioner

## 2023-10-05 ENCOUNTER — Ambulatory Visit (INDEPENDENT_AMBULATORY_CARE_PROVIDER_SITE_OTHER): Payer: Medicare Other | Admitting: Nurse Practitioner

## 2023-10-05 VITALS — BP 142/87 | HR 69 | Resp 16

## 2023-10-05 DIAGNOSIS — I83023 Varicose veins of left lower extremity with ulcer of ankle: Secondary | ICD-10-CM

## 2023-10-05 DIAGNOSIS — L97321 Non-pressure chronic ulcer of left ankle limited to breakdown of skin: Secondary | ICD-10-CM

## 2023-10-05 NOTE — Progress Notes (Signed)
History of Present Illness  There is no documented history at this time  Assessments & Plan   There are no diagnoses linked to this encounter.    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    Procedure:  3 layer unna wrap was placed Left lower extremity.   Plan:   Follow up in one week.  

## 2023-10-12 ENCOUNTER — Encounter (INDEPENDENT_AMBULATORY_CARE_PROVIDER_SITE_OTHER): Payer: Self-pay | Admitting: Nurse Practitioner

## 2023-10-12 ENCOUNTER — Ambulatory Visit (INDEPENDENT_AMBULATORY_CARE_PROVIDER_SITE_OTHER): Payer: Medicare Other | Admitting: Nurse Practitioner

## 2023-10-12 VITALS — BP 134/83 | HR 71 | Resp 16 | Wt 246.8 lb

## 2023-10-12 DIAGNOSIS — I83023 Varicose veins of left lower extremity with ulcer of ankle: Secondary | ICD-10-CM | POA: Diagnosis not present

## 2023-10-12 DIAGNOSIS — L97321 Non-pressure chronic ulcer of left ankle limited to breakdown of skin: Secondary | ICD-10-CM

## 2023-10-12 NOTE — Progress Notes (Signed)
History of Present Illness  There is no documented history at this time  Assessments & Plan   There are no diagnoses linked to this encounter.    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    Procedure:  3 layer unna wrap was placed Left lower extremity.   Plan:   Follow up in one week.  

## 2023-10-19 ENCOUNTER — Encounter (INDEPENDENT_AMBULATORY_CARE_PROVIDER_SITE_OTHER): Payer: Self-pay

## 2023-10-19 ENCOUNTER — Ambulatory Visit (INDEPENDENT_AMBULATORY_CARE_PROVIDER_SITE_OTHER): Payer: Medicare Other | Admitting: Nurse Practitioner

## 2023-10-19 VITALS — BP 159/83 | HR 64 | Resp 18 | Ht 72.0 in | Wt 246.0 lb

## 2023-10-19 DIAGNOSIS — L97321 Non-pressure chronic ulcer of left ankle limited to breakdown of skin: Secondary | ICD-10-CM | POA: Diagnosis not present

## 2023-10-19 DIAGNOSIS — I83023 Varicose veins of left lower extremity with ulcer of ankle: Secondary | ICD-10-CM

## 2023-10-19 NOTE — Progress Notes (Signed)
History of Present Illness  There is no documented history at this time  Assessments & Plan   There are no diagnoses linked to this encounter.    Additional instructions  Subjective:  Patient presents with venous ulcer of the Bilateral lower extremity.    Procedure:  3 layer unna wrap was placed Bilateral lower extremity.   Plan:   Follow up in one week.  Patient is now in bilateral wrap. Right leg has developed and small ulcer in the later aspect of the shin. Right leg covered with zinc wraps and left leg has Aquacel cover 3 small places on the lower lateral leg.   1 week follow up

## 2023-10-26 ENCOUNTER — Encounter (INDEPENDENT_AMBULATORY_CARE_PROVIDER_SITE_OTHER): Payer: Self-pay

## 2023-10-26 ENCOUNTER — Ambulatory Visit (INDEPENDENT_AMBULATORY_CARE_PROVIDER_SITE_OTHER): Payer: Medicare Other | Admitting: Nurse Practitioner

## 2023-10-26 VITALS — BP 127/81 | HR 61 | Resp 16

## 2023-10-26 DIAGNOSIS — I83023 Varicose veins of left lower extremity with ulcer of ankle: Secondary | ICD-10-CM | POA: Diagnosis not present

## 2023-10-26 DIAGNOSIS — L97321 Non-pressure chronic ulcer of left ankle limited to breakdown of skin: Secondary | ICD-10-CM

## 2023-10-26 NOTE — Progress Notes (Signed)
History of Present Illness  There is no documented history at this time  Assessments & Plan   There are no diagnoses linked to this encounter.    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    Procedure:  3 layer unna wrap was placed Left lower extremity.   Plan:   Follow up in one week.  

## 2023-11-02 ENCOUNTER — Ambulatory Visit (INDEPENDENT_AMBULATORY_CARE_PROVIDER_SITE_OTHER): Payer: Medicare Other | Admitting: Nurse Practitioner

## 2023-11-02 ENCOUNTER — Encounter (INDEPENDENT_AMBULATORY_CARE_PROVIDER_SITE_OTHER): Payer: Self-pay

## 2023-11-02 VITALS — BP 131/78 | HR 78 | Resp 18 | Ht 72.0 in | Wt 246.0 lb

## 2023-11-02 DIAGNOSIS — I83023 Varicose veins of left lower extremity with ulcer of ankle: Secondary | ICD-10-CM | POA: Diagnosis not present

## 2023-11-02 DIAGNOSIS — L97321 Non-pressure chronic ulcer of left ankle limited to breakdown of skin: Secondary | ICD-10-CM

## 2023-11-02 NOTE — Progress Notes (Signed)
History of Present Illness  There is no documented history at this time  Assessments & Plan   There are no diagnoses linked to this encounter.    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    Procedure:  3 layer unna wrap was placed Left lower extremity.   Plan:   Follow up in one week.  

## 2023-11-09 ENCOUNTER — Ambulatory Visit (INDEPENDENT_AMBULATORY_CARE_PROVIDER_SITE_OTHER): Payer: Medicare Other | Admitting: Vascular Surgery

## 2023-11-09 ENCOUNTER — Encounter (INDEPENDENT_AMBULATORY_CARE_PROVIDER_SITE_OTHER): Payer: Self-pay | Admitting: Vascular Surgery

## 2023-11-09 VITALS — BP 124/75 | HR 62 | Resp 16 | Wt 247.6 lb

## 2023-11-09 DIAGNOSIS — I4819 Other persistent atrial fibrillation: Secondary | ICD-10-CM

## 2023-11-09 DIAGNOSIS — L97321 Non-pressure chronic ulcer of left ankle limited to breakdown of skin: Secondary | ICD-10-CM | POA: Diagnosis not present

## 2023-11-09 DIAGNOSIS — I872 Venous insufficiency (chronic) (peripheral): Secondary | ICD-10-CM | POA: Diagnosis not present

## 2023-11-09 DIAGNOSIS — I83023 Varicose veins of left lower extremity with ulcer of ankle: Secondary | ICD-10-CM | POA: Diagnosis not present

## 2023-11-09 DIAGNOSIS — I10 Essential (primary) hypertension: Secondary | ICD-10-CM | POA: Diagnosis not present

## 2023-11-09 NOTE — Progress Notes (Unsigned)
MRN : [id number]  Mark Blevins is a 82 y.o. (26-Sep-1941) male who presents with chief complaint of legs hurt and swell.  History of Present Illness: ***  Current Meds  Medication Sig   amLODipine (NORVASC) 5 MG tablet TAKE 1 TABLET(5 MG) BY MOUTH DAILY   apixaban (ELIQUIS) 5 MG TABS tablet Take 1 tablet (5 mg total) by mouth 2 (two) times daily.   cloNIDine (CATAPRES) 0.1 MG tablet Take 1 tablet (0.1 mg total) by mouth daily.   labetalol (NORMODYNE) 200 MG tablet Take 1 tablet (200 mg total) by mouth daily.   Multiple Vitamin (MULTIVITAMIN WITH MINERALS) TABS tablet Take 1 tablet by mouth daily.   silver sulfADIAZINE (SILVADENE) 1 % cream Apply 1 Application topically daily.    Past Medical History:  Diagnosis Date   Aortic valve stenosis    moderate (TTE 2022)   Atrial fibrillation (HCC)    BPH (benign prostatic hyperplasia)    Hypertension    Poor circulation of extremity    left leg   Venous reflux    Wears dentures    Full upper and lower    Past Surgical History:  Procedure Laterality Date   APPENDECTOMY     BACK SURGERY     CATARACT EXTRACTION W/PHACO Left 02/05/2022   Procedure: CATARACT EXTRACTION PHACO AND INTRAOCULAR LENS PLACEMENT (IOC) LEFT MALYUGIN 19.75 01:59.8;  Surgeon: Lockie Mola, MD;  Location: [facility];  Service: Ophthalmology;  Laterality: Left;   CATARACT EXTRACTION W/PHACO Right 02/19/2022   Procedure: CATARACT EXTRACTION PHACO AND INTRAOCULAR LENS PLACEMENT (IOC) RIGHT MALYUGIN 1900 01:55.6;  Surgeon: Lockie Mola, MD;  Location: [facility];  Service: Ophthalmology;  Laterality: Right;   COLECTOMY N/A 04/27/2019   Procedure: COLECTOMY WITH COLOSTOMY;  Surgeon: Sung Amabile, DO;  Location: [facility];  Service: General;  Laterality: N/A;   COLONOSCOPY WITH PROPOFOL N/A 04/24/2019   Procedure: COLONOSCOPY WITH PROPOFOL;  Surgeon: Sung Amabile, DO;  Location: [facility];  Service: General;  Laterality: N/A;    LAPAROSCOPIC LYSIS OF ADHESIONS  05/06/2019   Procedure: LAPAROSCOPIC LYSIS OF ADHESIONS;  Surgeon: Sung Amabile, DO;  Location: [facility];  Service: General;;   LAPAROSCOPY N/A 05/06/2019   Procedure: LAPAROSCOPY DIAGNOSTIC;  Surgeon: Sung Amabile, DO;  Location: [facility];  Service: General;  Laterality: N/A;   REPLACEMENT TOTAL KNEE BILATERAL     XI ROBOTIC ASSISTED COLOSTOMY TAKEDOWN N/A 02/03/2020   Procedure: XI ROBOTIC ASSISTED COLOSTOMY TAKEDOWN;  Surgeon: Sung Amabile, DO;  Location: [facility];  Service: General;  Laterality: N/A;    Social History Social History   Tobacco Use   Smoking status: Former    Current packs/day: 0.00    Types: Cigarettes    Quit date: 1971    Years since quitting: 54.0   Smokeless tobacco: Current    Types: Chew   Tobacco comments:    quit smoking 1969 or 1970  Vaping Use   Vaping status: Never Used  Substance Use Topics   Alcohol use: Yes    Alcohol/week: 24.0 standard drinks of alcohol    Types: 24 Cans of beer per week    Comment: 3-4 per day   Drug use: Never    Family History No family history on file.  Allergies  Allergen Reactions   Codeine Nausea And Vomiting   Morphine And Codeine Nausea And Vomiting     REVIEW OF SYSTEMS (Negative unless checked)  Constitutional: [] Weight loss  [] Fever  []   Chills Cardiac: [] Chest pain   [] Chest pressure   [] Palpitations   [] Shortness of breath when laying flat   [] Shortness of breath with exertion. Vascular:  [] Pain in legs with walking   [x] Pain in legs at rest  [] History of DVT   [] Phlebitis   [x] Swelling in legs   [] Varicose veins   [] Non-healing ulcers Pulmonary:   [] Uses home oxygen   [] Productive cough   [] Hemoptysis   [] Wheeze  [] COPD   [] Asthma Neurologic:  [] Dizziness   [] Seizures   [] History of stroke   [] History of TIA  [] Aphasia   [] Vissual changes   [] Weakness or numbness in arm   [] Weakness or numbness in leg Musculoskeletal:   [] Joint swelling   [] Joint pain   [] Low back  pain Hematologic:  [] Easy bruising  [] Easy bleeding   [] Hypercoagulable state   [] Anemic Gastrointestinal:  [] Diarrhea   [] Vomiting  [] Gastroesophageal reflux/heartburn   [] Difficulty swallowing. Genitourinary:  [] Chronic kidney disease   [] Difficult urination  [] Frequent urination   [] Blood in urine Skin:  [] Rashes   [] Ulcers  Psychological:  [] History of anxiety   []  History of major depression.  Physical Examination  Vitals:   11/09/23 0853  BP: 124/75  Pulse: 62  Resp: 16  Weight: 247 lb 9.6 oz (112.3 kg)   Body mass index is 33.58 kg/m. Gen: WD/WN, NAD Head: [place]/AT, No temporalis wasting.  Ear/Nose/Throat: Hearing grossly intact, nares w/o erythema or drainage, pinna without lesions Eyes: PER, EOMI, sclera nonicteric.  Neck: Supple, no gross masses.  No JVD.  Pulmonary:  Good air movement, no audible wheezing, no use of accessory muscles.  Cardiac: RRR, precordium not hyperdynamic. Vascular:  scattered varicosities present bilaterally.  Moderate venous stasis changes to the legs bilaterally.  2+ soft pitting edema. CEAP C4sEpAsPr   Vessel Right Left  Radial Palpable Palpable  Gastrointestinal: soft, non-distended. No guarding/no peritoneal signs.  Musculoskeletal: M/S 5/5 throughout.  No deformity.  Neurologic: CN 2-12 intact. Pain and light touch intact in extremities.  Symmetrical.  Speech is fluent. Motor exam as listed above. Psychiatric: Judgment intact, Mood & affect appropriate for pt's clinical situation. Dermatologic: Venous rashes no ulcers noted.  No changes consistent with cellulitis. Lymph : No lichenification or skin changes of chronic lymphedema.  CBC Lab Results  Component Value Date   WBC 5.4 08/19/2021   HGB 12.3 (L) 08/19/2021   HCT 36.6 (L) 08/19/2021   MCV 100.8 (H) 08/19/2021   PLT 170 08/19/2021    BMET    Component Value Date/Time   NA 138 08/19/2021 1255   K 3.6 08/19/2021 1255   CL 104 08/19/2021 1255   CO2 24 08/19/2021 1255   GLUCOSE  109 (H) 08/19/2021 1255   BUN 18 08/19/2021 1255   CREATININE 1.35 (H) 08/19/2021 1255   CALCIUM 9.0 08/19/2021 1255   GFRNONAA 49 (L) 02/04/2020 0556   GFRAA 56 (L) 02/04/2020 0556   CrCl cannot be calculated (Patient's most recent lab result is older than the maximum 21 days allowed.).  COAG Lab Results  Component Value Date   INR 1.2 02/03/2020    Radiology No results found.   Assessment/Plan There are no diagnoses linked to this encounter.   Levora Dredge, MD  11/09/2023 9:01 AM

## 2023-11-12 ENCOUNTER — Encounter (INDEPENDENT_AMBULATORY_CARE_PROVIDER_SITE_OTHER): Payer: Self-pay | Admitting: Vascular Surgery

## 2023-11-16 ENCOUNTER — Encounter (INDEPENDENT_AMBULATORY_CARE_PROVIDER_SITE_OTHER): Payer: Self-pay

## 2023-11-16 ENCOUNTER — Ambulatory Visit (INDEPENDENT_AMBULATORY_CARE_PROVIDER_SITE_OTHER): Payer: Medicare Other | Admitting: Nurse Practitioner

## 2023-11-16 VITALS — BP 177/83 | HR 68 | Resp 18 | Ht 72.0 in | Wt 247.0 lb

## 2023-11-16 DIAGNOSIS — L97321 Non-pressure chronic ulcer of left ankle limited to breakdown of skin: Secondary | ICD-10-CM

## 2023-11-16 DIAGNOSIS — I83023 Varicose veins of left lower extremity with ulcer of ankle: Secondary | ICD-10-CM

## 2023-11-16 NOTE — Progress Notes (Signed)
 History of Present Illness  There is no documented history at this time  Assessments & Plan   There are no diagnoses linked to this encounter.    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    Procedure:  3 layer unna wrap was placed Left lower extremity.   Plan:   Follow up in one week. An

## 2023-11-23 ENCOUNTER — Encounter (INDEPENDENT_AMBULATORY_CARE_PROVIDER_SITE_OTHER): Payer: Self-pay | Admitting: Nurse Practitioner

## 2023-11-23 ENCOUNTER — Ambulatory Visit (INDEPENDENT_AMBULATORY_CARE_PROVIDER_SITE_OTHER): Payer: Medicare Other | Admitting: Nurse Practitioner

## 2023-11-23 DIAGNOSIS — L97321 Non-pressure chronic ulcer of left ankle limited to breakdown of skin: Secondary | ICD-10-CM

## 2023-11-23 DIAGNOSIS — I83023 Varicose veins of left lower extremity with ulcer of ankle: Secondary | ICD-10-CM

## 2023-11-23 NOTE — Progress Notes (Signed)
 Patient denied unna wraps. He stated he will come back in if he can't reduce the swelling with the compression socks and the use of the lymph pumps.   Per [employer] he can remove them and call if needed.   Patient was wrapped with Kerlix and coban until tonight. He can removed them and use his stockings in the morning.

## 2023-11-26 ENCOUNTER — Encounter (INDEPENDENT_AMBULATORY_CARE_PROVIDER_SITE_OTHER): Payer: Self-pay | Admitting: Nurse Practitioner

## 2023-11-26 ENCOUNTER — Ambulatory Visit (INDEPENDENT_AMBULATORY_CARE_PROVIDER_SITE_OTHER): Payer: Medicare Other | Admitting: Nurse Practitioner

## 2023-11-26 VITALS — BP 149/98 | HR 89 | Resp 18 | Ht 73.0 in | Wt 247.0 lb

## 2023-11-26 DIAGNOSIS — I83023 Varicose veins of left lower extremity with ulcer of ankle: Secondary | ICD-10-CM

## 2023-11-26 DIAGNOSIS — L97321 Non-pressure chronic ulcer of left ankle limited to breakdown of skin: Secondary | ICD-10-CM | POA: Diagnosis not present

## 2023-11-26 NOTE — Progress Notes (Signed)
History of Present Illness  There is no documented history at this time  Assessments & Plan   There are no diagnoses linked to this encounter.    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    Procedure:  3 layer unna wrap was placed Left lower extremity.   Plan:   Follow up in one week.   Patient requested to be removed from wraps on 11/23/23. With the understanding on wearing the coban wrap all day from his visit until he night time. The next morning he was to start wearing his compression socks.  Patient called requesting to be wrapped because of multiple blisters forming and bursting, causing his socks and shoe to get wet. Patient also has SOB while resting, worse when walking long distances. I recommended that patient would go to the ED after getting wrapped today, he stated that he was planning on it this morning but wanted to wait til tomorrow after he see's his PCP.   Left lower leg has Xeroform in varies areas to cover the blisters.

## 2023-12-03 ENCOUNTER — Encounter (INDEPENDENT_AMBULATORY_CARE_PROVIDER_SITE_OTHER): Payer: Medicare Other

## 2023-12-03 ENCOUNTER — Telehealth (INDEPENDENT_AMBULATORY_CARE_PROVIDER_SITE_OTHER): Payer: Self-pay | Admitting: Vascular Surgery

## 2023-12-03 ENCOUNTER — Ambulatory Visit (INDEPENDENT_AMBULATORY_CARE_PROVIDER_SITE_OTHER): Payer: Medicare Other | Admitting: Nurse Practitioner

## 2023-12-03 ENCOUNTER — Encounter (INDEPENDENT_AMBULATORY_CARE_PROVIDER_SITE_OTHER): Payer: Self-pay

## 2023-12-03 VITALS — BP 118/77 | HR 72 | Resp 16 | Wt 255.2 lb

## 2023-12-03 DIAGNOSIS — I83023 Varicose veins of left lower extremity with ulcer of ankle: Secondary | ICD-10-CM

## 2023-12-03 DIAGNOSIS — L97321 Non-pressure chronic ulcer of left ankle limited to breakdown of skin: Secondary | ICD-10-CM

## 2023-12-03 NOTE — Telephone Encounter (Signed)
Patient scheduled laser ablation for 1.30.25 and will need a standard protocol RX called into [facility] on [address]. Thank you!

## 2023-12-03 NOTE — Telephone Encounter (Signed)
 Prescription has been called into pharmacy

## 2023-12-03 NOTE — Progress Notes (Signed)
History of Present Illness  There is no documented history at this time  Assessments & Plan   There are no diagnoses linked to this encounter.    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    Procedure:  3 layer unna wrap was placed Left lower extremity.   Plan:   Follow up in one week.  

## 2023-12-09 ENCOUNTER — Encounter (INDEPENDENT_AMBULATORY_CARE_PROVIDER_SITE_OTHER): Payer: Self-pay | Admitting: Nurse Practitioner

## 2023-12-09 DIAGNOSIS — I8310 Varicose veins of unspecified lower extremity with inflammation: Secondary | ICD-10-CM | POA: Insufficient documentation

## 2023-12-09 NOTE — Progress Notes (Unsigned)
    MRN : [id number]  Mark Blevins is a 83 y.o. ([birth year]) male who presents with chief complaint of No chief complaint on file. .    The patient's left lower extremity was sterilely prepped and draped.  The ultrasound machine was used to visualize the left great saphenous vein throughout its course.  A segment at the knee was selected for access.  The saphenous vein was accessed without difficulty using ultrasound guidance with a micropuncture needle.   An 0.018  wire was placed beyond the saphenofemoral junction through the sheath and the microneedle was removed.  The 65 cm sheath was then placed over the wire and the wire and dilator were removed.  The laser fiber was placed through the sheath and its tip was placed approximately 2 cm below the saphenofemoral junction.  Tumescent anesthesia was then created with a dilute lidocaine solution.  Laser energy was then delivered with constant withdrawal of the sheath and laser fiber.  Approximately 2187 Joules of energy were delivered over a length of 48 cm.  Sterile dressings were placed.  The patient tolerated the procedure well without complications.

## 2023-12-10 ENCOUNTER — Encounter (INDEPENDENT_AMBULATORY_CARE_PROVIDER_SITE_OTHER): Payer: Medicare Other

## 2023-12-10 ENCOUNTER — Encounter (INDEPENDENT_AMBULATORY_CARE_PROVIDER_SITE_OTHER): Payer: Self-pay | Admitting: Vascular Surgery

## 2023-12-10 ENCOUNTER — Ambulatory Visit (INDEPENDENT_AMBULATORY_CARE_PROVIDER_SITE_OTHER): Payer: Medicare Other | Admitting: Vascular Surgery

## 2023-12-10 DIAGNOSIS — I8310 Varicose veins of unspecified lower extremity with inflammation: Secondary | ICD-10-CM

## 2023-12-10 DIAGNOSIS — I8312 Varicose veins of left lower extremity with inflammation: Secondary | ICD-10-CM | POA: Diagnosis not present

## 2023-12-15 ENCOUNTER — Other Ambulatory Visit (INDEPENDENT_AMBULATORY_CARE_PROVIDER_SITE_OTHER): Payer: Self-pay | Admitting: Vascular Surgery

## 2023-12-15 DIAGNOSIS — I8310 Varicose veins of unspecified lower extremity with inflammation: Secondary | ICD-10-CM

## 2023-12-17 ENCOUNTER — Other Ambulatory Visit (INDEPENDENT_AMBULATORY_CARE_PROVIDER_SITE_OTHER): Payer: Medicare Other

## 2023-12-17 ENCOUNTER — Encounter (INDEPENDENT_AMBULATORY_CARE_PROVIDER_SITE_OTHER): Payer: Medicare Other

## 2023-12-17 DIAGNOSIS — I8312 Varicose veins of left lower extremity with inflammation: Secondary | ICD-10-CM

## 2023-12-17 DIAGNOSIS — I8310 Varicose veins of unspecified lower extremity with inflammation: Secondary | ICD-10-CM

## 2023-12-24 ENCOUNTER — Encounter (INDEPENDENT_AMBULATORY_CARE_PROVIDER_SITE_OTHER): Payer: Self-pay

## 2023-12-24 ENCOUNTER — Ambulatory Visit (INDEPENDENT_AMBULATORY_CARE_PROVIDER_SITE_OTHER): Payer: Medicare Other | Admitting: Nurse Practitioner

## 2023-12-24 VITALS — BP 104/65 | HR 75 | Resp 18 | Ht 73.0 in | Wt 255.0 lb

## 2023-12-24 DIAGNOSIS — I8310 Varicose veins of unspecified lower extremity with inflammation: Secondary | ICD-10-CM

## 2023-12-24 DIAGNOSIS — I8311 Varicose veins of right lower extremity with inflammation: Secondary | ICD-10-CM

## 2023-12-24 NOTE — Progress Notes (Signed)
History of Present Illness  There is no documented history at this time  Assessments & Plan   There are no diagnoses linked to this encounter.    Additional instructions  Subjective:  Patient presents with venous ulcer of the Right lower extremity.    Procedure:  3 layer unna wrap was placed Right lower extremity.   Plan:   Follow up in one week.  Xeroform lower left posterior leg  Patient has shortness of breath from fluid over load. Advised to go to the ED.

## 2023-12-31 ENCOUNTER — Encounter (INDEPENDENT_AMBULATORY_CARE_PROVIDER_SITE_OTHER): Payer: Medicare Other

## 2024-01-07 ENCOUNTER — Ambulatory Visit (INDEPENDENT_AMBULATORY_CARE_PROVIDER_SITE_OTHER): Payer: Medicare Other | Admitting: Nurse Practitioner

## 2024-01-07 ENCOUNTER — Encounter (INDEPENDENT_AMBULATORY_CARE_PROVIDER_SITE_OTHER): Payer: Self-pay

## 2024-01-07 VITALS — BP 119/71 | HR 74 | Resp 18

## 2024-01-07 DIAGNOSIS — L97321 Non-pressure chronic ulcer of left ankle limited to breakdown of skin: Secondary | ICD-10-CM

## 2024-01-07 DIAGNOSIS — I83023 Varicose veins of left lower extremity with ulcer of ankle: Secondary | ICD-10-CM | POA: Diagnosis not present

## 2024-01-07 NOTE — Progress Notes (Signed)
 History of Present Illness  There is no documented history at this time  Assessments & Plan   There are no diagnoses linked to this encounter.    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    Procedure:  3 layer unna wrap was placed Left lower extremity.   Plan:   Follow up in one week.  Patient present worsening edema on all extremities. Patient was advise to be seen at ED for further evaluation.

## 2024-01-14 ENCOUNTER — Other Ambulatory Visit: Payer: Self-pay

## 2024-01-14 ENCOUNTER — Encounter: Payer: Self-pay | Admitting: Emergency Medicine

## 2024-01-14 ENCOUNTER — Emergency Department

## 2024-01-14 ENCOUNTER — Inpatient Hospital Stay
Admit: 2024-01-14 | Discharge: 2024-01-14 | Disposition: A | Discharge status: Home / Self Care | Attending: Student | Admitting: Student

## 2024-01-14 ENCOUNTER — Inpatient Hospital Stay
Admission: EM | Admit: 2024-01-14 | Discharge: 2024-01-22 | DRG: 291 | Disposition: A | Discharge status: Skilled Nursing Facility | Attending: Internal Medicine | Admitting: Internal Medicine

## 2024-01-14 ENCOUNTER — Other Ambulatory Visit

## 2024-01-14 DIAGNOSIS — I5043 Acute on chronic combined systolic (congestive) and diastolic (congestive) heart failure: Secondary | ICD-10-CM | POA: Diagnosis present

## 2024-01-14 DIAGNOSIS — Z1152 Encounter for screening for COVID-19: Secondary | ICD-10-CM | POA: Diagnosis not present

## 2024-01-14 DIAGNOSIS — J449 Chronic obstructive pulmonary disease, unspecified: Secondary | ICD-10-CM | POA: Diagnosis present

## 2024-01-14 DIAGNOSIS — E876 Hypokalemia: Secondary | ICD-10-CM | POA: Diagnosis present

## 2024-01-14 DIAGNOSIS — I2510 Atherosclerotic heart disease of native coronary artery without angina pectoris: Secondary | ICD-10-CM | POA: Diagnosis present

## 2024-01-14 DIAGNOSIS — Z87891 Personal history of nicotine dependence: Secondary | ICD-10-CM | POA: Diagnosis not present

## 2024-01-14 DIAGNOSIS — I509 Heart failure, unspecified: Principal | ICD-10-CM | POA: Insufficient documentation

## 2024-01-14 DIAGNOSIS — N401 Enlarged prostate with lower urinary tract symptoms: Secondary | ICD-10-CM | POA: Diagnosis present

## 2024-01-14 DIAGNOSIS — N400 Enlarged prostate without lower urinary tract symptoms: Secondary | ICD-10-CM

## 2024-01-14 DIAGNOSIS — Z885 Allergy status to narcotic agent status: Secondary | ICD-10-CM

## 2024-01-14 DIAGNOSIS — I493 Ventricular premature depolarization: Secondary | ICD-10-CM | POA: Diagnosis present

## 2024-01-14 DIAGNOSIS — I4819 Other persistent atrial fibrillation: Secondary | ICD-10-CM | POA: Diagnosis present

## 2024-01-14 DIAGNOSIS — Z751 Person awaiting admission to adequate facility elsewhere: Secondary | ICD-10-CM

## 2024-01-14 DIAGNOSIS — I350 Nonrheumatic aortic (valve) stenosis: Secondary | ICD-10-CM | POA: Diagnosis present

## 2024-01-14 DIAGNOSIS — I480 Paroxysmal atrial fibrillation: Secondary | ICD-10-CM | POA: Diagnosis present

## 2024-01-14 DIAGNOSIS — Z79899 Other long term (current) drug therapy: Secondary | ICD-10-CM

## 2024-01-14 DIAGNOSIS — N182 Chronic kidney disease, stage 2 (mild): Secondary | ICD-10-CM | POA: Diagnosis present

## 2024-01-14 DIAGNOSIS — R338 Other retention of urine: Secondary | ICD-10-CM | POA: Diagnosis not present

## 2024-01-14 DIAGNOSIS — E785 Hyperlipidemia, unspecified: Secondary | ICD-10-CM | POA: Diagnosis present

## 2024-01-14 DIAGNOSIS — I10 Essential (primary) hypertension: Secondary | ICD-10-CM | POA: Diagnosis not present

## 2024-01-14 DIAGNOSIS — I5033 Acute on chronic diastolic (congestive) heart failure: Secondary | ICD-10-CM | POA: Insufficient documentation

## 2024-01-14 DIAGNOSIS — Z7901 Long term (current) use of anticoagulants: Secondary | ICD-10-CM

## 2024-01-14 DIAGNOSIS — I130 Hypertensive heart and chronic kidney disease with heart failure and stage 1 through stage 4 chronic kidney disease, or unspecified chronic kidney disease: Secondary | ICD-10-CM | POA: Diagnosis present

## 2024-01-14 DIAGNOSIS — Z96653 Presence of artificial knee joint, bilateral: Secondary | ICD-10-CM | POA: Diagnosis present

## 2024-01-14 DIAGNOSIS — R5381 Other malaise: Secondary | ICD-10-CM | POA: Insufficient documentation

## 2024-01-14 LAB — BASIC METABOLIC PANEL
Anion gap: 10 (ref 5–15)
BUN: 19 mg/dL (ref 8–23)
CO2: 23 mmol/L (ref 22–32)
Calcium: 8.2 mg/dL — ABNORMAL LOW (ref 8.9–10.3)
Chloride: 106 mmol/L (ref 98–111)
Creatinine, Ser: 1.57 mg/dL — ABNORMAL HIGH (ref 0.61–1.24)
GFR, Estimated: 43 mL/min — ABNORMAL LOW (ref >60)
Glucose, Bld: 107 mg/dL — ABNORMAL HIGH (ref 70–99)
Potassium: 3 mmol/L — ABNORMAL LOW (ref 3.5–5.1)
Sodium: 139 mmol/L (ref 135–145)

## 2024-01-14 LAB — ECHOCARDIOGRAM COMPLETE
AR max vel: 0.51 cm2
AV Area VTI: 0.58 cm2
AV Area mean vel: 0.52 cm2
AV Mean grad: 29.8 mmHg
AV Peak grad: 53.7 mmHg
Ao pk vel: 3.66 m/s
Area-P 1/2: 5.58 cm2
Height: 72 in
MV VTI: 1.55 cm2
S' Lateral: 3.3 cm
Weight: 4000 [oz_av]

## 2024-01-14 LAB — BRAIN NATRIURETIC PEPTIDE: B Natriuretic Peptide: 935.6 pg/mL — ABNORMAL HIGH (ref 0.0–100.0)

## 2024-01-14 LAB — TROPONIN I (HIGH SENSITIVITY)
Troponin I (High Sensitivity): 34 ng/L — ABNORMAL HIGH (ref <18)
Troponin I (High Sensitivity): 32 ng/L — ABNORMAL HIGH (ref <18)

## 2024-01-14 LAB — HEPATIC FUNCTION PANEL
ALT: 17 U/L (ref 0–44)
AST: 28 U/L (ref 15–41)
Albumin: 3.2 g/dL — ABNORMAL LOW (ref 3.5–5.0)
Alkaline Phosphatase: 64 U/L (ref 38–126)
Bilirubin, Direct: 0.3 mg/dL — ABNORMAL HIGH (ref 0.0–0.2)
Indirect Bilirubin: 0.7 mg/dL (ref 0.3–0.9)
Total Bilirubin: 1 mg/dL (ref 0.0–1.2)
Total Protein: 6.2 g/dL — ABNORMAL LOW (ref 6.5–8.1)

## 2024-01-14 LAB — CBC
HCT: 31.1 % — ABNORMAL LOW (ref 39.0–52.0)
Hemoglobin: 10.2 g/dL — ABNORMAL LOW (ref 13.0–17.0)
MCH: 32.7 pg (ref 26.0–34.0)
MCHC: 32.8 g/dL (ref 30.0–36.0)
MCV: 99.7 fL (ref 80.0–100.0)
Platelets: 147 10*3/uL — ABNORMAL LOW (ref 150–400)
RBC: 3.12 MIL/uL — ABNORMAL LOW (ref 4.22–5.81)
RDW: 14.8 % (ref 11.5–15.5)
WBC: 3.4 10*3/uL — ABNORMAL LOW (ref 4.0–10.5)
nRBC: 0 % (ref 0.0–0.2)

## 2024-01-14 LAB — PHOSPHORUS: Phosphorus: 3.3 mg/dL (ref 2.5–4.6)

## 2024-01-14 LAB — MAGNESIUM: Magnesium: 2 mg/dL (ref 1.7–2.4)

## 2024-01-14 LAB — TSH: TSH: 2.858 u[IU]/mL (ref 0.350–4.500)

## 2024-01-14 MED ORDER — APIXABAN 5 MG PO TABS
5.0000 mg | ORAL_TABLET | Freq: Two times a day (BID) | ORAL | Status: DC
  Administered 2024-01-14 – 2024-01-22 (×16): 5 mg via ORAL
  Filled 2024-01-14 (×17): qty 1

## 2024-01-14 MED ORDER — POTASSIUM CHLORIDE CRYS ER 20 MEQ PO TBCR
40.0000 meq | EXTENDED_RELEASE_TABLET | ORAL | Status: AC
Start: 2024-01-14 — End: 2024-01-14
  Administered 2024-01-14 (×2): 40 meq via ORAL
  Filled 2024-01-14 (×2): qty 2

## 2024-01-14 MED ORDER — ACETAMINOPHEN 325 MG PO TABS: 650.0000 mg | ORAL_TABLET | ORAL | Status: DC | PRN

## 2024-01-14 MED ORDER — POTASSIUM CHLORIDE CRYS ER 10 MEQ PO TBCR
10.0000 meq | EXTENDED_RELEASE_TABLET | Freq: Every day | ORAL | Status: DC
  Administered 2024-01-15 – 2024-01-17 (×3): 10 meq via ORAL
  Filled 2024-01-14 (×3): qty 1

## 2024-01-14 MED ORDER — SODIUM CHLORIDE 0.9 % IV SOLN: 250.0000 mL | INTRAVENOUS | Status: AC | PRN

## 2024-01-14 MED ORDER — SODIUM CHLORIDE 0.9% FLUSH: 3.0000 mL | INTRAVENOUS | Status: DC | PRN

## 2024-01-14 MED ORDER — HYDRALAZINE HCL 20 MG/ML IJ SOLN: 5.0000 mg | Freq: Four times a day (QID) | INTRAMUSCULAR | Status: DC | PRN

## 2024-01-14 MED ORDER — CARVEDILOL 6.25 MG PO TABS
3.1250 mg | ORAL_TABLET | Freq: Two times a day (BID) | ORAL | Status: DC
  Administered 2024-01-14 – 2024-01-22 (×17): 3.125 mg via ORAL
  Filled 2024-01-14 (×17): qty 1

## 2024-01-14 MED ORDER — SPIRONOLACTONE 12.5 MG HALF TABLET
12.5000 mg | ORAL_TABLET | Freq: Every day | ORAL | Status: DC
  Administered 2024-01-14 – 2024-01-19 (×6): 12.5 mg via ORAL
  Filled 2024-01-14 (×6): qty 1

## 2024-01-14 MED ORDER — SODIUM CHLORIDE 0.9% FLUSH
3.0000 mL | Freq: Two times a day (BID) | INTRAVENOUS | Status: DC
  Administered 2024-01-14 – 2024-01-22 (×15): 3 mL via INTRAVENOUS

## 2024-01-14 MED ORDER — ONDANSETRON HCL 4 MG/2ML IJ SOLN: 4.0000 mg | Freq: Four times a day (QID) | INTRAMUSCULAR | Status: DC | PRN

## 2024-01-14 MED ORDER — FUROSEMIDE 10 MG/ML IJ SOLN
80.0000 mg | Freq: Once | INTRAMUSCULAR | Status: AC
  Administered 2024-01-14: 80 mg via INTRAVENOUS
  Filled 2024-01-14: qty 8

## 2024-01-14 MED ORDER — FUROSEMIDE 10 MG/ML IJ SOLN
40.0000 mg | Freq: Two times a day (BID) | INTRAMUSCULAR | Status: DC
  Administered 2024-01-14 – 2024-01-17 (×7): 40 mg via INTRAVENOUS
  Filled 2024-01-14 (×7): qty 4

## 2024-01-14 NOTE — ED Provider Notes (Signed)
 [facility] Provider Note    Event Date/Time   First MD Initiated Contact with Patient 01/14/24 [phone]     (approximate)   History   Shortness of Breath   HPI  Mark Blevins is a 83 y.o. male with a history of aortic stenosis, hypertension, and persistent atrial fibrillation on Eliquis who presents with worsening shortness of breath and swelling over the last few months, gradually worsened over the last several weeks.  The patient reports swelling to both lower legs as well as his lower abdomen, groin, scrotum, and penis.  He states that he was on a diuretic pill previously but does not think that it was helping so he has not taken it recently.  He reports shortness of breath with minimal exertion.  He also reports difficulty urinating due to the amount of swelling around his penis and scrotum.   I reviewed the past medical records.  The patient was most recently seen by cardiology in August 2023 also reporting leg edema at that time.  He was planned for an echocardiogram but I do not see that one was ever done since that time.   Physical Exam   Triage Vital Signs: ED Triage Vitals  Encounter Vitals Group     BP 01/14/24 0707 136/88     Systolic BP Percentile --      Diastolic BP Percentile --      Pulse Rate 01/14/24 0707 70     Resp 01/14/24 0707 (!) 26     Temp 01/14/24 0707 98.1 F (36.7 C)     Temp Source 01/14/24 0707 Oral     SpO2 01/14/24 0707 100 %     Weight 01/14/24 0705 250 lb (113.4 kg)     Height 01/14/24 0705 6' (1.829 m)     Head Circumference --      Peak Flow --      Pain Score 01/14/24 0704 0     Pain Loc --      Pain Education --      Exclude from Growth Chart --     Most recent vital signs: Vitals:   01/14/24 0900 01/14/24 0930  BP: (!) 148/76 136/78  Pulse: 69 74  Resp: 16 15  Temp:    SpO2: 96% 95%     General: Awake, no distress.  CV:  Good peripheral perfusion.  Resp:  Normal effort.  Diminished breath sounds  bilaterally. Abd:  No distention.  Other:  2+ bilateral lower extremity edema.  Significant edema to the lower abdomen, suprapubic area, penis, and scrotum.   ED Results / Procedures / Treatments   Labs (all labs ordered are listed, but only abnormal results are displayed) Labs Reviewed  BASIC METABOLIC PANEL - Abnormal; Notable for the following components:      Result Value   Potassium 3.0 (*)    Glucose, Bld 107 (*)    Creatinine, Ser 1.57 (*)    Calcium 8.2 (*)    GFR, Estimated 43 (*)    All other components within normal limits  CBC - Abnormal; Notable for the following components:   WBC 3.4 (*)    RBC 3.12 (*)    Hemoglobin 10.2 (*)    HCT 31.1 (*)    Platelets 147 (*)    All other components within normal limits  BRAIN NATRIURETIC PEPTIDE - Abnormal; Notable for the following components:   B Natriuretic Peptide 935.6 (*)    All other components within normal  limits  HEPATIC FUNCTION PANEL - Abnormal; Notable for the following components:   Total Protein 6.2 (*)    Albumin 3.2 (*)    Bilirubin, Direct 0.3 (*)    All other components within normal limits  TROPONIN I (HIGH SENSITIVITY) - Abnormal; Notable for the following components:   Troponin I (High Sensitivity) 34 (*)    All other components within normal limits  TROPONIN I (HIGH SENSITIVITY)     EKG  ED ECG REPORT I, Dionne Bucy, the attending physician, personally viewed and interpreted this ECG.  Date: 01/14/2024 EKG Time: 0711 Rate: 77 Rhythm: Atrial fibrillation QRS Axis: normal Intervals: normal ST/T Wave abnormalities: Nonspecific ST abnormalities Narrative Interpretation: no evidence of acute ischemia    RADIOLOGY  Chest x-ray: I independently viewed and interpreted the images; there is bilateral interstitial edema and cardiomegaly   PROCEDURES:  Critical Care performed: No  Procedures   MEDICATIONS ORDERED IN ED: Medications  furosemide (LASIX) injection 80 mg (80 mg  Intravenous Given 01/14/24 0832)     IMPRESSION / MDM / ASSESSMENT AND PLAN / ED COURSE  I reviewed the triage vital signs and the nursing notes.  83 year old male with PMH as noted above presents with gradual worsening edema/anasarca over the last few months associated with shortness of breath.  He does not carry diagnosis of CHF, however he has not seen cardiology since the summer 2023 and has not had a recent echocardiogram.  Differential diagnosis includes, but is not limited to, CHF, aortic stenosis, edema due to other etiologies such as hepatic.  We will obtain chest x-ray, lab workup, give IV Lasix, and reassess.  Patient's presentation is most consistent with acute presentation with potential threat to life or bodily function.  The patient is on the cardiac monitor to evaluate for evidence of arrhythmia and/or significant heart rate changes.  ----------------------------------------- 10:33 AM on 01/14/2024 -----------------------------------------  Chest x-ray shows cardiomegaly and bilateral interstitial opacities consistent with edema.  BNP is also elevated consistent with CHF.  Troponin is mildly elevated.  The patient has had significant urine output.  The patient will need admission for further management.  I consulted Dr. Chipper Herb from the hospitalist service; based on our discussion he agrees to evaluate the patient for admission.  FINAL CLINICAL IMPRESSION(S) / ED DIAGNOSES   Final diagnoses:  Acute congestive heart failure, unspecified heart failure type (HCC)     Rx / DC Orders   ED Discharge Orders     None        Note:  This document was prepared using Dragon voice recognition software and may include unintentional dictation errors.    Dionne Bucy, MD 01/14/24 1034

## 2024-01-14 NOTE — Progress Notes (Signed)
   01/14/24 1045  Spiritual Encounters  Type of Visit Initial  Care provided to: Pt and family  Referral source Chaplain assessment  Reason for visit Routine spiritual support  OnCall Visit No  Spiritual Framework  Presenting Themes Meaning/purpose/sources of inspiration  Interventions  Spiritual Care Interventions Made Established relationship of care and support;Compassionate presence;Prayer;Encouragement  Intervention Outcomes  Outcomes Connection to spiritual care;Awareness around self/spiritual resourses;Reduced anxiety  Spiritual Care Plan  Spiritual Care Issues Still Outstanding No further spiritual care needs at this time (see row info)

## 2024-01-14 NOTE — ED Notes (Signed)
 Lt green, purple, and blue top sent to lab

## 2024-01-14 NOTE — Consult Note (Signed)
 [facility] CARDIOLOGY CONSULT NOTE       Patient ID: Mark Blevins MRN: [id number] DOB/AGE: 14-Jun-1941 83 y.o.  Admit date: 01/14/2024 Referring Physician Dr. Mikey College Primary Physician Corky Downs, MD  Primary Cardiologist Minda Ditto, [place] (last seen 2023) Reason for Consultation AoCHF  HPI: Mark Blevins is a 83 y.o. male  with a past medical history of moderate aortic valve stenosis, hypertension, persistent atrial fibrillation who presented to the ED on 01/14/2024 for shortness of breath. BNP elevated at 935. Cardiology was consulted for further evaluation.   Patient reports that since last fall he has been noticing worsening shortness of breath.  His symptoms of gotten so significant that he can no longer walk very far without becoming very dyspneic Mark having leg discomfort.  Given his symptoms have progressively worsened he decided to come to the ED for further evaluation.  Workup in the ED notable for Cr 1.57, K 3.0, hgb 10.2, WBC 3.4. BNP elevated at 935. Troponins trended 34 > 32. TSH WNL. EKG revealed atrial fibrillation with PVCs. CXR pulmonary vascular congestion. Started on IV lasix in the ED.   At the time of my evaluation this afternoon, he is resting comfortably in ED stretcher at a slight incline.  We discussed his symptoms in further detail.  He endorses now significant functional limitations Mark can hardly walk short distance without having to stop Mark rest.  He denies any associated chest pain or palpitation symptoms.  Has also noticed worsening swelling in his legs Mark abdominal distention.  When history of aortic stenosis was mentioned he states that he does remember being told that he had a valve problem that he would possibly need surgery for in the future.  Review of systems complete Mark found to be negative unless listed above    Past Medical History:  Diagnosis Date   Aortic valve stenosis    moderate (TTE 2022)   Atrial fibrillation (HCC)    BPH (benign  prostatic hyperplasia)    Hypertension    Poor circulation of extremity    left leg   Venous reflux    Wears dentures    Full upper Mark lower    Past Surgical History:  Procedure Laterality Date   APPENDECTOMY     BACK SURGERY     CATARACT EXTRACTION W/PHACO Left 02/05/2022   Procedure: CATARACT EXTRACTION PHACO Mark INTRAOCULAR LENS PLACEMENT (IOC) LEFT MALYUGIN 19.75 01:59.8;  Surgeon: Lockie Mola, MD;  Location: [facility];  Service: Ophthalmology;  Laterality: Left;   CATARACT EXTRACTION W/PHACO Right 02/19/2022   Procedure: CATARACT EXTRACTION PHACO Mark INTRAOCULAR LENS PLACEMENT (IOC) RIGHT MALYUGIN 1900 01:55.6;  Surgeon: Lockie Mola, MD;  Location: [facility];  Service: Ophthalmology;  Laterality: Right;   COLECTOMY N/A 04/27/2019   Procedure: COLECTOMY WITH COLOSTOMY;  Surgeon: Sung Amabile, DO;  Location: [facility];  Service: General;  Laterality: N/A;   COLONOSCOPY WITH PROPOFOL N/A 04/24/2019   Procedure: COLONOSCOPY WITH PROPOFOL;  Surgeon: Sung Amabile, DO;  Location: [facility];  Service: General;  Laterality: N/A;   LAPAROSCOPIC LYSIS OF ADHESIONS  05/06/2019   Procedure: LAPAROSCOPIC LYSIS OF ADHESIONS;  Surgeon: Sung Amabile, DO;  Location: [facility];  Service: General;;   LAPAROSCOPY N/A 05/06/2019   Procedure: LAPAROSCOPY DIAGNOSTIC;  Surgeon: Sung Amabile, DO;  Location: [facility];  Service: General;  Laterality: N/A;   REPLACEMENT TOTAL KNEE BILATERAL     XI ROBOTIC ASSISTED COLOSTOMY TAKEDOWN N/A 02/03/2020   Procedure: XI ROBOTIC ASSISTED  COLOSTOMY TAKEDOWN;  Surgeon: Sung Amabile, DO;  Location: [facility];  Service: General;  Laterality: N/A;    (Not in a hospital admission)  Social History   Socioeconomic History   Marital status: Married    Spouse name: Not on file   Number of children: Not on file   Years of education: Not on file   Highest education level: Not on file  Occupational History   Not on file  Tobacco Use    Smoking status: Former    Current packs/day: 0.00    Types: Cigarettes    Quit date: 1971    Years since quitting: 54.2   Smokeless tobacco: Current    Types: Chew   Tobacco comments:    quit smoking 1969 or 1970  Vaping Use   Vaping status: Never Used  Substance Mark Sexual Activity   Alcohol use: Yes    Alcohol/week: 24.0 standard drinks of alcohol    Types: 24 Cans of beer per week    Comment: 3-4 per day   Drug use: Never   Sexual activity: Yes  Other Topics Concern   Not on file  Social History Narrative   Not on file   Social Drivers of Health   Financial Resource Strain: Low Risk  (08/22/2021)   Overall Financial Resource Strain (CARDIA)    Difficulty of Paying Living Expenses: Not hard at all  Food Insecurity: No Food Insecurity (08/22/2021)   Hunger Vital Sign    Worried About Running Out of Food in the Last Year: Never true    Ran Out of Food in the Last Year: Never true  Transportation Needs: No Transportation Needs (08/22/2021)   PRAPARE - Administrator, Civil Service (Medical): No    Lack of Transportation (Non-Medical): No  Physical Activity: Insufficiently Active (08/22/2021)   Exercise Vital Sign    Days of Exercise per Week: 2 days    Minutes of Exercise per Session: 20 min  Stress: No Stress Concern Present (08/22/2021)   [employer] of Occupational Health - Occupational Stress Questionnaire    Feeling of Stress : Not at all  Social Connections: Socially Integrated (08/22/2021)   Social Connection Mark Isolation Panel [NHANES]    Frequency of Communication with Friends Mark Family: More than three times a week    Frequency of Social Gatherings with Friends Mark Family: More than three times a week    Attends Religious Services: More than 4 times per year    Active Member of [employer] or Organizations: Yes    Attends Banker Meetings: More than 4 times per year    Marital Status: Married  Catering manager Violence: Not At Risk  (08/22/2021)   Humiliation, Afraid, Rape, Mark Kick questionnaire    Fear of Current or Ex-Partner: No    Emotionally Abused: No    Physically Abused: No    Sexually Abused: No    History reviewed. No pertinent family history.   Vitals:   01/14/24 0930 01/14/24 1100 01/14/24 1130 01/14/24 1145  BP: 136/78 134/82 123/84   Pulse: 74 65 74 69  Resp: 15 16 20 16   Temp:   98 F (36.7 C)   TempSrc:      SpO2: 95% 95% 97% 97%  Weight:      Height:        PHYSICAL EXAM General: Chronically ill-appearing elderly male, well nourished, in no acute distress. HEENT: Normocephalic Mark atraumatic. Neck: No JVD.  Lungs: Normal respiratory effort  on room air.  Bibasilar crackles Heart: Irregularly irregular, controlled rate. Normal S1 Mark S2 without gallops or murmurs.  Abdomen: Non-distended appearing.  Msk: Normal strength Mark tone for age. Extremities: Warm Mark well perfused. No clubbing, cyanosis. 2+ pitting edema.  Neuro: Alert Mark oriented X 3. Psych: Answers questions appropriately.   Labs: Basic Metabolic Panel: Recent Labs    01/14/24 0710  NA 139  K 3.0*  CL 106  CO2 23  GLUCOSE 107*  BUN 19  CREATININE 1.57*  CALCIUM 8.2*   Liver Function Tests: Recent Labs    01/14/24 0710  AST 28  ALT 17  ALKPHOS 64  BILITOT 1.0  PROT 6.2*  ALBUMIN 3.2*   No results for input(s): "LIPASE", "AMYLASE" in the last 72 hours. CBC: Recent Labs    01/14/24 0710  WBC 3.4*  HGB 10.2*  HCT 31.1*  MCV 99.7  PLT 147*   Cardiac Enzymes: Recent Labs    01/14/24 0710 01/14/24 1010  TROPONINIHS 34* 32*   BNP: Recent Labs    01/14/24 0710  BNP 935.6*   D-Dimer: No results for input(s): "DDIMER" in the last 72 hours. Hemoglobin A1C: No results for input(s): "HGBA1C" in the last 72 hours. Fasting Lipid Panel: No results for input(s): "CHOL", "HDL", "LDLCALC", "TRIG", "CHOLHDL", "LDLDIRECT" in the last 72 hours. Thyroid Function Tests: No results for input(s): "TSH",  "T4TOTAL", "T3FREE", "THYROIDAB" in the last 72 hours.  Invalid input(s): "FREET3" Anemia Panel: No results for input(s): "VITAMINB12", "FOLATE", "FERRITIN", "TIBC", "IRON", "RETICCTPCT" in the last 72 hours.   Radiology: DG Chest 2 View Result Date: 01/14/2024 CLINICAL DATA:  Shortness of breath. EXAM: CHEST - 2 VIEW COMPARISON:  PA Lat chest 01/09/2020 FINDINGS: There is increased mild cardiomegaly. Interval new central vascular congestion, with mild basilar interstitial edema is noted Mark small pleural effusions are forming. No focal airspace disease is seen. The mediastinum is normally outlined. There is calcification of the transverse aorta. No new osseous findings. Multilevel thoracic spine bridging enthesopathy Mark slight dextroscoliosis are again shown. IMPRESSION: 1. Increased mild cardiomegaly with central vascular congestion, mild basilar interstitial edema Mark small pleural effusions. Findings are consistent with CHF. 2. Aortic atherosclerosis. Electronically Signed   By: Almira Bar M.D.   On: 01/14/2024 07:39   VAS [place] LOWER EXT VENOUS POST ABLATION Result Date: 12/18/2023  Lower Venous Reflux Study Patient Name:  Mark Blevins  Date of Exam:   12/17/2023 Medical Rec #: [id number]      Accession #:    [id number] Date of Birth: 1941-06-10      Patient Gender: M Patient Age:   51 years Exam Location:  [facility] Procedure:      VAS [place] LOWER EXTREMITY VENOUS POST ABLATION Referring Phys: Earl Lites SCHNIER --------------------------------------------------------------------------------  Indications: F/U Lt GSV Ablation Imaging.  Performing Technologist: Debbe Bales RVS  Examination Guidelines: A complete evaluation includes B-mode imaging, spectral Doppler, color Doppler, Mark power Doppler as needed of all accessible portions of each vessel. Bilateral testing is considered an integral part of a complete examination. Limited examinations for reoccurring indications may be performed as  noted. The reflux portion of the exam is performed with the patient in reverse Trendelenburg. Significant venous reflux is defined as >500 ms in the superficial venous system, Mark >1 second in the deep venous system.  +--------------+--------+------+----------+------------+-----------------------+ LEFT          Reflux  Reflux  Reflux  Diameter cmsComments  No       Yes     Time                                       +--------------+--------+------+----------+------------+-----------------------+ CFV           no                                                          +--------------+--------+------+----------+------------+-----------------------+ FV prox       no                                                          +--------------+--------+------+----------+------------+-----------------------+ FV mid        no                                                          +--------------+--------+------+----------+------------+-----------------------+ FV dist       no                                                          +--------------+--------+------+----------+------------+-----------------------+ Popliteal     no                                                          +--------------+--------+------+----------+------------+-----------------------+ GSV at SFJ    no                                                          +--------------+--------+------+----------+------------+-----------------------+ GSV prox thighno                                  prior                                                                     ablation/stripping      +--------------+--------+------+----------+------------+-----------------------+ GSV mid thigh no  prior                                                                     ablation/stripping       +--------------+--------+------+----------+------------+-----------------------+ GSV dist thighno                                  prior                                                                     ablation/stripping      +--------------+--------+------+----------+------------+-----------------------+ GSV at knee   no                                  prior                                                                     ablation/stripping      +--------------+--------+------+----------+------------+-----------------------+ GSV prox calf no                                                          +--------------+--------+------+----------+------------+-----------------------+   Summary: Left: - Flow seen in the Left GSV post Ablation in the Left Thigh Mark Knee area but no Reflux.  *See table(s) above for measurements Mark observations. Electronically signed by Levora Dredge MD on 12/18/2023 at 7:25:50 AM.    Final     ECHO ordered  TELEMETRY reviewed by me 01/14/2024: Not on telemetry  EKG reviewed by me: atrial fibrillation PVCs rate 77 bpm  Data reviewed by me 01/14/2024: last 24h vitals tele labs imaging I/O ED provider note, admission H&P  Principal Problem:   CHF (congestive heart failure) (HCC) Active Problems:   Aortic stenosis   Acute on chronic diastolic CHF (congestive heart failure) (HCC)    ASSESSMENT Mark PLAN:  [facility] [place] is a 83 y.o. male  with a past medical history of moderate aortic valve stenosis, hypertension, persistent atrial fibrillation who presented to the ED on 01/14/2024 for shortness of breath. BNP elevated at 935. Cardiology was consulted for further evaluation.   # Acute on chronic HFpEF # Moderate aortic stenosis # Chronic atrial fibrillation Patient with hx of HFpEF Mark moderate AS on last echo 05/2021. Now presenting with SOB, anasarca. BNP elevated at 935. Given dose of IV lasix in the ED with 4.9 L of UOP.  -Echo  ordered to evaluate for progression of AS as well as LV function.  -Continue  IV lasix 40 mg twice daily. -Continue carvedilol 3.125 mg twice daily for rate control Mark eliquis 5 mg twice daily for stroke risk reduction.  -Minimally elevated Mark flat troponin most consistent with demand/supply mismatch Mark not ACS     This patient's plan of care was discussed Mark created with Dr. Juliann Pares Mark he is in agreement.  Signed: Gale Journey, PA-C  01/14/2024, 12:45 PM [facility]

## 2024-01-14 NOTE — ED Triage Notes (Addendum)
 Pt via POV from home. Pt c/o bilateral lower and upper extremity swelling for the past month and states SOB for the past year. Reports bilateral leg pain when he walks. States that he is supposed to take fluid pills but he hasn't taken them in a week and reports that he felt like it made it worse. States he does take Eliquis at home. Pt is A&OX4, tachypneic on arrival.

## 2024-01-14 NOTE — Progress Notes (Signed)
 Heart Failure Navigator Progress Note  Assessed for Heart & Vascular TOC clinic readiness.  Patient does not meet criteria due to current [facility] patient.   Navigator will sign off at this time.  Roxy Horseman, RN, BSN [facility] Heart Failure Navigator Secure Chat Only

## 2024-01-14 NOTE — Progress Notes (Signed)
*  PRELIMINARY RESULTS* Echocardiogram 2D Echocardiogram has been performed.  Cristela Blue 01/14/2024, 2:50 PM

## 2024-01-14 NOTE — H&P (Addendum)
 History and Physical    Mark Blevins UEA:[id number] DOB: 1941/01/10 DOA: 01/14/2024  PCP: Mark Downs, MD (Confirm with patient/family/NH records and if not entered, this has to be entered at [facility] point of entry) Patient coming from: Home  I have personally briefly reviewed patient's old medical records in [facility]  Chief Complaint: SOB, leg and belly swelling  HPI: Mark Blevins is a 83 y.o. male with medical history significant of PAF on Eliquis, HTN, chronic HFpEF, moderate aortic stenosis, CKD stage II presented with worsening of leg swelling and exertional dyspnea.  Patient started noticed increasing leg swelling and exertional dyspnea for at least 6 months gradually getting worse.  Denied any chest pains.  At baseline he has poor ambulation function, uses roller walker.  Since new year, he has noticed that he could barely afford walking more than 30 feet inside his house without starting to develop shortness of breath.  Denied any chest pain no cough no syncope.  Recently he noticed significant worsening of swelling, and abdominal girth.  Patient tried to use as needed Lasix however achieved little effect, with minimally increased urine output.  ED Course: Afebrile, nontachycardic blood pressure SBP 120-130, O2 saturation 100% on room air.  Chest x-ray appeared to have mild pulmonary congestion.  Blood work showed potassium 3.0, glucose 107, BUN 19, creatinine 1.5 compared to baseline 1.2-1.3.  Patient was given IV Lasix 80 mg x 1 in the ED.  Review of Systems: As per HPI otherwise 14 point review of systems negative.    Past Medical History:  Diagnosis Date   Aortic valve stenosis    moderate (TTE 2022)   Atrial fibrillation (HCC)    BPH (benign prostatic hyperplasia)    Hypertension    Poor circulation of extremity    left leg   Venous reflux    Wears dentures    Full upper and lower    Past Surgical History:  Procedure Laterality Date   APPENDECTOMY     BACK  SURGERY     CATARACT EXTRACTION W/PHACO Left 02/05/2022   Procedure: CATARACT EXTRACTION PHACO AND INTRAOCULAR LENS PLACEMENT (IOC) LEFT MALYUGIN 19.75 01:59.8;  Surgeon: Lockie Mola, MD;  Location: [facility];  Service: Ophthalmology;  Laterality: Left;   CATARACT EXTRACTION W/PHACO Right 02/19/2022   Procedure: CATARACT EXTRACTION PHACO AND INTRAOCULAR LENS PLACEMENT (IOC) RIGHT MALYUGIN 1900 01:55.6;  Surgeon: Lockie Mola, MD;  Location: [facility];  Service: Ophthalmology;  Laterality: Right;   COLECTOMY N/A 04/27/2019   Procedure: COLECTOMY WITH COLOSTOMY;  Surgeon: Sung Amabile, DO;  Location: [facility] ORS;  Service: General;  Laterality: N/A;   COLONOSCOPY WITH PROPOFOL N/A 04/24/2019   Procedure: COLONOSCOPY WITH PROPOFOL;  Surgeon: Sung Amabile, DO;  Location: [facility]COPY;  Service: General;  Laterality: N/A;   LAPAROSCOPIC LYSIS OF ADHESIONS  05/06/2019   Procedure: LAPAROSCOPIC LYSIS OF ADHESIONS;  Surgeon: Sung Amabile, DO;  Location: [facility] ORS;  Service: General;;   LAPAROSCOPY N/A 05/06/2019   Procedure: LAPAROSCOPY DIAGNOSTIC;  Surgeon: Sung Amabile, DO;  Location: [facility];  Service: General;  Laterality: N/A;   REPLACEMENT TOTAL KNEE BILATERAL     XI ROBOTIC ASSISTED COLOSTOMY TAKEDOWN N/A 02/03/2020   Procedure: XI ROBOTIC ASSISTED COLOSTOMY TAKEDOWN;  Surgeon: Sung Amabile, DO;  Location: [facility] ORS;  Service: General;  Laterality: N/A;     reports that he quit smoking about 54 years ago. His smoking use included cigarettes. His smokeless tobacco use includes chew. He reports current alcohol  use of about 24.0 standard drinks of alcohol per week. He reports that he does not use drugs.  Allergies  Allergen Reactions   Codeine Nausea And Vomiting   Morphine And Codeine Nausea And Vomiting    History reviewed. No pertinent family history.   Prior to Admission medications   Medication Sig Start Date End Date Taking? Authorizing Provider   ALPRAZolam Prudy Feeler) 0.5 MG tablet Take 0.5 mg by mouth once as needed for anxiety (for procedures).   Yes [provider]  amLODipine (NORVASC) 5 MG tablet TAKE 1 TABLET(5 MG) BY MOUTH DAILY 04/22/22  Yes Masoud, Renda Rolls, MD  apixaban (ELIQUIS) 5 MG TABS tablet Take 1 tablet (5 mg total) by mouth 2 (two) times daily. 04/14/22  Yes Masoud, Renda Rolls, MD  cloNIDine (CATAPRES) 0.1 MG tablet Take 1 tablet (0.1 mg total) by mouth daily. 07/10/22  Yes Masoud, Renda Rolls, MD  furosemide (LASIX) 20 MG tablet Take 20 mg by mouth daily. 10/30/23  Yes [provider]  labetalol (NORMODYNE) 200 MG tablet Take 1 tablet (200 mg total) by mouth daily. 07/10/22  Yes Masoud, Renda Rolls, MD  Multiple Vitamin (MULTIVITAMIN WITH MINERALS) TABS tablet Take 1 tablet by mouth daily.   Yes [provider]  potassium chloride (KLOR-CON) 10 MEQ tablet Take 10 mEq by mouth daily. 10/30/23  Yes [provider]  cephALEXin (KEFLEX) 500 MG capsule Take 500 mg by mouth 2 (two) times daily. Patient not taking: Reported on 12/24/2023 04/27/23   [provider]  silver sulfADIAZINE (SILVADENE) 1 % cream Apply 1 Application topically daily. Patient not taking: Reported on 01/07/2024 09/17/23   [provider]  sulfamethoxazole-trimethoprim (BACTRIM DS) 800-160 MG tablet Take 1 tablet by mouth 2 (two) times daily. Patient not taking: Reported on 12/24/2023 06/29/23   Mark Spinner, NP    Physical Exam: Vitals:   01/14/24 0930 01/14/24 1100 01/14/24 1130 01/14/24 1145  BP: 136/78 134/82 123/84   Pulse: 74 65 74 69  Resp: 15 16 20 16   Temp:   98 F (36.7 C)   TempSrc:      SpO2: 95% 95% 97% 97%  Weight:      Height:        Constitutional: NAD, calm, comfortable Vitals:   01/14/24 0930 01/14/24 1100 01/14/24 1130 01/14/24 1145  BP: 136/78 134/82 123/84   Pulse: 74 65 74 69  Resp: 15 16 20 16   Temp:   98 F (36.7 C)   TempSrc:      SpO2: 95% 95% 97% 97%  Weight:      Height:        Eyes: PERRL, lids and conjunctivae normal ENMT: Mucous membranes are moist. Posterior pharynx clear of any exudate or lesions.Normal dentition.  Neck: normal, supple, no masses, no thyromegaly Respiratory: clear to auscultation bilaterally, no wheezing, no crackles. Normal respiratory effort. No accessory muscle use.  Cardiovascular: Regular rate and rhythm, systolic murmur carb-based, diminished S2.  Anasarca to bilateral lower chest, positive ascites. 2+ pedal pulses. No carotid bruits.  Abdomen: no tenderness, no masses palpated. No hepatosplenomegaly. Bowel sounds positive.  Musculoskeletal: no clubbing / cyanosis. No joint deformity upper and lower extremities. Good ROM, no contractures. Normal muscle tone.  Skin: no rashes, lesions, ulcers. No induration Neurologic: CN 2-12 grossly intact. Sensation intact, DTR normal. Strength 5/5 in all 4.  Psychiatric: Normal judgment and insight. Alert and oriented x 3. Normal mood.    Labs on Admission: I have personally reviewed following labs and imaging  studies  CBC: Recent Labs  Lab 01/14/24 0710  WBC 3.4*  HGB 10.2*  HCT 31.1*  MCV 99.7  PLT 147*   Basic Metabolic Panel: Recent Labs  Lab 01/14/24 0710  NA 139  K 3.0*  CL 106  CO2 23  GLUCOSE 107*  BUN 19  CREATININE 1.57*  CALCIUM 8.2*   GFR: Estimated Creatinine Clearance: 46.3 mL/min (A) (by C-G formula based on SCr of 1.57 mg/dL (H)). Liver Function Tests: Recent Labs  Lab 01/14/24 0710  AST 28  ALT 17  ALKPHOS 64  BILITOT 1.0  PROT 6.2*  ALBUMIN 3.2*   No results for input(s): "LIPASE", "AMYLASE" in the last 168 hours. No results for input(s): "AMMONIA" in the last 168 hours. Coagulation Profile: No results for input(s): "INR", "PROTIME" in the last 168 hours. Cardiac Enzymes: No results for input(s): "CKTOTAL", "CKMB", "CKMBINDEX", "TROPONINI" in the last 168 hours. BNP (last 3 results) No results for input(s): "PROBNP" in the last 8760  hours. HbA1C: No results for input(s): "HGBA1C" in the last 72 hours. CBG: No results for input(s): "GLUCAP" in the last 168 hours. Lipid Profile: No results for input(s): "CHOL", "HDL", "LDLCALC", "TRIG", "CHOLHDL", "LDLDIRECT" in the last 72 hours. Thyroid Function Tests: No results for input(s): "TSH", "T4TOTAL", "FREET4", "T3FREE", "THYROIDAB" in the last 72 hours. Anemia Panel: No results for input(s): "VITAMINB12", "FOLATE", "FERRITIN", "TIBC", "IRON", "RETICCTPCT" in the last 72 hours. Urine analysis:    Component Value Date/Time   COLORURINE YELLOW (A) 04/25/2019 1049   APPEARANCEUR HAZY (A) 04/25/2019 1049   LABSPEC 1.014 04/25/2019 1049   PHURINE 6.0 04/25/2019 1049   GLUCOSEU NEGATIVE 04/25/2019 1049   HGBUR SMALL (A) 04/25/2019 1049   BILIRUBINUR NEGATIVE 04/25/2019 1049   KETONESUR 20 (A) 04/25/2019 1049   PROTEINUR NEGATIVE 04/25/2019 1049   NITRITE POSITIVE (A) 04/25/2019 1049   LEUKOCYTESUR LARGE (A) 04/25/2019 1049    Radiological Exams on Admission: DG Chest 2 View Result Date: 01/14/2024 CLINICAL DATA:  Shortness of breath. EXAM: CHEST - 2 VIEW COMPARISON:  PA Lat chest 01/09/2020 FINDINGS: There is increased mild cardiomegaly. Interval new central vascular congestion, with mild basilar interstitial edema is noted and small pleural effusions are forming. No focal airspace disease is seen. The mediastinum is normally outlined. There is calcification of the transverse aorta. No new osseous findings. Multilevel thoracic spine bridging enthesopathy and slight dextroscoliosis are again shown. IMPRESSION: 1. Increased mild cardiomegaly with central vascular congestion, mild basilar interstitial edema and small pleural effusions. Findings are consistent with CHF. 2. Aortic atherosclerosis. Electronically Signed   By: Almira Bar M.D.   On: 01/14/2024 07:39    EKG: Independently reviewed.  Sinus with frequent PVCs, no acute ST changes.  Assessment/Plan Principal  Problem:   CHF (congestive heart failure) (HCC) Active Problems:   Aortic stenosis   Acute on chronic diastolic CHF (congestive heart failure) (HCC)  (please populate well all problems here in Problem List. (For example, if patient is on BP meds at home and you resume or decide to hold them, it is a problem that needs to be her. Same for CAD, COPD, HLD and so on)  Acute on chronic HFpEF decompensation Acute on chronic moderate-now suspect severe AS decompensation Anasarca -Discussed with on-call general cardiology, who will see the patient -Echo  -Continue IV diuresis -Check ascites ultrasound for possible paracentesis to relieve symptoms  Hypokalemia -IV and p.o. replacement  Acute urinary retention History of BPH -Foley was placed in the ED.  So  far drained about 2.5 L urine after IV Lasix -Continue I and O's -May need to consider restart BPH medications  HTN -Discontinue clonidine and amlodipine given worsening of CHF -Change labetalol to Coreg and can be titrated up according to response  CKD stage II -Slight increase of creatinine level, and significant fluid overload, suspect cardiorenal syndrome -Diuresis as above, and daily renal function  PAF -Sinus rhythm -Change labetalol to Coreg -Continue Eliquis   DVT prophylaxis: Eliquis Code Status: Full code Family Communication: Wife at bedside Disposition Plan: Patient is sick with significant fluid overload from CHF, with baseline CKD stage II, requiring IV diuresis with daily kidney function monitoring, expect more than 2 midnight hospital stay Consults called: [facility] cardiology Admission status: Telemetry admission   Emeline General MD [facility] Pager [phone]  01/14/2024, 1:01 PM

## 2024-01-15 ENCOUNTER — Inpatient Hospital Stay

## 2024-01-15 DIAGNOSIS — I10 Essential (primary) hypertension: Secondary | ICD-10-CM

## 2024-01-15 DIAGNOSIS — N400 Enlarged prostate without lower urinary tract symptoms: Secondary | ICD-10-CM

## 2024-01-15 DIAGNOSIS — I5043 Acute on chronic combined systolic (congestive) and diastolic (congestive) heart failure: Secondary | ICD-10-CM

## 2024-01-15 DIAGNOSIS — N401 Enlarged prostate with lower urinary tract symptoms: Secondary | ICD-10-CM

## 2024-01-15 DIAGNOSIS — E876 Hypokalemia: Secondary | ICD-10-CM | POA: Diagnosis not present

## 2024-01-15 DIAGNOSIS — R338 Other retention of urine: Secondary | ICD-10-CM | POA: Diagnosis not present

## 2024-01-15 DIAGNOSIS — I350 Nonrheumatic aortic (valve) stenosis: Secondary | ICD-10-CM

## 2024-01-15 DIAGNOSIS — I480 Paroxysmal atrial fibrillation: Secondary | ICD-10-CM

## 2024-01-15 DIAGNOSIS — N182 Chronic kidney disease, stage 2 (mild): Secondary | ICD-10-CM | POA: Diagnosis present

## 2024-01-15 LAB — BASIC METABOLIC PANEL
Anion gap: 14 (ref 5–15)
BUN: 16 mg/dL (ref 8–23)
CO2: 26 mmol/L (ref 22–32)
Calcium: 8.1 mg/dL — ABNORMAL LOW (ref 8.9–10.3)
Chloride: 102 mmol/L (ref 98–111)
Creatinine, Ser: 1.46 mg/dL — ABNORMAL HIGH (ref 0.61–1.24)
GFR, Estimated: 47 mL/min — ABNORMAL LOW (ref >60)
Glucose, Bld: 88 mg/dL (ref 70–99)
Potassium: 2.7 mmol/L — CL (ref 3.5–5.1)
Sodium: 142 mmol/L (ref 135–145)

## 2024-01-15 LAB — MAGNESIUM: Magnesium: 1.8 mg/dL (ref 1.7–2.4)

## 2024-01-15 MED ORDER — POTASSIUM CHLORIDE CRYS ER 20 MEQ PO TBCR
40.0000 meq | EXTENDED_RELEASE_TABLET | Freq: Once | ORAL | Status: AC
  Administered 2024-01-15: 40 meq via ORAL
  Filled 2024-01-15: qty 2

## 2024-01-15 MED ORDER — CLONIDINE HCL 0.1 MG PO TABS: 0.1000 mg | ORAL_TABLET | Freq: Every day | ORAL | Status: DC

## 2024-01-15 MED ORDER — TAMSULOSIN HCL 0.4 MG PO CAPS
0.4000 mg | ORAL_CAPSULE | Freq: Every day | ORAL | Status: DC
  Administered 2024-01-15 – 2024-01-22 (×8): 0.4 mg via ORAL
  Filled 2024-01-15 (×9): qty 1

## 2024-01-15 MED ORDER — POTASSIUM CHLORIDE 10 MEQ/100ML IV SOLN
10.0000 meq | INTRAVENOUS | Status: AC
  Administered 2024-01-15 (×4): 10 meq via INTRAVENOUS
  Filled 2024-01-15 (×3): qty 100

## 2024-01-15 MED ORDER — CHLORHEXIDINE GLUCONATE CLOTH 2 % EX PADS
6.0000 | MEDICATED_PAD | Freq: Every day | CUTANEOUS | Status: DC
  Administered 2024-01-15 – 2024-01-22 (×8): 6 via TOPICAL

## 2024-01-15 MED ORDER — POTASSIUM CHLORIDE 10 MEQ/100ML IV SOLN
INTRAVENOUS | Status: AC
  Filled 2024-01-15: qty 100

## 2024-01-15 MED ORDER — MAGNESIUM SULFATE 2 GM/50ML IV SOLN
2.0000 g | Freq: Once | INTRAVENOUS | Status: AC
  Administered 2024-01-15: 2 g via INTRAVENOUS
  Filled 2024-01-15: qty 50

## 2024-01-15 NOTE — ED Notes (Signed)
 Pt eyes closed, respirations even nonlabored.

## 2024-01-15 NOTE — Hospital Course (Addendum)
 Taken from H&P.  Mark Blevins is a 83 y.o. male with medical history significant of PAF on Eliquis, HTN, chronic HFpEF, moderate aortic stenosis, CKD stage II presented with worsening of leg swelling and exertional dyspnea, over the.  Of last 57-month, gradually worsening.  Recently noticed significantly worsening of swelling and abdominal girth.  On presentation hemodynamically stable, on room air.  Chest x-ray with mild pulmonary congestion.  Labs with potassium of 3, BUN 19, creatinine 1.5 with baseline of 1.2-1.3.  BUN 935, troponin 34>>32  Patient was admitted for IV diuresis.  Echocardiogram ordered.  Cardiology was consulted.  3/7: Vital stable, ultrasound abdomen with minimal ascites, mild right pleural effusion and soft tissue edema.  Labs with potassium of 2.7, magnesium of 1.8, TSH normal.  Creatinine with some improvement to 1.46.  Echocardiogram with EF of 45 to 50%, regional wall motion abnormalities and grade 1 diastolic dysfunction.  Calcified aortic valve with severe aortic stenosis.   3/8: Hemodynamically stable, good UOP but weight recorded is slight increase than before which does not make sense.  Significant hypokalemia and mild hypomagnesemia which are being repleted.  Per cardiology he might be a poor candidate for TAVR or SAVR but they will evaluate as outpatient.  Continue IV diuresis and replating electrolytes.  3/9: Hemodynamically stable, continuing diuresis with slowly improving creatinine.  Mild hypokalemia, hypophosphatemia and hypomagnesemia which is being repleted.  3/10: Remained hemodynamically stable, good urinary output with net negative of more than 20 L.  Slight increase in creatinine so IV Lasix is being held, we can start him on p.o. torsemide 40 mg from tomorrow. PT is recommending SNF-now medically stable  3/11: Remained stable, improving creatinine after stopping IV diuresis.  Starting on p.o. torsemide 40 mg daily.  Awaiting SNF placement  3/12: UOP of  more than 4 L recorded with torsemide 40 mg daily, slight increase in creatinine and decrease in sodium, decreasing torsemide to 20 mg daily.  Awaiting SNF placement.  3/13: Hemodynamically stable, slight worsening of renal function but patient did receive 40 mg of torsemide before decreasing to 20 yesterday. Had bed offer at [facility] authorization.

## 2024-01-15 NOTE — Assessment & Plan Note (Signed)
 Severe aortic stenosis per echocardiogram done yesterday. Likely contributing to his symptoms of dyspnea. -Cardiology is on board

## 2024-01-15 NOTE — Progress Notes (Signed)
 Patient ID: Mark Blevins, male   DOB: 1941/03/08, 83 y.o.   MRN: [id number] [facility]    SUBJECTIVE: Patient still complains of dyspnea shortness of breath lower extremity edema denies any chest pain still has generalized weakness and fatigue.   Vitals:   01/15/24 0712 01/15/24 0805 01/15/24 0830 01/15/24 0930  BP: (!) 141/83 139/84 120/75 122/85  Pulse: 79 75 88 80  Resp: 18  16 14   Temp: 98.8 F (37.1 C)     TempSrc: Oral     SpO2: 100%  100% 100%  Weight:      Height:         Intake/Output Summary (Last 24 hours) at 01/15/2024 0950 Last data filed at 01/15/2024 0865 Gross per 24 hour  Intake 46.96 ml  Output 78469 ml  Net -10753.04 ml      PHYSICAL EXAM  General: Well developed, well nourished, in no acute distress HEENT:  Normocephalic and atramatic Neck:  No JVD.  Lungs: Clear bilaterally to auscultation and percussion. Heart: HRRR . Normal S1 and S2 without gallops or 3/6 sem murmurs.  Abdomen: Bowel sounds are positive, abdomen soft and non-tender  Msk:  Back normal, normal gait. Normal strength and tone for age. Extremities: No clubbing, cyanosis or 4+edema.   Neuro: Alert and oriented X 3. Psych:  Good affect, responds appropriately   LABS: Basic Metabolic Panel: Recent Labs    01/14/24 0710 01/15/24 0440  NA 139 142  K 3.0* 2.7*  CL 106 102  CO2 23 26  GLUCOSE 107* 88  BUN 19 16  CREATININE 1.57* 1.46*  CALCIUM 8.2* 8.1*  MG 2.0 1.8  PHOS 3.3  --    Liver Function Tests: Recent Labs    01/14/24 0710  AST 28  ALT 17  ALKPHOS 64  BILITOT 1.0  PROT 6.2*  ALBUMIN 3.2*   No results for input(s): "LIPASE", "AMYLASE" in the last 72 hours. CBC: Recent Labs    01/14/24 0710  WBC 3.4*  HGB 10.2*  HCT 31.1*  MCV 99.7  PLT 147*   Cardiac Enzymes: No results for input(s): "CKTOTAL", "CKMB", "CKMBINDEX", "TROPONINI" in the last 72 hours. BNP: Invalid input(s): "POCBNP" D-Dimer: No results for input(s): "DDIMER" in the last 72  hours. Hemoglobin A1C: No results for input(s): "HGBA1C" in the last 72 hours. Fasting Lipid Panel: No results for input(s): "CHOL", "HDL", "LDLCALC", "TRIG", "CHOLHDL", "LDLDIRECT" in the last 72 hours. Thyroid Function Tests: Recent Labs    01/14/24 1010  TSH 2.858   Anemia Panel: No results for input(s): "VITAMINB12", "FOLATE", "FERRITIN", "TIBC", "IRON", "RETICCTPCT" in the last 72 hours.  DG Chest 1 View Result Date: 01/15/2024 CLINICAL DATA:  83 year old male with history of congestive heart failure. EXAM: CHEST  1 VIEW COMPARISON:  Chest x-ray 01/14/2024. FINDINGS: Lung volumes are low. No confluent consolidative airspace disease. No pleural effusions. No pneumothorax. No evidence of pulmonary edema. Heart size is mildly enlarged. The patient is rotated to the right on today's exam, resulting in distortion of the mediastinal contours and reduced diagnostic sensitivity and specificity for mediastinal pathology. IMPRESSION: 1. Low lung volumes without radiographic evidence of acute cardiopulmonary disease. 2. Mild cardiomegaly. Electronically Signed   By: Trudie Reed M.D.   On: 01/15/2024 07:22   ECHOCARDIOGRAM COMPLETE Result Date: 01/14/2024    ECHOCARDIOGRAM REPORT   Patient Name:   Mark Blevins Date of Exam: 01/14/2024 Medical Rec #:  [id number]     Height:  72.0 in Accession #:    [id number]    Weight:       250.0 lb Date of Birth:  May 21, 1941     BSA:          2.343 m Patient Age:    82 years      BP:           146/69 mmHg Patient Gender: M             HR:           68 bpm. Exam Location:  [facility] Procedure: 2D Echo, Cardiac Doppler and Color Doppler (Both Spectral and Color            Flow Doppler were utilized during procedure). Indications:     CHF-acute diastolic I50.31  History:         Patient has no prior history of Echocardiogram examinations.                  Arrythmias:Atrial Fibrillation; Risk Factors:Hypertension.  Sonographer:     Cristela Blue Referring Phys:  [id number]  CARALYN HUDSON Diagnosing Phys: Alwyn Pea MD IMPRESSIONS  1. Left ventricular ejection fraction, by estimation, is 45 to 50%. The left ventricle has normal function. The left ventricle demonstrates regional wall motion abnormalities (see scoring diagram/findings for description). There is moderate concentric left ventricular hypertrophy. Left ventricular diastolic parameters are consistent with Grade I diastolic dysfunction (impaired relaxation).  2. Right ventricular systolic function is normal. The right ventricular size is normal.  3. The mitral valve is normal in structure. No evidence of mitral valve regurgitation.  4. The aortic valve is calcified. Aortic valve regurgitation is mild. Severe aortic valve stenosis. FINDINGS  Left Ventricle: Ant/apical/setal hypo. Left ventricular ejection fraction, by estimation, is 45 to 50%. The left ventricle has normal function. The left ventricle demonstrates regional wall motion abnormalities. Strain was performed and the global longitudinal strain is indeterminate. The left ventricular internal cavity size was normal in size. There is moderate concentric left ventricular hypertrophy. Left ventricular diastolic parameters are consistent with Grade I diastolic dysfunction (impaired relaxation). Right Ventricle: The right ventricular size is normal. No increase in right ventricular wall thickness. Right ventricular systolic function is normal. Left Atrium: Left atrial size was normal in size. Right Atrium: Right atrial size was normal in size. Pericardium: There is no evidence of pericardial effusion. Mitral Valve: The mitral valve is normal in structure. No evidence of mitral valve regurgitation. MV peak gradient, 10.4 mmHg. The mean mitral valve gradient is 5.0 mmHg. Tricuspid Valve: The tricuspid valve is normal in structure. Tricuspid valve regurgitation is mild. Aortic Valve: The aortic valve is calcified. Aortic valve regurgitation is mild. Severe aortic  stenosis is present. Aortic valve mean gradient measures 29.8 mmHg. Aortic valve peak gradient measures 53.7 mmHg. Aortic valve area, by VTI measures 0.58 cm. Pulmonic Valve: The pulmonic valve was normal in structure. Pulmonic valve regurgitation is not visualized. Aorta: The ascending aorta was not well visualized. IAS/Shunts: No atrial level shunt detected by color flow Doppler. Additional Comments: 3D was performed not requiring image post processing on an independent workstation and was indeterminate.  LEFT VENTRICLE PLAX 2D LVIDd:         4.70 cm LVIDs:         3.30 cm LV PW:         1.70 cm LV IVS:        1.50 cm LVOT diam:     2.00 cm LV  SV:         48 LV SV Index:   21 LVOT Area:     3.14 cm  RIGHT VENTRICLE RV Basal diam:  4.50 cm RV Mid diam:    3.50 cm RV S prime:     9.03 cm/s TAPSE (M-mode): 2.2 cm LEFT ATRIUM              Index        RIGHT ATRIUM           Index LA diam:        3.40 cm  1.45 cm/m   RA Area:     23.60 cm LA Vol (A2C):   104.0 ml 44.40 ml/m  RA Volume:   72.00 ml  30.74 ml/m LA Vol (A4C):   134.0 ml 57.20 ml/m LA Biplane Vol: 121.0 ml 51.65 ml/m  AORTIC VALVE AV Area (Vmax):    0.51 cm AV Area (Vmean):   0.52 cm AV Area (VTI):     0.58 cm AV Vmax:           366.25 cm/s AV Vmean:          252.000 cm/s AV VTI:            0.829 m AV Peak Grad:      53.7 mmHg AV Mean Grad:      29.8 mmHg LVOT Vmax:         59.10 cm/s LVOT Vmean:        41.800 cm/s LVOT VTI:          0.153 m LVOT/AV VTI ratio: 0.18  AORTA Ao Root diam: 3.30 cm MITRAL VALVE                TRICUSPID VALVE MV Area (PHT): 5.58 cm     TR Peak grad:   44.1 mmHg MV Area VTI:   1.55 cm     TR Vmax:        332.00 cm/s MV Peak grad:  10.4 mmHg MV Mean grad:  5.0 mmHg     SHUNTS MV Vmax:       1.61 m/s     Systemic VTI:  0.15 m MV Vmean:      99.2 cm/s    Systemic Diam: 2.00 cm MV Decel Time: 136 msec MV E velocity: 156.00 cm/s Ashawna Hanback Salome Arnt MD Electronically signed by Alwyn Pea MD Signature Date/Time:  01/14/2024/4:35:16 PM    Final    [place] ASCITES (ABDOMEN LIMITED) Result Date: 01/14/2024 CLINICAL DATA:  Ascites EXAM: LIMITED ABDOMEN ULTRASOUND FOR ASCITES TECHNIQUE: Limited ultrasound survey for ascites was performed in all four abdominal quadrants. COMPARISON:  Abdomen pelvis CT 09/05/2021 FINDINGS: Minimal ascites identified in the 4 quadrants. Fatty liver infiltration. Note is made of a right pleural effusion. Edematous soft tissues. IMPRESSION: Minimal ascites suggested. Soft tissue edema. Right pleural effusion Electronically Signed   By: Karen Kays M.D.   On: 01/14/2024 14:09   DG Chest 2 View Result Date: 01/14/2024 CLINICAL DATA:  Shortness of breath. EXAM: CHEST - 2 VIEW COMPARISON:  PA Lat chest 01/09/2020 FINDINGS: There is increased mild cardiomegaly. Interval new central vascular congestion, with mild basilar interstitial edema is noted and small pleural effusions are forming. No focal airspace disease is seen. The mediastinum is normally outlined. There is calcification of the transverse aorta. No new osseous findings. Multilevel thoracic spine bridging enthesopathy and slight dextroscoliosis are again shown. IMPRESSION: 1. Increased mild cardiomegaly with central vascular  congestion, mild basilar interstitial edema and small pleural effusions. Findings are consistent with CHF. 2. Aortic atherosclerosis. Electronically Signed   By: Almira Bar M.D.   On: 01/14/2024 07:39     Echo severe aortic stenosis borderline left ventricular function 45 to 50%  TELEMETRY: [place] rhythm around 80:  ASSESSMENT AND PLAN:  Principal Problem:   CHF (congestive heart failure) (HCC) Active Problems:   Aortic stenosis   Acute on chronic diastolic CHF (congestive heart failure) (HCC)    Plan Acute decompensated congestive heart failure preserved left ventricular function recommend aggressive IV diuresis for anasarca Severe aortic stenosis valve area less than 0.6 heavy calcification poor TAVR or  SAVR candidate at this point, and conservative therapy Chronic atrial fibrillation rate controlled with carvedilol on Eliquis twice a day 5 mg Evaded troponin consistent with demand ischemia Recommend heart failure management and care Agree with IV Lasix twice daily to help with diuresis Supplemental oxygen and inhalers as necessary for shortness of breath   Alwyn Pea, MD 01/15/2024 9:50 AM

## 2024-01-15 NOTE — Assessment & Plan Note (Signed)
Starting on Flomax.

## 2024-01-15 NOTE — Plan of Care (Signed)

## 2024-01-15 NOTE — Consult Note (Signed)
 WOC Nurse Consult Note:  patient has a longstanding history of venous stasis ulcer L leg; sees vascular surgeon weekly for unna boot to R leg and Xeroform to left leg ulcer; last seen 01/07/2024  Reason for Consult: left leg ulcer  Wound type: full thickness r/t venous insufficiency  Pressure Injury POA: NA  Measurement: see nursing flowsheet  Wound bed:50% pink moist 50% yellow  Drainage (amount, consistency, odor) see nursing flowsheet  Periwound: edema, dry peeling skin  Dressing procedure/placement/frequency: Cleanse left lower leg with NS, apply Xeroform gauze to wound bed daily, cover with ABD pad and wrap leg with Kerlix roll gauze beginning right above toes and ending right below knee.  Secure entire dressing with Ace bandage wrapped in same fashion as Kerlix.    POC discussed with bedside nurse. WOC team will not follow. Re-consult if further needs arise.   Thank you,    Priscella Mann MSN, RN-BC, [employer] [phone]

## 2024-01-15 NOTE — Assessment & Plan Note (Signed)
 Patient has an history of BPH and was not taking any medication currently. Foley catheter was placed in ED.

## 2024-01-15 NOTE — Plan of Care (Signed)

## 2024-01-15 NOTE — Progress Notes (Addendum)
 Progress Note   Patient: Mark Blevins UJW:[id number] DOB: September 06, 1941 DOA: 01/14/2024     1 DOS: the patient was seen and examined on 01/15/2024   Brief hospital course: Taken from H&P.  Mark Blevins is a 83 y.o. male with medical history significant of PAF on Eliquis, HTN, chronic HFpEF, moderate aortic stenosis, CKD stage II presented with worsening of leg swelling and exertional dyspnea, over the.  Of last 58-month, gradually worsening.  Recently noticed significantly worsening of swelling and abdominal girth.  On presentation hemodynamically stable, on room air.  Chest x-ray with mild pulmonary congestion.  Labs with potassium of 3, BUN 19, creatinine 1.5 with baseline of 1.2-1.3.  BUN 935, troponin 34>>32  Patient was admitted for IV diuresis.  Echocardiogram ordered.  Cardiology was consulted.  3/7: Vital stable, ultrasound abdomen with minimal ascites, mild right pleural effusion and soft tissue edema.  Labs with potassium of 2.7, magnesium of 1.8, TSH normal.  Creatinine with some improvement to 1.46.  Echocardiogram with EF of 45 to 50%, regional wall motion abnormalities and grade 1 diastolic dysfunction.  Calcified aortic valve with severe aortic stenosis.   Assessment and Plan: * Acute on chronic combined systolic and diastolic CHF (congestive heart failure) (HCC) Echocardiogram with borderline low EF at 45 to 50%, grade 1 diastolic dysfunction and severe aortic stenosis. Prior echocardiogram done in 2022 with normal EF and moderate AS.  Elevated BNP at 935. Exertional dyspnea seems like a combination of CHF and aortic stenosis. -Continue IV Lasix and 40 mg twice daily -Daily weight and BMP -Strict intake and output -Cardiology is on board-appreciate their help  Aortic stenosis Severe aortic stenosis per echocardiogram done yesterday. Likely contributing to his symptoms of dyspnea. -Cardiology is on board  Hypokalemia Potassium of 2.7 with magnesium of 1.8 this  morning -Replete electrolytes and monitor  Acute urinary retention Patient has an history of BPH and was not taking any medication currently. Foley catheter was placed in ED.  BPH (benign prostatic hyperplasia) -Starting on Flomax  Essential hypertension Blood pressure currently within goal. Home amlodipine and clonidine is being held due to worsening of CHF. Home labetalol was switched with Coreg Patient is also being diuresed with IV Lasix Continue spironolactone  Paroxysmal atrial fibrillation (HCC) Currently in sinus rhythm. Home labetalol has been switched with Coreg Continue with Eliquis  CKD (chronic kidney disease), stage II Slight increase in creatinine with some improvement today, not meeting the criteria for AKI -Monitor renal function -Avoid nephrotoxins  Subjective: Patient was seen and examined today.  He was feeling weak and tired.  Physical Exam: Vitals:   01/15/24 1200 01/15/24 1230 01/15/24 1255 01/15/24 1321  BP: 135/76 (!) 130/109  118/78  Pulse: 70 76 74 77  Resp: 15 16 15    Temp:    98 F (36.7 C)  TempSrc:      SpO2: 100% 100% 100% 100%  Weight:      Height:       General.  Ill-appearing obese elderly man, in no acute distress. Pulmonary.  Lungs clear bilaterally, normal respiratory effort. CV.  Regular rate and rhythm, positive murmur Abdomen.  Soft, nontender, nondistended, BS positive. CNS.  Alert and oriented .  No focal neurologic deficit. Extremities.  2+ edema involving all extremities and abdominal wall. Psychiatry.  Judgment and insight appears normal.   Data Reviewed: Prior data reviewed  Family Communication: Talked with son on phone.  Disposition: Status is: Inpatient Remains inpatient appropriate because: Severity of illness  Planned Discharge Destination: Home  DVT Prophylaxis. Eliquis Time spent: 50 minutes  This record has been created using Conservation officer, historic buildings. Errors have been sought and  corrected,but may not always be located. Such creation errors do not reflect on the standard of care.   Author: Arnetha Courser, MD 01/15/2024 2:24 PM  For on call review [email or web address].

## 2024-01-15 NOTE — Assessment & Plan Note (Signed)
 Currently in sinus rhythm. Home labetalol has been switched with Coreg Continue with Eliquis

## 2024-01-15 NOTE — TOC CM/SW Note (Signed)
 Transition of Care [facility]) - Inpatient Brief Assessment   Patient Details  Name: Mark Blevins MRN: [id number] Date of Birth: 01-Oct-1941  Transition of [facility] [place] CM/SW Contact:    Margarito Liner, LCSW Phone Number: 01/15/2024, 1:56 PM   Clinical Narrative: CSW reviewed chart. No TOC needs identified so far. CSW will continue to follow progress. Please place [facility] consult if any needs arise.  Transition of Care Asessment: Insurance and Status: Insurance coverage has been reviewed Patient has primary care physician: Yes Home environment has been reviewed: Single family home3 Prior level of function:: Not documented Prior/Current Home Services: No current home services Social Drivers of Health Review: SDOH reviewed no interventions necessary Readmission risk has been reviewed: Yes Transition of care needs: no transition of care needs at this time

## 2024-01-15 NOTE — Progress Notes (Signed)
 PHARMACY CONSULT NOTE - ELECTROLYTES  Pharmacy Consult for Electrolyte Monitoring and Replacement   Recent Labs: Height: 6' (182.9 cm) Weight: 113.4 kg (250 lb) IBW/kg (Calculated) : 77.6 Estimated Creatinine Clearance: 49.8 mL/min (A) (by C-G formula based on SCr of 1.46 mg/dL (H)). Potassium (mmol/L)  Date Value  01/15/2024 2.7 (LL)   Magnesium (mg/dL)  Date Value  95/07/3266 1.8   Calcium (mg/dL)  Date Value  12/45/8099 8.1 (L)   Albumin (g/dL)  Date Value  83/38/2505 3.2 (L)   Phosphorus (mg/dL)  Date Value  39/76/7341 3.3   Sodium (mmol/L)  Date Value  01/15/2024 142   Corrected Ca: 8.7 mg/dL  Assessment  Mark Blevins is a 83 y.o. male presenting with acute on chronic HFpEF decompensation. PMH significant for PAF on Eliquis, HTN, chronic HFpEF, moderate aortic stenosis, CKD stage II. Pharmacy has been consulted to monitor and replace electrolytes.  Diet: Thin fluids w/ a 2000 mL fluid restriction MIVF: None Pertinent medications: Furosemide 40 mg IV bid, Potassium chloride 10 mEq PO daily, Spironolactone 12.5 mg daily   Goal of Therapy: K>4 and Mag>2 given history of Afib  Plan:  K 2.7, MD ordered KCl 40 mEq PO x 1 as well as KCl 10 mEq IV x 4 Patient also has order for KCl 10 mEq PO daily and Spironolactone 12.5 mg daily Mag 1.8, Will order Mag sulfate 2 g IV x 1 Check BMP and Mag with AM labs  Thank you for allowing pharmacy to be a part of this patient's care.  Merryl Hacker, PharmD Clinical Pharmacist 01/15/2024 8:36 AM

## 2024-01-15 NOTE — Assessment & Plan Note (Signed)
 Potassium of 2.7 with magnesium of 1.8 this morning -Replete electrolytes and monitor

## 2024-01-15 NOTE — ED Notes (Signed)
 Critical result called to provider Manuela Schwartz. Potassium 2.7. orders recieved

## 2024-01-15 NOTE — Assessment & Plan Note (Signed)
 Echocardiogram with borderline low EF at 45 to 50%, grade 1 diastolic dysfunction and severe aortic stenosis. Prior echocardiogram done in 2022 with normal EF and moderate AS.  Elevated BNP at 935. Exertional dyspnea seems like a combination of CHF and aortic stenosis. -Continue IV Lasix and 40 mg twice daily -Daily weight and BMP -Strict intake and output -Cardiology is on board-appreciate their help

## 2024-01-15 NOTE — Progress Notes (Signed)
 Initial Nutrition Assessment  DOCUMENTATION CODES:   Obesity unspecified  INTERVENTION:   -Liberalize diet to 2 gram sodium diet with 2 L fluid restriction for wider variety of meal selections -MVI with minerals daily -Magic cup BID with meals, each supplement provides 290 kcal and 9 grams of protein  -Discussed importance of sodium restriction, fluid restriction, and daily weights to assist with fluid status  NUTRITION DIAGNOSIS:   Increased nutrient needs related to chronic illness as evidenced by estimated needs.  GOAL:   Patient will meet greater than or equal to 90% of their needs  MONITOR:   PO intake, Supplement acceptance  REASON FOR ASSESSMENT:   Consult Wound healing  ASSESSMENT:   Pt with medical history significant of PAF on Eliquis, HTN, chronic HFpEF, moderate aortic stenosis, CKD stage II presented with worsening of leg swelling and exertional dyspnea.  Pt admitted with CHF.   Reviewed I/O's: -10.8 L x 24 hours  UOP: 10.8 L x 24 hours  Per CWOCN notes, pt with lt leg venous stasis ulcer.   Spoke with pt and wife at bedside. Pt pleasant and in good spirits and is happy to have been moved to a hospital room. Per pt, he did not eat anything yesterday as he was in the ED, but did finish some applesauce prior to RD visit.  Pt reports good appetite PTA. Pt typically consumes 2 meals per day (Breakfast: eggs, sausage, and toast, Dinner: meat, starch, and vegetable). Pt typically eat a small snack such as a sandwich for lunch, but does not eat much due to eating 2 large meals throughout the day. Per pt, he watches his sodium intake to assist with fluid rentention.   Reviewed wt hx; no wt loss noted over the past 3 months. Pt with severe edema, which is likely masking true weight loss as well as fat and muscle depletions. Pt would benefit from a follow-up nutrition-focused physical exam once fully diuresed.   Discussed importance of good meal and supplement  intake to promote healing.   Pt currently on a heart healthy diet with 2 L fluid restriction. RD discussed what diet comprised of; discussed importance of sodium and fluid restriction to help with fluid status. RD liberalized diet to 2 gram sodium diet with 2 L fluid restriction for wider variety of meal selections. Pt appreciative of RD visit and denies any further questions.   Medications reviewed and lasix, potassium chloride, and aldactone.   Labs reviewed: K: 2.7, CBGS: 113 (inpatient orders for glycemic control are none).    NUTRITION - FOCUSED PHYSICAL EXAM:  Flowsheet Row Most Recent Value  Orbital Region Moderate depletion  Upper Arm Region No depletion  Thoracic and Lumbar Region No depletion  Buccal Region No depletion  Temple Region Mild depletion  Clavicle Bone Region No depletion  Clavicle and Acromion Bone Region No depletion  Scapular Bone Region No depletion  Dorsal Hand No depletion  Patellar Region No depletion  Anterior Thigh Region No depletion  Posterior Calf Region No depletion  Edema (RD Assessment) Severe  Hair Reviewed  Eyes Reviewed  Mouth Reviewed  Skin Reviewed  Nails Reviewed       Diet Order:   Diet Order             Diet 2 gram sodium Fluid consistency: Thin; Fluid restriction: 2000 mL Fluid  Diet effective now                   EDUCATION NEEDS:  Education needs have been addressed  Skin:  Skin Assessment: Skin Integrity Issues: Skin Integrity Issues:: Other (Comment) Other: full thickness venous stasis ulcer to lt leg  Last BM:  01/15/24  Height:   Ht Readings from Last 1 Encounters:  01/14/24 6' (1.829 m)    Weight:   Wt Readings from Last 1 Encounters:  01/14/24 113.4 kg    Ideal Body Weight:  80.9 kg  BMI:  Body mass index is 33.91 kg/m.  Estimated Nutritional Needs:   Kcal:  2200-2400  Protein:  120-135 grams  Fluid:  2 L    Levada Schilling, RD, LDN, CDCES Registered Dietitian III Certified Diabetes  Care and Education Specialist If unable to reach this RD, please use "RD Inpatient" group chat on secure chat between hours of 8am-4 pm daily

## 2024-01-15 NOTE — Assessment & Plan Note (Signed)
 Blood pressure currently within goal. Home amlodipine and clonidine is being held due to worsening of CHF. Home labetalol was switched with Coreg Patient is also being diuresed with IV Lasix Continue spironolactone

## 2024-01-15 NOTE — Assessment & Plan Note (Signed)
 Slight increase in creatinine with some improvement today, not meeting the criteria for AKI -Monitor renal function -Avoid nephrotoxins

## 2024-01-16 DIAGNOSIS — E876 Hypokalemia: Secondary | ICD-10-CM | POA: Diagnosis not present

## 2024-01-16 DIAGNOSIS — I5043 Acute on chronic combined systolic (congestive) and diastolic (congestive) heart failure: Secondary | ICD-10-CM | POA: Diagnosis not present

## 2024-01-16 DIAGNOSIS — R338 Other retention of urine: Secondary | ICD-10-CM | POA: Diagnosis not present

## 2024-01-16 DIAGNOSIS — I350 Nonrheumatic aortic (valve) stenosis: Secondary | ICD-10-CM | POA: Diagnosis not present

## 2024-01-16 LAB — BASIC METABOLIC PANEL
Anion gap: 9 (ref 5–15)
BUN: 16 mg/dL (ref 8–23)
CO2: 29 mmol/L (ref 22–32)
Calcium: 7.7 mg/dL — ABNORMAL LOW (ref 8.9–10.3)
Chloride: 98 mmol/L (ref 98–111)
Creatinine, Ser: 1.29 mg/dL — ABNORMAL HIGH (ref 0.61–1.24)
GFR, Estimated: 55 mL/min — ABNORMAL LOW (ref >60)
Glucose, Bld: 100 mg/dL — ABNORMAL HIGH (ref 70–99)
Potassium: 2.6 mmol/L — CL (ref 3.5–5.1)
Sodium: 136 mmol/L (ref 135–145)

## 2024-01-16 LAB — POTASSIUM: Potassium: 3 mmol/L — ABNORMAL LOW (ref 3.5–5.1)

## 2024-01-16 LAB — MAGNESIUM: Magnesium: 1.8 mg/dL (ref 1.7–2.4)

## 2024-01-16 MED ORDER — POTASSIUM CHLORIDE CRYS ER 20 MEQ PO TBCR
40.0000 meq | EXTENDED_RELEASE_TABLET | Freq: Once | ORAL | Status: AC
  Administered 2024-01-16: 40 meq via ORAL
  Filled 2024-01-16: qty 2

## 2024-01-16 MED ORDER — POTASSIUM CHLORIDE 10 MEQ/100ML IV SOLN
10.0000 meq | INTRAVENOUS | Status: AC
  Administered 2024-01-16 (×4): 10 meq via INTRAVENOUS
  Filled 2024-01-16 (×4): qty 100

## 2024-01-16 MED ORDER — POTASSIUM CHLORIDE 10 MEQ/100ML IV SOLN
10.0000 meq | INTRAVENOUS | Status: AC
Start: 2024-01-16 — End: 2024-01-16
  Administered 2024-01-16 (×4): 10 meq via INTRAVENOUS
  Filled 2024-01-16 (×4): qty 100

## 2024-01-16 MED ORDER — MAGNESIUM OXIDE -MG SUPPLEMENT 400 (240 MG) MG PO TABS
400.0000 mg | ORAL_TABLET | Freq: Once | ORAL | Status: AC
Start: 2024-01-16 — End: 2024-01-16
  Administered 2024-01-16: 400 mg via ORAL
  Filled 2024-01-16: qty 1

## 2024-01-16 NOTE — Assessment & Plan Note (Signed)
 Patient has an history of BPH and was not taking any medication currently. Foley catheter was placed in ED.

## 2024-01-16 NOTE — Progress Notes (Signed)
       CROSS COVER NOTE  NAME: Mark Blevins MRN: [id number] DOB : 1941-06-12 ATTENDING PHYSICIAN: Arnetha Courser, MD    Date of Service   01/16/2024   HPI/Events of Note   K 2.6 Mag 1.8  Interventions   Assessment/Plan: Potassium 40 oral 40 IV Mag oxide 400  Re[peat K at 1600       Donnie Mesa NP [facility] Cross Cover 7pm-7am - check amion for availability Pager [phone]

## 2024-01-16 NOTE — Progress Notes (Signed)
 Progress Note   Patient: Mark Blevins DOB: Mar 20, 1941 DOA: 01/14/2024     2 DOS: the patient was seen and examined on 01/16/2024   Brief hospital course: Taken from H&P.  Mark Blevins is a 83 y.o. male with medical history significant of PAF on Eliquis, HTN, chronic HFpEF, moderate aortic stenosis, CKD stage II presented with worsening of leg swelling and exertional dyspnea, over the.  Of last 45-month, gradually worsening.  Recently noticed significantly worsening of swelling and abdominal girth.  On presentation hemodynamically stable, on room air.  Chest x-ray with mild pulmonary congestion.  Labs with potassium of 3, BUN 19, creatinine 1.5 with baseline of 1.2-1.3.  BUN 935, troponin 34>>32  Patient was admitted for IV diuresis.  Echocardiogram ordered.  Cardiology was consulted.  3/7: Vital stable, ultrasound abdomen with minimal ascites, mild right pleural effusion and soft tissue edema.  Labs with potassium of 2.7, magnesium of 1.8, TSH normal.  Creatinine with some improvement to 1.46.  Echocardiogram with EF of 45 to 50%, regional wall motion abnormalities and grade 1 diastolic dysfunction.  Calcified aortic valve with severe aortic stenosis.   3/8: Hemodynamically stable, good UOP but weight recorded is slight increase than before which does not make sense.  Significant hypokalemia and mild hypomagnesemia which are being repleted.  Per cardiology he might be a poor candidate for TAVR or SAVR but they will evaluate as outpatient.  Continue IV diuresis and replating electrolytes  Assessment and Plan: * Acute on chronic combined systolic and diastolic CHF (congestive heart failure) (HCC) Echocardiogram with borderline low EF at 45 to 50%, grade 1 diastolic dysfunction and severe aortic stenosis. Prior echocardiogram done in 2022 with normal EF and moderate AS.  Elevated BNP at 935. Exertional dyspnea seems like a combination of CHF and aortic stenosis. -Continue IV Lasix  and 40 mg twice daily -Daily weight and BMP -Strict intake and output -Cardiology is on board-appreciate their help  Aortic stenosis Severe aortic stenosis per echocardiogram done yesterday. Likely contributing to his symptoms of dyspnea. -Cardiology is on board -Might not be a good candidate for TAVR/SAVR but they will reevaluate as outpatient  Hypokalemia Potassium of 2.6 with magnesium of 1.8 this morning -Replete electrolytes and monitor  Acute urinary retention Patient has an history of BPH and was not taking any medication currently. Foley catheter was placed in ED.  BPH (benign prostatic hyperplasia) -Starting on Flomax  Essential hypertension Blood pressure currently within goal. Home amlodipine and clonidine is being held due to worsening of CHF. Home labetalol was switched with Coreg Patient is also being diuresed with IV Lasix Continue spironolactone  Paroxysmal atrial fibrillation (HCC) Currently in sinus rhythm. Home labetalol has been switched with Coreg Continue with Eliquis  CKD (chronic kidney disease), stage II Slight increase in creatinine with some improvement today, not meeting the criteria for AKI -Monitor renal function -Avoid nephrotoxins  Subjective: Patient was seen and examined today.  No new concern.  Physical Exam: Vitals:   01/16/24 0500 01/16/24 0834 01/16/24 1147 01/16/24 1520  BP:  (!) 91/53 115/65 100/67  Pulse:  75 77 78  Resp:  16 18 16   Temp:  98.1 F (36.7 C) 98.8 F (37.1 C) 98.3 F (36.8 C)  TempSrc:  Oral Oral Oral  SpO2:  90% 92% 94%  Weight: 114.1 kg     Height:       General.  Frail elderly man, in no acute distress. Pulmonary.  Lungs clear bilaterally, normal  respiratory effort. CV.  Regular rate and rhythm, no JVD, rub or murmur. Abdomen.  Soft, nontender, nondistended, BS positive. CNS.  Alert and oriented .  No focal neurologic deficit. Extremities.  2+ LE edema, abdominal wall edema, improving upper  extremity edema, bilateral lower extremities wrapped.  Data Reviewed: Prior data reviewed  Family Communication: Talked with granddaughter on phone  Disposition: Status is: Inpatient Remains inpatient appropriate because: Severity of illness  Planned Discharge Destination: Home  DVT Prophylaxis. Eliquis Time spent: 50 minutes  This record has been created using Conservation officer, historic buildings. Errors have been sought and corrected,but may not always be located. Such creation errors do not reflect on the standard of care.   Author: Arnetha Courser, MD 01/16/2024 3:49 PM  For on call review [email or web address].

## 2024-01-16 NOTE — Progress Notes (Signed)
 Patient ID: Mark Blevins, male   DOB: 1941/02/10, 83 y.o.   MRN: [id number] [facility] Cardiology    SUBJECTIVE: Patient resting comfortably denies any chest pain denies any shortness of breath and and swelling denies any significant fever chills.   Vitals:   01/16/24 0433 01/16/24 0500 01/16/24 0834 01/16/24 1147  BP: (!) 116/55  (!) 91/53 115/65  Pulse: 70  75 77  Resp: 18  16 18   Temp: 98.7 F (37.1 C)  98.1 F (36.7 C) 98.8 F (37.1 C)  TempSrc: Oral  Oral Oral  SpO2: 96%  90% 92%  Weight:  114.1 kg    Height:         Intake/Output Summary (Last 24 hours) at 01/16/2024 1439 Last data filed at 01/16/2024 1137 Gross per 24 hour  Intake 365.99 ml  Output 2850 ml  Net -2484.01 ml      PHYSICAL EXAM  General: Well developed, well nourished, in no acute distress HEENT:  Normocephalic and atramatic Neck:  No JVD.  Lungs: Clear bilaterally to auscultation and percussion. Heart: HRRR . Normal S1 and S2 without gallops or murmurs.  Abdomen: Bowel sounds are positive, abdomen soft and non-tender  Msk:  Back normal, normal gait. Normal strength and tone for age. Extremities: No clubbing, cyanosis or 3+edema.   Neuro: Alert and oriented X 3. Psych:  Good affect, responds appropriately   LABS: Basic Metabolic Panel: Recent Labs    01/14/24 0710 01/15/24 0440 01/16/24 0515 01/16/24 1302  NA 139 142 136  --   K 3.0* 2.7* 2.6* 3.0*  CL 106 102 98  --   CO2 23 26 29   --   GLUCOSE 107* 88 100*  --   BUN 19 16 16   --   CREATININE 1.57* 1.46* 1.29*  --   CALCIUM 8.2* 8.1* 7.7*  --   MG 2.0 1.8 1.8  --   PHOS 3.3  --   --   --    Liver Function Tests: Recent Labs    01/14/24 0710  AST 28  ALT 17  ALKPHOS 64  BILITOT 1.0  PROT 6.2*  ALBUMIN 3.2*   No results for input(s): "LIPASE", "AMYLASE" in the last 72 hours. CBC: Recent Labs    01/14/24 0710  WBC 3.4*  HGB 10.2*  HCT 31.1*  MCV 99.7  PLT 147*   Cardiac Enzymes: No results for input(s): "CKTOTAL", "CKMB",  "CKMBINDEX", "TROPONINI" in the last 72 hours. BNP: Invalid input(s): "POCBNP" D-Dimer: No results for input(s): "DDIMER" in the last 72 hours. Hemoglobin A1C: No results for input(s): "HGBA1C" in the last 72 hours. Fasting Lipid Panel: No results for input(s): "CHOL", "HDL", "LDLCALC", "TRIG", "CHOLHDL", "LDLDIRECT" in the last 72 hours. Thyroid Function Tests: Recent Labs    01/14/24 1010  TSH 2.858   Anemia Panel: No results for input(s): "VITAMINB12", "FOLATE", "FERRITIN", "TIBC", "IRON", "RETICCTPCT" in the last 72 hours.  DG Chest 1 View Result Date: 01/15/2024 CLINICAL DATA:  83 year old male with history of congestive heart failure. EXAM: CHEST  1 VIEW COMPARISON:  Chest x-ray 01/14/2024. FINDINGS: Lung volumes are low. No confluent consolidative airspace disease. No pleural effusions. No pneumothorax. No evidence of pulmonary edema. Heart size is mildly enlarged. The patient is rotated to the right on today's exam, resulting in distortion of the mediastinal contours and reduced diagnostic sensitivity and specificity for mediastinal pathology. IMPRESSION: 1. Low lung volumes without radiographic evidence of acute cardiopulmonary disease. 2. Mild cardiomegaly. Electronically Signed   By: Reuel Boom  Entrikin M.D.   On: 01/15/2024 07:22   ECHOCARDIOGRAM COMPLETE Result Date: 01/14/2024    ECHOCARDIOGRAM REPORT   Patient Name:   Mark Blevins Date of Exam: 01/14/2024 Medical Rec #:  [id number]     Height:       72.0 in Accession #:    [id number]    Weight:       250.0 lb Date of Birth:  1941-05-25     BSA:          2.343 m Patient Age:    82 years      BP:           146/69 mmHg Patient Gender: M             HR:           68 bpm. Exam Location:  [facility] Procedure: 2D Echo, Cardiac Doppler and Color Doppler (Both Spectral and Color            Flow Doppler were utilized during procedure). Indications:     CHF-acute diastolic I50.31  History:         Patient has no prior history of Echocardiogram  examinations.                  Arrythmias:Atrial Fibrillation; Risk Factors:Hypertension.  Sonographer:     Cristela Blue Referring Phys:  [id number] CARALYN HUDSON Diagnosing Phys: Alwyn Pea MD IMPRESSIONS  1. Left ventricular ejection fraction, by estimation, is 45 to 50%. The left ventricle has normal function. The left ventricle demonstrates regional wall motion abnormalities (see scoring diagram/findings for description). There is moderate concentric left ventricular hypertrophy. Left ventricular diastolic parameters are consistent with Grade I diastolic dysfunction (impaired relaxation).  2. Right ventricular systolic function is normal. The right ventricular size is normal.  3. The mitral valve is normal in structure. No evidence of mitral valve regurgitation.  4. The aortic valve is calcified. Aortic valve regurgitation is mild. Severe aortic valve stenosis. FINDINGS  Left Ventricle: Ant/apical/setal hypo. Left ventricular ejection fraction, by estimation, is 45 to 50%. The left ventricle has normal function. The left ventricle demonstrates regional wall motion abnormalities. Strain was performed and the global longitudinal strain is indeterminate. The left ventricular internal cavity size was normal in size. There is moderate concentric left ventricular hypertrophy. Left ventricular diastolic parameters are consistent with Grade I diastolic dysfunction (impaired relaxation). Right Ventricle: The right ventricular size is normal. No increase in right ventricular wall thickness. Right ventricular systolic function is normal. Left Atrium: Left atrial size was normal in size. Right Atrium: Right atrial size was normal in size. Pericardium: There is no evidence of pericardial effusion. Mitral Valve: The mitral valve is normal in structure. No evidence of mitral valve regurgitation. MV peak gradient, 10.4 mmHg. The mean mitral valve gradient is 5.0 mmHg. Tricuspid Valve: The tricuspid valve is normal in  structure. Tricuspid valve regurgitation is mild. Aortic Valve: The aortic valve is calcified. Aortic valve regurgitation is mild. Severe aortic stenosis is present. Aortic valve mean gradient measures 29.8 mmHg. Aortic valve peak gradient measures 53.7 mmHg. Aortic valve area, by VTI measures 0.58 cm. Pulmonic Valve: The pulmonic valve was normal in structure. Pulmonic valve regurgitation is not visualized. Aorta: The ascending aorta was not well visualized. IAS/Shunts: No atrial level shunt detected by color flow Doppler. Additional Comments: 3D was performed not requiring image post processing on an independent workstation and was indeterminate.  LEFT VENTRICLE PLAX 2D LVIDd:  4.70 cm LVIDs:         3.30 cm LV PW:         1.70 cm LV IVS:        1.50 cm LVOT diam:     2.00 cm LV SV:         48 LV SV Index:   21 LVOT Area:     3.14 cm  RIGHT VENTRICLE RV Basal diam:  4.50 cm RV Mid diam:    3.50 cm RV S prime:     9.03 cm/s TAPSE (M-mode): 2.2 cm LEFT ATRIUM              Index        RIGHT ATRIUM           Index LA diam:        3.40 cm  1.45 cm/m   RA Area:     23.60 cm LA Vol (A2C):   104.0 ml 44.40 ml/m  RA Volume:   72.00 ml  30.74 ml/m LA Vol (A4C):   134.0 ml 57.20 ml/m LA Biplane Vol: 121.0 ml 51.65 ml/m  AORTIC VALVE AV Area (Vmax):    0.51 cm AV Area (Vmean):   0.52 cm AV Area (VTI):     0.58 cm AV Vmax:           366.25 cm/s AV Vmean:          252.000 cm/s AV VTI:            0.829 m AV Peak Grad:      53.7 mmHg AV Mean Grad:      29.8 mmHg LVOT Vmax:         59.10 cm/s LVOT Vmean:        41.800 cm/s LVOT VTI:          0.153 m LVOT/AV VTI ratio: 0.18  AORTA Ao Root diam: 3.30 cm MITRAL VALVE                TRICUSPID VALVE MV Area (PHT): 5.58 cm     TR Peak grad:   44.1 mmHg MV Area VTI:   1.55 cm     TR Vmax:        332.00 cm/s MV Peak grad:  10.4 mmHg MV Mean grad:  5.0 mmHg     SHUNTS MV Vmax:       1.61 m/s     Systemic VTI:  0.15 m MV Vmean:      99.2 cm/s    Systemic Diam: 2.00 cm  MV Decel Time: 136 msec MV E velocity: 156.00 cm/s Neal Oshea D Britaney Espaillat MD Electronically signed by Alwyn Pea MD Signature Date/Time: 01/14/2024/4:35:16 PM    Final      Echo EF around 45-50 with severe aortic stenosis valve area less than 0.6  TELEMETRY: Atrial fibrillation rate of 75:  ASSESSMENT AND PLAN:  Principal Problem:   Acute on chronic combined systolic and diastolic CHF (congestive heart failure) (HCC) Active Problems:   Essential hypertension   Paroxysmal atrial fibrillation (HCC)   Aortic stenosis   BPH (benign prostatic hyperplasia)   CKD (chronic kidney disease), stage II   Acute urinary retention   Hypokalemia    Plan Severe aortic stenosis valve area less than 0.6 cm currently not a ideal candidate for TAVR or SAVR will try to optimize the patient condition Chronic congestive heart failure diastolic dysfunction 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

## 2024-01-16 NOTE — Assessment & Plan Note (Signed)
 Resolved. Will decrease potassium to 20 mEq daily due to decrease in dose of torsemide. Chemistry to be drawn and reported to facility physician on 01/25/2024.

## 2024-01-16 NOTE — Assessment & Plan Note (Signed)
 Blood pressure currently within goal. Home amlodipine and clonidine is being held due to worsening of CHF. Home labetalol was switched with Coreg Patient is also being diuresed with IV Lasix Continue spironolactone

## 2024-01-16 NOTE — Assessment & Plan Note (Signed)
 Severe aortic stenosis per echocardiogram done yesterday. Likely contributing to his symptoms of dyspnea. -Cardiology is on board -Might not be a good candidate for TAVR/SAVR but they will reevaluate as outpatient -Pt is to follow up with cardiology as outpatient.

## 2024-01-16 NOTE — Plan of Care (Signed)

## 2024-01-16 NOTE — Progress Notes (Signed)
 PHARMACY CONSULT NOTE - ELECTROLYTES  Pharmacy Consult for Electrolyte Monitoring and Replacement   Recent Labs: Height: 6' (182.9 cm) Weight: 114.1 kg (251 lb 8.7 oz) IBW/kg (Calculated) : 77.6 Estimated Creatinine Clearance: 56.6 mL/min (A) (by C-G formula based on SCr of 1.29 mg/dL (H)). Potassium (mmol/L)  Date Value  01/16/2024 2.6 (LL)   Magnesium (mg/dL)  Date Value  91/47/8295 1.8   Calcium (mg/dL)  Date Value  62/13/0865 7.7 (L)   Albumin (g/dL)  Date Value  78/46/9629 3.2 (L)   Phosphorus (mg/dL)  Date Value  52/84/1324 3.3   Sodium (mmol/L)  Date Value  01/16/2024 136   Corrected Ca: 8.7 mg/dL  Assessment  Mark Blevins is a 83 y.o. male presenting with acute on chronic HFpEF decompensation. PMH significant for PAF on Eliquis, HTN, chronic HFpEF, moderate aortic stenosis, CKD stage II. Pharmacy has been consulted to monitor and replace electrolytes.  Diet: Thin fluids w/ a 2000 mL fluid restriction MIVF: None Pertinent medications: Furosemide 40 mg IV bid, Potassium chloride 10 mEq PO daily, Spironolactone 12.5 mg daily   Goal of Therapy: K>4 and Mag>2 given history of Afib  Plan:  K 2.7, MD ordered KCl 40 mEq PO x 1 as well as KCl 10 mEq IV x 4 Patient also has order for KCl 10 mEq PO daily and Spironolactone 12.5 mg daily Mag 1.8, Mag Oxide 400mg  PO x 1 ordered  Check BMP and Mag with AM labs  Thank you for allowing pharmacy to be a part of this patient's care.  Bettey Costa, PharmD Clinical Pharmacist 01/16/2024 7:20 AM

## 2024-01-16 NOTE — Assessment & Plan Note (Signed)
 Echocardiogram with borderline low EF at 45 to 50%, grade 1 diastolic dysfunction and severe aortic stenosis. Prior echocardiogram done in 2022 with normal EF and moderate AS.  Elevated BNP at 935. Exertional dyspnea seems like a combination of CHF and aortic stenosis. -Continue IV Lasix and 40 mg twice daily -Daily weight and BMP -Strict intake and output -Cardiology is on board-appreciate their help

## 2024-01-16 NOTE — Progress Notes (Signed)
 Patient complaining of IV burning multiple times, secondary to potassium IV replacement, pt confirmed IV site feels better after flushing with NS, offered to place second IV site and switch IV sites, patient declined. Will continue to monitor.

## 2024-01-17 DIAGNOSIS — I350 Nonrheumatic aortic (valve) stenosis: Secondary | ICD-10-CM | POA: Diagnosis not present

## 2024-01-17 DIAGNOSIS — E876 Hypokalemia: Secondary | ICD-10-CM | POA: Diagnosis not present

## 2024-01-17 DIAGNOSIS — I5043 Acute on chronic combined systolic (congestive) and diastolic (congestive) heart failure: Secondary | ICD-10-CM | POA: Diagnosis not present

## 2024-01-17 DIAGNOSIS — R338 Other retention of urine: Secondary | ICD-10-CM | POA: Diagnosis not present

## 2024-01-17 LAB — BASIC METABOLIC PANEL
Anion gap: 9 (ref 5–15)
BUN: 15 mg/dL (ref 8–23)
CO2: 30 mmol/L (ref 22–32)
Calcium: 7.6 mg/dL — ABNORMAL LOW (ref 8.9–10.3)
Chloride: 95 mmol/L — ABNORMAL LOW (ref 98–111)
Creatinine, Ser: 1.15 mg/dL (ref 0.61–1.24)
GFR, Estimated: >60 mL/min (ref >60)
Glucose, Bld: 128 mg/dL — ABNORMAL HIGH (ref 70–99)
Potassium: 3.2 mmol/L — ABNORMAL LOW (ref 3.5–5.1)
Sodium: 134 mmol/L — ABNORMAL LOW (ref 135–145)

## 2024-01-17 LAB — MAGNESIUM: Magnesium: 1.7 mg/dL (ref 1.7–2.4)

## 2024-01-17 LAB — PHOSPHORUS: Phosphorus: 2.3 mg/dL — ABNORMAL LOW (ref 2.5–4.6)

## 2024-01-17 MED ORDER — POTASSIUM CHLORIDE 10 MEQ/100ML IV SOLN
10.0000 meq | INTRAVENOUS | Status: AC
  Administered 2024-01-17 (×4): 10 meq via INTRAVENOUS
  Filled 2024-01-17 (×4): qty 100

## 2024-01-17 MED ORDER — K PHOS MONO-SOD PHOS DI & MONO 155-852-130 MG PO TABS
500.0000 mg | ORAL_TABLET | ORAL | Status: AC
  Administered 2024-01-17 (×2): 500 mg via ORAL
  Filled 2024-01-17 (×2): qty 2

## 2024-01-17 MED ORDER — POTASSIUM CHLORIDE CRYS ER 20 MEQ PO TBCR
40.0000 meq | EXTENDED_RELEASE_TABLET | Freq: Once | ORAL | Status: AC
  Administered 2024-01-17: 40 meq via ORAL
  Filled 2024-01-17: qty 2

## 2024-01-17 MED ORDER — CALCIUM CARBONATE 1250 (500 CA) MG PO TABS
1.0000 | ORAL_TABLET | Freq: Two times a day (BID) | ORAL | Status: DC
  Administered 2024-01-17 – 2024-01-22 (×11): 1250 mg via ORAL
  Filled 2024-01-17 (×11): qty 1

## 2024-01-17 MED ORDER — MAGNESIUM SULFATE 2 GM/50ML IV SOLN
2.0000 g | Freq: Once | INTRAVENOUS | Status: AC
  Administered 2024-01-17: 2 g via INTRAVENOUS
  Filled 2024-01-17: qty 50

## 2024-01-17 NOTE — Progress Notes (Signed)
 Progress Note   Patient: Mark Blevins NFA:[id number] DOB: [birth year] DOA: 01/14/2024     3 DOS: the patient was seen and examined on 01/17/2024   Brief hospital course: Taken from H&P.  YANDEL ZEINER is a 83 y.o. male with medical history significant of PAF on Eliquis, HTN, chronic HFpEF, moderate aortic stenosis, CKD stage II presented with worsening of leg swelling and exertional dyspnea, over the.  Of last 25-month, gradually worsening.  Recently noticed significantly worsening of swelling and abdominal girth.  On presentation hemodynamically stable, on room air.  Chest x-ray with mild pulmonary congestion.  Labs with potassium of 3, BUN 19, creatinine 1.5 with baseline of 1.2-1.3.  BUN 935, troponin 34>>32  Patient was admitted for IV diuresis.  Echocardiogram ordered.  Cardiology was consulted.  3/7: Vital stable, ultrasound abdomen with minimal ascites, mild right pleural effusion and soft tissue edema.  Labs with potassium of 2.7, magnesium of 1.8, TSH normal.  Creatinine with some improvement to 1.46.  Echocardiogram with EF of 45 to 50%, regional wall motion abnormalities and grade 1 diastolic dysfunction.  Calcified aortic valve with severe aortic stenosis.   3/8: Hemodynamically stable, good UOP but weight recorded is slight increase than before which does not make sense.  Significant hypokalemia and mild hypomagnesemia which are being repleted.  Per cardiology he might be a poor candidate for TAVR or SAVR but they will evaluate as outpatient.  Continue IV diuresis and replating electrolytes.  3/9: Hemodynamically stable, continuing diuresis with slowly improving creatinine.  Mild hypokalemia, hypophosphatemia and hypomagnesemia which is being repleted.  Assessment and Plan: * Acute on chronic combined systolic and diastolic CHF (congestive heart failure) (HCC) Echocardiogram with borderline low EF at 45 to 50%, grade 1 diastolic dysfunction and severe aortic stenosis. Prior  echocardiogram done in 2022 with normal EF and moderate AS.  Elevated BNP at 935. Exertional dyspnea seems like a combination of CHF and aortic stenosis.  Diuresing well with slowly improving creatinine at this time. -Continue IV Lasix and 40 mg twice daily -Daily weight and BMP -Strict intake and output -Cardiology is on board-appreciate their help  Aortic stenosis Severe aortic stenosis per echocardiogram done yesterday. Likely contributing to his symptoms of dyspnea. -Cardiology is on board -Might not be a good candidate for TAVR/SAVR but they will reevaluate as outpatient  Hypokalemia Multiple electrolyte abnormalities today with mild hypokalemia at 3.2, hypophosphatemia 2.3 and hypomagnesemia at 1.7 -Replete electrolytes and monitor  Acute urinary retention Patient has an history of BPH and was not taking any medication currently. Foley catheter was placed in ED.  BPH (benign prostatic hyperplasia) -Starting on Flomax  Essential hypertension Blood pressure currently within goal. Home amlodipine and clonidine is being held due to worsening of CHF. Home labetalol was switched with Coreg Patient is also being diuresed with IV Lasix Continue spironolactone  Paroxysmal atrial fibrillation (HCC) Currently in sinus rhythm. Home labetalol has been switched with Coreg Continue with Eliquis  CKD (chronic kidney disease), stage II Slight increase in creatinine with some improvement today, not meeting the criteria for AKI -Monitor renal function -Avoid nephrotoxins  Subjective: Patient with no new concern.  Shortness of breath improving.  Physical Exam: Vitals:   01/16/24 2318 01/17/24 0412 01/17/24 0754 01/17/24 1154  BP: 105/69 118/66 118/65 104/61  Pulse: 75 74 77 70  Resp: 18 18    Temp: 98.8 F (37.1 C) 99.1 F (37.3 C) 98.8 F (37.1 C) 98.5 F (36.9 C)  TempSrc: Oral Axillary  SpO2: 98% 98% 96% 97%  Weight:  114.7 kg    Height:       General.  Frail elderly  man, in no acute distress. Pulmonary.  Lungs clear bilaterally, normal respiratory effort. CV.  Regular rate and rhythm, no JVD, rub or murmur. Abdomen.  Soft, nontender, nondistended, BS positive. CNS.  Alert and oriented .  No focal neurologic deficit. Extremities.  1+ edema involving all extremities and abdominal wall, no cyanosis, pulses intact and symmetrical.  Data Reviewed: Prior data reviewed  Family Communication: Discussed with wife at bedside  Disposition: Status is: Inpatient Remains inpatient appropriate because: Severity of illness  Planned Discharge Destination: Home  DVT Prophylaxis. Eliquis Time spent: 45 minutes  This record has been created using Conservation officer, historic buildings. Errors have been sought and corrected,but may not always be located. Such creation errors do not reflect on the standard of care.   Author: Arnetha Courser, MD 01/17/2024 3:19 PM  For on call review [email or web address].

## 2024-01-17 NOTE — Plan of Care (Signed)

## 2024-01-17 NOTE — Plan of Care (Signed)

## 2024-01-17 NOTE — Progress Notes (Addendum)
 PHARMACY CONSULT NOTE - ELECTROLYTES  Pharmacy Consult for Electrolyte Monitoring and Replacement   Recent Labs: Height: 6' (182.9 cm) Weight: 114.7 kg (252 lb 13.9 oz) IBW/kg (Calculated) : 77.6 Estimated Creatinine Clearance: 63.6 mL/min (by C-G formula based on SCr of 1.15 mg/dL). Potassium (mmol/L)  Date Value  01/17/2024 3.2 (L)   Magnesium (mg/dL)  Date Value  95/62/1308 1.7   Calcium (mg/dL)  Date Value  65/78/4696 7.6 (L)   Albumin (g/dL)  Date Value  29/52/8413 3.2 (L)   Phosphorus (mg/dL)  Date Value  24/40/1027 2.3 (L)   Sodium (mmol/L)  Date Value  01/17/2024 134 (L)   Corrected Ca: 8.7 mg/dL  Assessment  Mark Blevins is a 83 y.o. male presenting with acute on chronic HFpEF decompensation. PMH significant for PAF on Eliquis, HTN, chronic HFpEF, moderate aortic stenosis, CKD stage II. Pharmacy has been consulted to monitor and replace electrolytes.  Diet: low sodium(<2g daily) with thin fluids only(fluid restriction<2L) MIVF: None Pertinent medications: Furosemide 40 mg IV bid, Potassium chloride 10 mEq PO daily, Spironolactone 12.5 mg daily   Goal of Therapy: K>4 and Mag>2 given history of Afib  Plan:  K 3.2: KCl 40 mEq PO x 1 as well as KCl 10 mEq IV x 4 Patient also has order for KCl 10 mEq PO daily and Spironolactone 12.5 mg daily Mag 1.7, Mag sulfate 2g IV x 1 Phos 2.3: Kphos 500mg  PO x 2 Check BMP and Mag with AM labs  Thank you for allowing pharmacy to be a part of this patient's care.  Bettey Costa, PharmD Clinical Pharmacist 01/17/2024 7:13 AM

## 2024-01-17 NOTE — Progress Notes (Signed)
 Patient ID: Mark Blevins, male   DOB: 01-04-1941, 83 y.o.   MRN: [id number] [facility] Cardiology    SUBJECTIVE: Resting comfortably slightly less anasarca shortness of breath and swelling has not been as ambulatory as he showed denies any chest pain no palpitations or tachycardia   Vitals:   01/16/24 2318 01/17/24 0412 01/17/24 0754 01/17/24 1154  BP: 105/69 118/66 118/65 104/61  Pulse: 75 74 77 70  Resp: 18 18    Temp: 98.8 F (37.1 C) 99.1 F (37.3 C) 98.8 F (37.1 C) 98.5 F (36.9 C)  TempSrc: Oral Axillary    SpO2: 98% 98% 96% 97%  Weight:  114.7 kg    Height:         Intake/Output Summary (Last 24 hours) at 01/17/2024 1209 Last data filed at 01/17/2024 1159 Gross per 24 hour  Intake 699.01 ml  Output 5150 ml  Net -4450.99 ml      PHYSICAL EXAM  General: Well developed, well nourished, in no acute distress neurolyse anasarca HEENT:  Normocephalic and atramatic Neck:  No JVD.  Lungs: Clear bilaterally to auscultation and percussion. Heart: HRRR . Normal S1 and S2 without gallops or 2/6 sem murmurs.  Abdomen: Bowel sounds are positive, abdomen soft and non-tender edema Msk:  Back normal, normal gait. Normal strength and tone for age. Extremities: No clubbing, cyanosis or 3+edema.   Neuro: Alert and oriented X 3. Psych:  Good affect, responds appropriately   LABS: Basic Metabolic Panel: Recent Labs    01/16/24 0515 01/16/24 1302 01/17/24 0459  NA 136  --  134*  K 2.6* 3.0* 3.2*  CL 98  --  95*  CO2 29  --  30  GLUCOSE 100*  --  128*  BUN 16  --  15  CREATININE 1.29*  --  1.15  CALCIUM 7.7*  --  7.6*  MG 1.8  --  1.7  PHOS  --   --  2.3*   Liver Function Tests: No results for input(s): "AST", "ALT", "ALKPHOS", "BILITOT", "PROT", "ALBUMIN" in the last 72 hours. No results for input(s): "LIPASE", "AMYLASE" in the last 72 hours. CBC: No results for input(s): "WBC", "NEUTROABS", "HGB", "HCT", "MCV", "PLT" in the last 72 hours. Cardiac Enzymes: No results for  input(s): "CKTOTAL", "CKMB", "CKMBINDEX", "TROPONINI" in the last 72 hours. BNP: Invalid input(s): "POCBNP" D-Dimer: No results for input(s): "DDIMER" in the last 72 hours. Hemoglobin A1C: No results for input(s): "HGBA1C" in the last 72 hours. Fasting Lipid Panel: No results for input(s): "CHOL", "HDL", "LDLCALC", "TRIG", "CHOLHDL", "LDLDIRECT" in the last 72 hours. Thyroid Function Tests: No results for input(s): "TSH", "T4TOTAL", "T3FREE", "THYROIDAB" in the last 72 hours.  Invalid input(s): "FREET3" Anemia Panel: No results for input(s): "VITAMINB12", "FOLATE", "FERRITIN", "TIBC", "IRON", "RETICCTPCT" in the last 72 hours.  No results found.   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  TELEMETRY: Atrial fibrillation rate of 82:  ASSESSMENT AND PLAN:  Principal Problem:   Acute on chronic combined systolic and diastolic CHF (congestive heart failure) (HCC) Active Problems:   Essential hypertension   Paroxysmal atrial fibrillation (HCC)   Aortic stenosis   BPH (benign prostatic hyperplasia)   CKD (chronic kidney disease), stage II   Acute urinary retention   Hypokalemia    Plan Acute on chronic systolic congestive heart failure with anasarca continue diuretic therapy Severe aortic stenosis valve area 0.6 cm consider TAVR once patient is optimized probably a poor  SAVR candidate Chronic renal sufficiency stage II continue diuretic therapy Paroxysmal atrial fibrillation anticoagulation therapy rate control Obesity recommend weight loss exercise portion control Hypokalemia continue to replenish correct electrolytes Consider referral to heart failure clinic  Alwyn Pea, MD 01/17/2024 12:09 PM

## 2024-01-18 DIAGNOSIS — E876 Hypokalemia: Secondary | ICD-10-CM | POA: Diagnosis not present

## 2024-01-18 DIAGNOSIS — R338 Other retention of urine: Secondary | ICD-10-CM | POA: Diagnosis not present

## 2024-01-18 DIAGNOSIS — I350 Nonrheumatic aortic (valve) stenosis: Secondary | ICD-10-CM | POA: Diagnosis not present

## 2024-01-18 DIAGNOSIS — I5043 Acute on chronic combined systolic (congestive) and diastolic (congestive) heart failure: Secondary | ICD-10-CM | POA: Diagnosis not present

## 2024-01-18 LAB — BASIC METABOLIC PANEL
Anion gap: 10 (ref 5–15)
BUN: 16 mg/dL (ref 8–23)
CO2: 30 mmol/L (ref 22–32)
Calcium: 7.6 mg/dL — ABNORMAL LOW (ref 8.9–10.3)
Chloride: 93 mmol/L — ABNORMAL LOW (ref 98–111)
Creatinine, Ser: 1.26 mg/dL — ABNORMAL HIGH (ref 0.61–1.24)
GFR, Estimated: 57 mL/min — ABNORMAL LOW (ref >60)
Glucose, Bld: 116 mg/dL — ABNORMAL HIGH (ref 70–99)
Potassium: 3 mmol/L — ABNORMAL LOW (ref 3.5–5.1)
Sodium: 133 mmol/L — ABNORMAL LOW (ref 135–145)

## 2024-01-18 LAB — MAGNESIUM: Magnesium: 1.9 mg/dL (ref 1.7–2.4)

## 2024-01-18 LAB — PHOSPHORUS: Phosphorus: 3.3 mg/dL (ref 2.5–4.6)

## 2024-01-18 MED ORDER — TORSEMIDE 20 MG PO TABS
40.0000 mg | ORAL_TABLET | Freq: Every day | ORAL | Status: DC
  Administered 2024-01-19 – 2024-01-20 (×2): 40 mg via ORAL
  Filled 2024-01-18 (×2): qty 2

## 2024-01-18 MED ORDER — POTASSIUM CHLORIDE CRYS ER 20 MEQ PO TBCR
40.0000 meq | EXTENDED_RELEASE_TABLET | ORAL | Status: AC
  Administered 2024-01-18 (×2): 40 meq via ORAL
  Filled 2024-01-18 (×2): qty 2

## 2024-01-18 MED ORDER — POTASSIUM CHLORIDE CRYS ER 20 MEQ PO TBCR: 40.0000 meq | EXTENDED_RELEASE_TABLET | Freq: Once | ORAL | Status: DC

## 2024-01-18 NOTE — Progress Notes (Signed)
 Mobility Specialist - Progress Note   01/18/24 1536  Mobility  Activity Transferred from chair to bed  Level of Assistance Minimal assist, patient does 75% or more  Assistive Device Front wheel walker  Distance Ambulated (ft) 4 ft  Activity Response Tolerated well  Mobility visit 1 Mobility  Mobility Specialist Start Time (ACUTE ONLY) 1527  Mobility Specialist Stop Time (ACUTE ONLY) 1533  Mobility Specialist Time Calculation (min) (ACUTE ONLY) 6 min   Pt sitting in the recliner upon entry, utilizing RA. Pt STS to RW ModA, reporting bilat LE numbness. Pt transferred to bed via SPT MinA, left supine with alarm set and needs within reach.  Zetta Bills Mobility Specialist 01/18/24 3:49 PM

## 2024-01-18 NOTE — Progress Notes (Signed)
 Heart Failure Stewardship Pharmacy Note  PCP: Corky Downs, MD PCP-Cardiologist: None  HPI: Mark Blevins is a 83 y.o. male with PAF on Eliquis, HTN, chronic HFpEF, moderate aortic stenosis, CKD stage II who presented with worsening LEE and dyspnea on exertion over the last 6 months. On admission, BNP was 935.6, HS-troponin was 34 > 32, and TSH 2.858. Chest x-ray noted increased mild cardiomegaly with central vascular congestion, mild basilar interstitial edema and small pleural effusions consistent with CHF. [place] with minimal ascites.  Pertinent cardiac history: Echo in 05/2021 with normal EF and moderate AS. Negative stress test in 05/2021. Echo this admission with LVEF down to 45-50% with grade I diastolic dysfunction, and severe AS.   Pertinent Lab Values: Creat  Date Value Ref Range Status  08/19/2021 1.35 (H) 0.70 - 1.22 mg/dL Final   Creatinine, Ser  Date Value Ref Range Status  01/18/2024 1.26 (H) 0.61 - 1.24 mg/dL Final   BUN  Date Value Ref Range Status  01/18/2024 16 8 - 23 mg/dL Final   Potassium  Date Value Ref Range Status  01/18/2024 3.0 (L) 3.5 - 5.1 mmol/L Final   Sodium  Date Value Ref Range Status  01/18/2024 133 (L) 135 - 145 mmol/L Final   B Natriuretic Peptide  Date Value Ref Range Status  01/14/2024 935.6 (H) 0.0 - 100.0 pg/mL Final    Comment:    Performed at [facility], [address] [place], [place] [address]   Magnesium  Date Value Ref Range Status  01/18/2024 1.9 1.7 - 2.4 mg/dL Final    Comment:    Performed at [facility], [address] [place], [place] [address]   TSH  Date Value Ref Range Status  01/14/2024 2.858 0.350 - 4.500 uIU/mL Final    Comment:    Performed by a 3rd Generation assay with a functional sensitivity of <=0.01 uIU/mL. Performed at [facility], [address] [place], [place] [address]     Vital Signs: Admission weight: 251.55 lbs Temp:  [98 F (36.7 C)-98.6 F (37 C)] 98 F  (36.7 C) (03/10 0727) Pulse Rate:  [69-75] 72 (03/10 0727) Cardiac Rhythm: Atrial fibrillation (03/09 1912) Resp:  [18-20] 18 (03/10 0727) BP: (89-109)/(54-75) 101/75 (03/10 0727) SpO2:  [95 %-98 %] 98 % (03/10 0727) Weight:  [109.5 kg (241 lb 6.5 oz)] 109.5 kg (241 lb 6.5 oz) (03/10 0500)  Intake/Output Summary (Last 24 hours) at 01/18/2024 2956 Last data filed at 01/18/2024 2130 Gross per 24 hour  Intake 887.29 ml  Output 4400 ml  Net -3512.71 ml    Current Heart Failure Medications:  Loop diuretic: none Beta-Blocker: carvedilol 3.125 mg BID ACEI/ARB/ARNI: MRA: spironolactone 12.5 mg daily SGLT2i: Other:  Prior to admission Heart Failure Medications:  Loop diuretic: furosemide 20 mg daily Beta-Blocker: labetalol 200 mg daily ACEI/ARB/ARNI: none MRA: none SGLT2i: none Other: amlodipine 5 mg daily, clonidine 0.1 mg daily  Assessment: 1. Acute combined systolic and diastolic heart failure (LVEF 45-50%) with grade I diastolic dysfunction and severe AS, due to NICM. NYHA class III-IV symptoms.  -Symptoms: Reports fatigue and LEE. Appetite is moderate.  -Volume: Has had excellent urine output this far. Creatinine improved with diuresis and bumped yesterday, so furosemide held today. Still appears to have excess fluid, especially in the lower extremities and abdomen. Would likely do fine on IV furosemide but could also consider furosemide 40 mg PO daily if concerned about creatinine. -Hemodynamics: BP soft. HR 60-70s.  -BB: Currently on  carvedilol 3.125 mg BID. Agree with replacing PTA labetalol.  -ACEI/ARB/ARNI: Can consider starting ARB when BP is more stable. This can help with medical management of AS. -MRA: Continue new start spironolactone 12.5 mg daily. -SGLT2i: Not a candidate at this time due to urethral catheter. -Will need to stop clonidine, labetalol, and amlodipine at discharge.  Plan: 1) Medication changes recommended at this time: -Consider resuming furosemide  today given persistent LEE and abdominal reservoir. May benefit from TED hose and abdominal binder.Marland Kitchen   2) Patient assistance: -Pending  3) Education: - Patient has been educated on current HF medications and potential additions to HF medication regimen - Patient verbalizes understanding that over the next few months, these medication doses may change and more medications may be added to optimize HF regimen - Patient has been educated on basic disease state pathophysiology and goals of therapy  Medication Assistance / Insurance Benefits Check: Does the patient have prescription insurance?    Type of insurance plan:  Does the patient qualify for medication assistance through manufacturers or grants? Pending   Outpatient Pharmacy: Prior to admission outpatient pharmacy: [facility]      Please do not hesitate to reach out with questions or concerns,  Enos Fling, PharmD, CPP, BCPS Heart Failure Pharmacist  Phone - [phone] 01/18/2024 11:37 AM

## 2024-01-18 NOTE — Progress Notes (Addendum)
 PHARMACY CONSULT NOTE - ELECTROLYTES  Pharmacy Consult for Electrolyte Monitoring and Replacement   Recent Labs: Height: 6' (182.9 cm) Weight: 109.5 kg (241 lb 6.5 oz) IBW/kg (Calculated) : 77.6 Estimated Creatinine Clearance: 56.8 mL/min (A) (by C-G formula based on SCr of 1.26 mg/dL (H)). Potassium (mmol/L)  Date Value  01/18/2024 3.0 (L)   Magnesium (mg/dL)  Date Value  16/08/9603 1.9   Calcium (mg/dL)  Date Value  54/07/8118 7.6 (L)   Albumin (g/dL)  Date Value  14/78/2956 3.2 (L)   Phosphorus (mg/dL)  Date Value  21/30/8657 3.3   Sodium (mmol/L)  Date Value  01/18/2024 133 (L)   Corrected Ca: 8.7 mg/dL  Assessment  Mark Blevins is a 83 y.o. male presenting with acute on chronic HFpEF decompensation. PMH significant for PAF on Eliquis, HTN, chronic HFpEF, moderate aortic stenosis, CKD stage II. Pharmacy has been consulted to monitor and replace electrolytes.  Diet: low sodium(<2g daily) with thin fluids only(fluid restriction<2L) MIVF: None Pertinent medications: Furosemide 40 mg IV bid, Spironolactone 12.5 mg daily   Goal of Therapy: K>4 and Mag>2 given history of Afib  Plan:  K 3.0, Will replace with Kcl po q4hr x 2 doses Continue spironolactone 12.5 mg daily Check renal panel with AM labs  Thank you for allowing pharmacy to be a part of this patient's care.  Alaya Iverson Rodriguez-Guzman PharmD, BCPS 01/18/2024 7:18 AM

## 2024-01-18 NOTE — Evaluation (Signed)
 Physical Therapy Evaluation Patient Details Name: Mark Blevins MRN: [id number] DOB: 01/12/1941 Today's Date: 01/18/2024  History of Present Illness  Pt is an 83 yo male that presented to the ED for LLE edema, dyspnea. PMH of PAF on Eliquis, HTN, chronic HFpEF, moderate aortic stenosis, CKD stage II.  Clinical Impression  Pt alert, agreeable to mobility with encouragement, often self limiting. Per pt at baseline prior to this hospital admission he was not using AD, and was independent in ADLs, wife performed IADLs. Did stated right before hospital admission was trying to use a RW, denies any recent falls.  The patient required extra time during all mobility tasks to maximize his participation and effort. CGA with reliance on bed rails and HOB elevated to transition to EOB. Sit <> stand from bed and from recliner, minA with RW and max encouragement. He was able to step pivot with minA for steadying but unable to tolerate true ambulation today due to fatigue and reported bilateral feet numbness(swelling noted in both feet).  Overall the patient demonstrated deficits (see "PT Problem List") that impede the patient's functional abilities, safety, and mobility and would benefit from skilled PT intervention.          If plan is discharge home, recommend the following: A lot of help with bathing/dressing/bathroom;A lot of help with walking and/or transfers;Help with stairs or ramp for entrance;Assist for transportation;Assistance with cooking/housework   Can travel by private vehicle   No    Equipment Recommendations None recommended by PT (has RW and quad cane at home)  Recommendations for Other Services       Functional Status Assessment Patient has had a recent decline in their functional status and demonstrates the ability to make significant improvements in function in a reasonable and predictable amount of time.     Precautions / Restrictions Precautions Precautions: Fall Recall of  Precautions/Restrictions: Intact Restrictions Weight Bearing Restrictions Per Provider Order: No      Mobility  Bed Mobility Overal bed mobility: Needs Assistance Bed Mobility: Supine to Sit     Supine to sit: Contact guard, HOB elevated, Used rails     General bed mobility comments: reliant on bed rails, extra time    Transfers Overall transfer level: Needs assistance Equipment used: Rolling walker (2 wheels) Transfers: Sit to/from Stand, Bed to chair/wheelchair/BSC Sit to Stand: Min assist   Step pivot transfers: Min assist            Ambulation/Gait               General Gait Details: unable to tolerate due to feet numbness, fatigue  Stairs            Wheelchair Mobility     Tilt Bed    Modified Rankin (Stroke Patients Only)       Balance Overall balance assessment: Needs assistance Sitting-balance support: Feet supported Sitting balance-Leahy Scale: Fair     Standing balance support: Bilateral upper extremity supported Standing balance-Leahy Scale: Poor                               Pertinent Vitals/Pain Pain Assessment Pain Assessment: No/denies pain    Home Living Family/patient expects to be discharged to:: Private residence Living Arrangements: Spouse/significant other Available Help at Discharge: Family Type of Home: House Home Access: Stairs to enter   [employer] of Steps: 3, with safety bar   Home Layout: One level Home Equipment:  Rolling Walker (2 wheels);Cane - quad      Prior Function Prior Level of Function : Independent/Modified Independent             Mobility Comments: prior to hospitalization pt did not use RW or quad cane, but right before hospital he began to use AD ADLs Comments: reported modI for ADLs, wife performs IADLs     Extremity/Trunk Assessment   Upper Extremity Assessment Upper Extremity Assessment: Generalized weakness    Lower Extremity Assessment Lower  Extremity Assessment: Generalized weakness (able to lift BLe from recliner against gravity)       Communication   Communication Factors Affecting Communication: Hearing impaired    Cognition Arousal: Alert Behavior During Therapy: WFL for tasks assessed/performed   PT - Cognitive impairments: No apparent impairments                                 Cueing       General Comments      Exercises     Assessment/Plan    PT Assessment Patient needs continued PT services  PT Problem List Decreased strength;Decreased activity tolerance;Decreased balance;Decreased mobility;Decreased knowledge of precautions;Decreased range of motion       PT Treatment Interventions DME instruction;Balance training;Gait training;Neuromuscular re-education;Stair training;Functional mobility training;Patient/family education;Therapeutic activities;Therapeutic exercise    PT Goals (Current goals can be found in the Care Plan section)  Acute Rehab PT Goals Patient Stated Goal: to go home PT Goal Formulation: With patient Time For Goal Achievement: 02/01/24 Potential to Achieve Goals: Good    Frequency Min 2X/week     Co-evaluation               AM-PAC PT "6 Clicks" Mobility  Outcome Measure Help needed turning from your back to your side while in a flat bed without using bedrails?: A Lot Help needed moving from lying on your back to sitting on the side of a flat bed without using bedrails?: A Lot Help needed moving to and from a bed to a chair (including a wheelchair)?: A Little Help needed standing up from a chair using your arms (e.g., wheelchair or bedside chair)?: A Little Help needed to walk in hospital room?: A Lot Help needed climbing 3-5 steps with a railing? : Total 6 Click Score: 13    End of Session Equipment Utilized During Treatment: Gait belt Activity Tolerance: Patient tolerated treatment well;Patient limited by fatigue Patient left: in chair;with call  bell/phone within reach Nurse Communication: Mobility status PT Visit Diagnosis: Difficulty in walking, not elsewhere classified (R26.2);Muscle weakness (generalized) (M62.81);Other abnormalities of gait and mobility (R26.89)    Time: 4098-1191 PT Time Calculation (min) (ACUTE ONLY): 21 min   Charges:   PT Evaluation $PT Eval Low Complexity: 1 Low PT Treatments $Therapeutic Activity: 8-22 mins PT General Charges $$ ACUTE PT VISIT: 1 Visit         Olga Coaster PT, DPT 2:39 PM,01/18/24

## 2024-01-18 NOTE — Care Management Important Message (Signed)
 Important Message  Patient Details  Name: Mark Blevins MRN: [id number] Date of Birth: 06-23-1941   Important Message Given:  Yes - Medicare IM     Cristela Blue, CMA 01/18/2024, 12:31 PM

## 2024-01-18 NOTE — Progress Notes (Signed)
 Heart Failure Navigator Progress Note  Assessed for Heart & Vascular TOC clinic readiness.  Patient does not meet criteria due to current [facility] patient.   Navigator will sign off at this time.  Roxy Horseman, RN, BSN [facility] Heart Failure Navigator Secure Chat Only

## 2024-01-18 NOTE — Progress Notes (Signed)
 Patient ID: ALEE KATEN, male   DOB: 1941-07-21, 83 y.o.   MRN: [id number] [facility]    SUBJECTIVE: Resting comfortably slightly less anasarca shortness of breath and swelling has not been as ambulatory as he showed denies any chest pain no palpitations or tachycardia   Vitals:   01/18/24 0448 01/18/24 0500 01/18/24 0727 01/18/24 1105  BP: 99/65  101/75 108/66  Pulse: 70  72 67  Resp: 20  18 18   Temp: 98 F (36.7 C)  98 F (36.7 C) 98.1 F (36.7 C)  TempSrc:   Oral   SpO2: 95%  98% 99%  Weight:  109.5 kg    Height:         Intake/Output Summary (Last 24 hours) at 01/18/2024 1228 Last data filed at 01/18/2024 2952 Gross per 24 hour  Intake 887.29 ml  Output 2850 ml  Net -1962.71 ml      PHYSICAL EXAM  General: Well developed, well nourished, in no acute distress neurolyse anasarca HEENT:  Normocephalic and atramatic Neck:  No JVD.  Lungs: Clear bilaterally to auscultation and percussion. Heart: HRRR . Normal S1 and S2 without gallops or 2/6 sem murmurs.  Abdomen: Bowel sounds are positive, abdomen soft and non-tender edema Msk:  Back normal, normal gait. Normal strength and tone for age. Extremities: No clubbing, cyanosis or 3+edema.   Neuro: Alert and oriented X 3. Psych:  Good affect, responds appropriately   LABS: Basic Metabolic Panel: Recent Labs    01/17/24 0459 01/18/24 0434  NA 134* 133*  K 3.2* 3.0*  CL 95* 93*  CO2 30 30  GLUCOSE 128* 116*  BUN 15 16  CREATININE 1.15 1.26*  CALCIUM 7.6* 7.6*  MG 1.7 1.9  PHOS 2.3* 3.3   Liver Function Tests: No results for input(s): "AST", "ALT", "ALKPHOS", "BILITOT", "PROT", "ALBUMIN" in the last 72 hours. No results for input(s): "LIPASE", "AMYLASE" in the last 72 hours. CBC: No results for input(s): "WBC", "NEUTROABS", "HGB", "HCT", "MCV", "PLT" in the last 72 hours. Cardiac Enzymes: No results for input(s): "CKTOTAL", "CKMB", "CKMBINDEX", "TROPONINI" in the last 72 hours. BNP: Invalid input(s):  "POCBNP" D-Dimer: No results for input(s): "DDIMER" in the last 72 hours. Hemoglobin A1C: No results for input(s): "HGBA1C" in the last 72 hours. Fasting Lipid Panel: No results for input(s): "CHOL", "HDL", "LDLCALC", "TRIG", "CHOLHDL", "LDLDIRECT" in the last 72 hours. Thyroid Function Tests: No results for input(s): "TSH", "T4TOTAL", "T3FREE", "THYROIDAB" in the last 72 hours.  Invalid input(s): "FREET3" Anemia Panel: No results for input(s): "VITAMINB12", "FOLATE", "FERRITIN", "TIBC", "IRON", "RETICCTPCT" in the last 72 hours.  No results found.   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  TELEMETRY: Atrial fibrillation rate of 82:  ASSESSMENT AND PLAN:  Principal Problem:   Acute on chronic combined systolic and diastolic CHF (congestive heart failure) (HCC) Active Problems:   Essential hypertension   Paroxysmal atrial fibrillation (HCC)   Aortic stenosis   BPH (benign prostatic hyperplasia)   CKD (chronic kidney disease), stage II   Acute urinary retention   Hypokalemia    Plan Acute on chronic systolic congestive heart failure with anasarca continue diuretic therapy - d/c on torsemide 40 mg Severe aortic stenosis valve area 0.6 cm consider TAVR once patient is optimized probably a poor SAVR candidate Chronic renal sufficiency stage II continue diuretic therapy Paroxysmal atrial fibrillation anticoagulation therapy rate control Obesity recommend weight loss exercise portion control Hypokalemia continue to replenish correct  electrolytes Consider referral to heart failure clinic Patient stable for d/c from cardiac standpoint   Clotilde Dieter, DO 01/18/2024 12:28 PM

## 2024-01-18 NOTE — Progress Notes (Signed)
 Progress Note   Patient: Mark Blevins ZOX:[id number] DOB: 1941-04-07 DOA: 01/14/2024     4 DOS: the patient was seen and examined on 01/18/2024   Brief hospital course: Taken from H&P.  Mark Blevins is a 83 y.o. male with medical history significant of PAF on Eliquis, HTN, chronic HFpEF, moderate aortic stenosis, CKD stage II presented with worsening of leg swelling and exertional dyspnea, over the.  Of last 58-month, gradually worsening.  Recently noticed significantly worsening of swelling and abdominal girth.  On presentation hemodynamically stable, on room air.  Chest x-ray with mild pulmonary congestion.  Labs with potassium of 3, BUN 19, creatinine 1.5 with baseline of 1.2-1.3.  BUN 935, troponin 34>>32  Patient was admitted for IV diuresis.  Echocardiogram ordered.  Cardiology was consulted.  3/7: Vital stable, ultrasound abdomen with minimal ascites, mild right pleural effusion and soft tissue edema.  Labs with potassium of 2.7, magnesium of 1.8, TSH normal.  Creatinine with some improvement to 1.46.  Echocardiogram with EF of 45 to 50%, regional wall motion abnormalities and grade 1 diastolic dysfunction.  Calcified aortic valve with severe aortic stenosis.   3/8: Hemodynamically stable, good UOP but weight recorded is slight increase than before which does not make sense.  Significant hypokalemia and mild hypomagnesemia which are being repleted.  Per cardiology he might be a poor candidate for TAVR or SAVR but they will evaluate as outpatient.  Continue IV diuresis and replating electrolytes.  3/9: Hemodynamically stable, continuing diuresis with slowly improving creatinine.  Mild hypokalemia, hypophosphatemia and hypomagnesemia which is being repleted.  3/10: Remained hemodynamically stable, good urinary output with net negative of more than 20 L.  Slight increase in creatinine so IV Lasix is being held, we can start him on p.o. torsemide 40 mg from tomorrow. PT is recommending  SNF-now medically stable  Assessment and Plan: * Acute on chronic combined systolic and diastolic CHF (congestive heart failure) (HCC) Echocardiogram with borderline low EF at 45 to 50%, grade 1 diastolic dysfunction and severe aortic stenosis. Prior echocardiogram done in 2022 with normal EF and moderate AS.  Elevated BNP at 935. Exertional dyspnea seems like a combination of CHF and aortic stenosis.  Patient diuresed very well with net negative of more than 20 L, slight increase in creatinine today -Holding further IV Lasix -Will start on torsemide from tomorrow -Daily weight and BMP -Strict intake and output -Cardiology is on board-appreciate their help  Aortic stenosis Severe aortic stenosis per echocardiogram done yesterday. Likely contributing to his symptoms of dyspnea. -Cardiology is on board -Might not be a good candidate for TAVR/SAVR but they will reevaluate as outpatient  Hypokalemia Mild hypokalemia which is being repleted. -Replete electrolytes and monitor  Acute urinary retention Patient has an history of BPH and was not taking any medication currently. Foley catheter was placed in ED.  BPH (benign prostatic hyperplasia) -Starting on Flomax  Essential hypertension Blood pressure currently within goal. Home amlodipine and clonidine is being held due to worsening of CHF. Home labetalol was switched with Coreg Patient is also being diuresed with IV Lasix Continue spironolactone  Paroxysmal atrial fibrillation (HCC) Currently in sinus rhythm. Home labetalol has been switched with Coreg Continue with Eliquis  CKD (chronic kidney disease), stage II Slight increase in creatinine with some improvement today, not meeting the criteria for AKI -Monitor renal function -Avoid nephrotoxins  Subjective: Patient was seen and examined today.  No new concern.  Physical Exam: Vitals:   01/18/24 0448 01/18/24  0500 01/18/24 0727 01/18/24 1105  BP: 99/65  101/75 108/66   Pulse: 70  72 67  Resp: 20  18 18   Temp: 98 F (36.7 C)  98 F (36.7 C) 98.1 F (36.7 C)  TempSrc:   Oral   SpO2: 95%  98% 99%  Weight:  109.5 kg    Height:       General.  Frail elderly man, in no acute distress. Pulmonary.  Lungs clear bilaterally, normal respiratory effort. CV.  Regular rate and rhythm, no JVD, rub or murmur. Abdomen.  Soft, nontender, nondistended, BS positive. CNS.  Alert and oriented .  No focal neurologic deficit. Extremities.  Trace edema, no cyanosis, pulses intact and symmetrical.  Data Reviewed: Prior data reviewed  Family Communication:   Disposition: Status is: Inpatient Remains inpatient appropriate because: Severity of illness  Planned Discharge Destination: Home  DVT Prophylaxis. Eliquis Time spent: 44 minutes  This record has been created using Conservation officer, historic buildings. Errors have been sought and corrected,but may not always be located. Such creation errors do not reflect on the standard of care.   Author: Arnetha Courser, MD 01/18/2024 2:43 PM  For on call review [email or web address].

## 2024-01-19 DIAGNOSIS — E876 Hypokalemia: Secondary | ICD-10-CM | POA: Diagnosis not present

## 2024-01-19 DIAGNOSIS — I5043 Acute on chronic combined systolic (congestive) and diastolic (congestive) heart failure: Secondary | ICD-10-CM | POA: Diagnosis not present

## 2024-01-19 DIAGNOSIS — I350 Nonrheumatic aortic (valve) stenosis: Secondary | ICD-10-CM | POA: Diagnosis not present

## 2024-01-19 DIAGNOSIS — R338 Other retention of urine: Secondary | ICD-10-CM | POA: Diagnosis not present

## 2024-01-19 LAB — RENAL FUNCTION PANEL
Albumin: 2.6 g/dL — ABNORMAL LOW (ref 3.5–5.0)
Anion gap: 10 (ref 5–15)
BUN: 17 mg/dL (ref 8–23)
CO2: 28 mmol/L (ref 22–32)
Calcium: 8.1 mg/dL — ABNORMAL LOW (ref 8.9–10.3)
Chloride: 93 mmol/L — ABNORMAL LOW (ref 98–111)
Creatinine, Ser: 1.18 mg/dL (ref 0.61–1.24)
GFR, Estimated: >60 mL/min (ref >60)
Glucose, Bld: 112 mg/dL — ABNORMAL HIGH (ref 70–99)
Phosphorus: 3.4 mg/dL (ref 2.5–4.6)
Potassium: 3.1 mmol/L — ABNORMAL LOW (ref 3.5–5.1)
Sodium: 131 mmol/L — ABNORMAL LOW (ref 135–145)

## 2024-01-19 MED ORDER — SPIRONOLACTONE 25 MG PO TABS
25.0000 mg | ORAL_TABLET | Freq: Every day | ORAL | Status: DC
  Administered 2024-01-20 – 2024-01-21 (×2): 25 mg via ORAL
  Filled 2024-01-19 (×3): qty 1

## 2024-01-19 MED ORDER — POTASSIUM CHLORIDE CRYS ER 20 MEQ PO TBCR
40.0000 meq | EXTENDED_RELEASE_TABLET | Freq: Three times a day (TID) | ORAL | Status: AC
  Administered 2024-01-19 (×3): 40 meq via ORAL
  Filled 2024-01-19 (×3): qty 2

## 2024-01-19 NOTE — Progress Notes (Signed)
 [facility] CLINIC CARDIOLOGY PROGRESS NOTE       Patient ID: Mark Blevins MRN: [id number] DOB/AGE: 1941-05-25 83 y.o.  Admit date: 01/14/2024 Referring Physician Dr. Mikey College Primary Physician Corky Downs, MD  Primary Cardiologist Minda Ditto, [place] (last seen 2023) Reason for Consultation AoCHF  HPI: Mark Blevins is a 83 y.o. male  with a past medical history of moderate aortic valve stenosis, hypertension, persistent atrial fibrillation who presented to the ED on 01/14/2024 for shortness of breath. BNP elevated at 935. Cardiology was consulted for further evaluation.   Interval history: -Patient seen and examined this morning.  Resting comfortably in hospital bed. -States that he feels about the same as he has for the last few days.   -Denies any significant shortness of breath, legs feel about the same but swelling appears improved.  Review of systems complete and found to be negative unless listed above    Past Medical History:  Diagnosis Date   Aortic valve stenosis    moderate (TTE 2022)   Atrial fibrillation (HCC)    BPH (benign prostatic hyperplasia)    Hypertension    Poor circulation of extremity    left leg   Venous reflux    Wears dentures    Full upper and lower    Past Surgical History:  Procedure Laterality Date   APPENDECTOMY     BACK SURGERY     CATARACT EXTRACTION W/PHACO Left 02/05/2022   Procedure: CATARACT EXTRACTION PHACO AND INTRAOCULAR LENS PLACEMENT (IOC) LEFT MALYUGIN 19.75 01:59.8;  Surgeon: Lockie Mola, MD;  Location: [facility];  Service: Ophthalmology;  Laterality: Left;   CATARACT EXTRACTION W/PHACO Right 02/19/2022   Procedure: CATARACT EXTRACTION PHACO AND INTRAOCULAR LENS PLACEMENT (IOC) RIGHT MALYUGIN 1900 01:55.6;  Surgeon: Lockie Mola, MD;  Location: [facility];  Service: Ophthalmology;  Laterality: Right;   COLECTOMY N/A 04/27/2019   Procedure: COLECTOMY WITH COLOSTOMY;  Surgeon: Sung Amabile, DO;   Location: [facility] ORS;  Service: General;  Laterality: N/A;   COLONOSCOPY WITH PROPOFOL N/A 04/24/2019   Procedure: COLONOSCOPY WITH PROPOFOL;  Surgeon: Sung Amabile, DO;  Location: [facility] ENDOSCOPY;  Service: General;  Laterality: N/A;   LAPAROSCOPIC LYSIS OF ADHESIONS  05/06/2019   Procedure: LAPAROSCOPIC LYSIS OF ADHESIONS;  Surgeon: Sung Amabile, DO;  Location: [facility] ORS;  Service: General;;   LAPAROSCOPY N/A 05/06/2019   Procedure: LAPAROSCOPY DIAGNOSTIC;  Surgeon: Sung Amabile, DO;  Location: [facility] ORS;  Service: General;  Laterality: N/A;   REPLACEMENT TOTAL KNEE BILATERAL     XI ROBOTIC ASSISTED COLOSTOMY TAKEDOWN N/A 02/03/2020   Procedure: XI ROBOTIC ASSISTED COLOSTOMY TAKEDOWN;  Surgeon: Sung Amabile, DO;  Location: [facility] ORS;  Service: General;  Laterality: N/A;    Medications Prior to Admission  Medication Sig Dispense Refill Last Dose/Taking   ALPRAZolam (XANAX) 0.5 MG tablet Take 0.5 mg by mouth once as needed for anxiety (for procedures).   Taking As Needed   amLODipine (NORVASC) 5 MG tablet TAKE 1 TABLET(5 MG) BY MOUTH DAILY 90 tablet 3 01/13/2024   apixaban (ELIQUIS) 5 MG TABS tablet Take 1 tablet (5 mg total) by mouth 2 (two) times daily. 180 tablet 3 01/14/2024 Morning   cloNIDine (CATAPRES) 0.1 MG tablet Take 1 tablet (0.1 mg total) by mouth daily. 90 tablet 2 01/13/2024   furosemide (LASIX) 20 MG tablet Take 20 mg by mouth daily.   Past Week   labetalol (NORMODYNE) 200 MG tablet Take 1 tablet (200 mg total) by mouth daily. 90 tablet  3 01/13/2024   Multiple Vitamin (MULTIVITAMIN WITH MINERALS) TABS tablet Take 1 tablet by mouth daily.   01/13/2024   potassium chloride (KLOR-CON) 10 MEQ tablet Take 10 mEq by mouth daily.   01/13/2024   silver sulfADIAZINE (SILVADENE) 1 % cream Apply 1 Application topically daily. (Patient not taking: Reported on 01/07/2024)   Not Taking   Social History   Socioeconomic History   Marital status: Married    Spouse name: Not on file   Number of children: Not on  file   Years of education: Not on file   Highest education level: Not on file  Occupational History   Not on file  Tobacco Use   Smoking status: Former    Current packs/day: 0.00    Types: Cigarettes    Quit date: 1971    Years since quitting: 54.2   Smokeless tobacco: Current    Types: Chew   Tobacco comments:    quit smoking 1969 or 1970  Vaping Use   Vaping status: Never Used  Substance and Sexual Activity   Alcohol use: Yes    Alcohol/week: 24.0 standard drinks of alcohol    Types: 24 Cans of beer per week    Comment: 3-4 per day   Drug use: Never   Sexual activity: Yes  Other Topics Concern   Not on file  Social History Narrative   Not on file   Social Drivers of Health   Financial Resource Strain: Low Risk  (08/22/2021)   Overall Financial Resource Strain (CARDIA)    Difficulty of Paying Living Expenses: Not hard at all  Food Insecurity: No Food Insecurity (01/15/2024)   Hunger Vital Sign    Worried About Running Out of Food in the Last Year: Never true    Ran Out of Food in the Last Year: Never true  Transportation Needs: No Transportation Needs (01/15/2024)   PRAPARE - Administrator, Civil Service (Medical): No    Lack of Transportation (Non-Medical): No  Physical Activity: Insufficiently Active (08/22/2021)   Exercise Vital Sign    Days of Exercise per Week: 2 days    Minutes of Exercise per Session: 20 min  Stress: No Stress Concern Present (08/22/2021)   [employer] of Occupational Health - Occupational Stress Questionnaire    Feeling of Stress : Not at all  Social Connections: Socially Integrated (01/15/2024)   Social Connection and Isolation Panel [NHANES]    Frequency of Communication with Friends and Family: More than three times a week    Frequency of Social Gatherings with Friends and Family: More than three times a week    Attends Religious Services: More than 4 times per year    Active Member of [employer] or Organizations: Yes     Attends Banker Meetings: More than 4 times per year    Marital Status: Married  Catering manager Violence: Not At Risk (01/15/2024)   Humiliation, Afraid, Rape, and Kick questionnaire    Fear of Current or Ex-Partner: No    Emotionally Abused: No    Physically Abused: No    Sexually Abused: No    History reviewed. No pertinent family history.   Vitals:   01/19/24 0453 01/19/24 0715 01/19/24 1001 01/19/24 1110  BP:  126/89 104/73 113/66  Pulse:  72 76 67  Resp:  20  20  Temp:  97.8 F (36.6 C)  97.8 F (36.6 C)  TempSrc:    Oral  SpO2:  96% 99% 98%  Weight: 109.3 kg     Height:        PHYSICAL EXAM General: Chronically ill-appearing elderly male, well nourished, in no acute distress. HEENT: Normocephalic and atraumatic. Neck: No JVD.  Lungs: Normal respiratory effort on room air.  Bibasilar crackles Heart: Irregularly irregular, controlled rate. Normal S1 and S2 without gallops or murmurs.  Abdomen: Non-distended appearing.  Msk: Normal strength and tone for age. Extremities: Warm and well perfused. No clubbing, cyanosis. 1+ pitting edema.  Neuro: Alert and oriented X 3. Psych: Answers questions appropriately.   Labs: Basic Metabolic Panel: Recent Labs    01/17/24 0459 01/18/24 0434 01/19/24 0429  NA 134* 133* 131*  K 3.2* 3.0* 3.1*  CL 95* 93* 93*  CO2 30 30 28   GLUCOSE 128* 116* 112*  BUN 15 16 17   CREATININE 1.15 1.26* 1.18  CALCIUM 7.6* 7.6* 8.1*  MG 1.7 1.9  --   PHOS 2.3* 3.3 3.4   Liver Function Tests: Recent Labs    01/19/24 0429  ALBUMIN 2.6*   No results for input(s): "LIPASE", "AMYLASE" in the last 72 hours. CBC: No results for input(s): "WBC", "NEUTROABS", "HGB", "HCT", "MCV", "PLT" in the last 72 hours.  Cardiac Enzymes: No results for input(s): "CKTOTAL", "CKMB", "CKMBINDEX", "TROPONINIHS" in the last 72 hours.  BNP: No results for input(s): "BNP" in the last 72 hours.  D-Dimer: No results for input(s): "DDIMER" in the  last 72 hours. Hemoglobin A1C: No results for input(s): "HGBA1C" in the last 72 hours. Fasting Lipid Panel: No results for input(s): "CHOL", "HDL", "LDLCALC", "TRIG", "CHOLHDL", "LDLDIRECT" in the last 72 hours. Thyroid Function Tests: No results for input(s): "TSH", "T4TOTAL", "T3FREE", "THYROIDAB" in the last 72 hours.  Invalid input(s): "FREET3" Anemia Panel: No results for input(s): "VITAMINB12", "FOLATE", "FERRITIN", "TIBC", "IRON", "RETICCTPCT" in the last 72 hours.   Radiology: DG Chest 1 View Result Date: 01/15/2024 CLINICAL DATA:  83 year old male with history of congestive heart failure. EXAM: CHEST  1 VIEW COMPARISON:  Chest x-ray 01/14/2024. FINDINGS: Lung volumes are low. No confluent consolidative airspace disease. No pleural effusions. No pneumothorax. No evidence of pulmonary edema. Heart size is mildly enlarged. The patient is rotated to the right on today's exam, resulting in distortion of the mediastinal contours and reduced diagnostic sensitivity and specificity for mediastinal pathology. IMPRESSION: 1. Low lung volumes without radiographic evidence of acute cardiopulmonary disease. 2. Mild cardiomegaly. Electronically Signed   By: Trudie Reed M.D.   On: 01/15/2024 07:22   ECHOCARDIOGRAM COMPLETE Result Date: 01/14/2024    ECHOCARDIOGRAM REPORT   Patient Name:   Mark Blevins Date of Exam: 01/14/2024 Medical Rec #:  [id number]     Height:       72.0 in Accession #:    [id number]    Weight:       250.0 lb Date of Birth:  June 04, 1941     BSA:          2.343 m Patient Age:    82 years      BP:           146/69 mmHg Patient Gender: M             HR:           68 bpm. Exam Location:  [facility] Procedure: 2D Echo, Cardiac Doppler and Color Doppler (Both Spectral and Color            Flow Doppler were utilized during procedure). Indications:     CHF-acute diastolic  I50.31  History:         Patient has no prior history of Echocardiogram examinations.                  Arrythmias:Atrial  Fibrillation; Risk Factors:Hypertension.  Sonographer:     Cristela Blue Referring Phys:  [id number] Dailyn Reith Diagnosing Phys: Alwyn Pea MD IMPRESSIONS  1. Left ventricular ejection fraction, by estimation, is 45 to 50%. The left ventricle has normal function. The left ventricle demonstrates regional wall motion abnormalities (see scoring diagram/findings for description). There is moderate concentric left ventricular hypertrophy. Left ventricular diastolic parameters are consistent with Grade I diastolic dysfunction (impaired relaxation).  2. Right ventricular systolic function is normal. The right ventricular size is normal.  3. The mitral valve is normal in structure. No evidence of mitral valve regurgitation.  4. The aortic valve is calcified. Aortic valve regurgitation is mild. Severe aortic valve stenosis. FINDINGS  Left Ventricle: Ant/apical/setal hypo. Left ventricular ejection fraction, by estimation, is 45 to 50%. The left ventricle has normal function. The left ventricle demonstrates regional wall motion abnormalities. Strain was performed and the global longitudinal strain is indeterminate. The left ventricular internal cavity size was normal in size. There is moderate concentric left ventricular hypertrophy. Left ventricular diastolic parameters are consistent with Grade I diastolic dysfunction (impaired relaxation). Right Ventricle: The right ventricular size is normal. No increase in right ventricular wall thickness. Right ventricular systolic function is normal. Left Atrium: Left atrial size was normal in size. Right Atrium: Right atrial size was normal in size. Pericardium: There is no evidence of pericardial effusion. Mitral Valve: The mitral valve is normal in structure. No evidence of mitral valve regurgitation. MV peak gradient, 10.4 mmHg. The mean mitral valve gradient is 5.0 mmHg. Tricuspid Valve: The tricuspid valve is normal in structure. Tricuspid valve regurgitation is mild. Aortic  Valve: The aortic valve is calcified. Aortic valve regurgitation is mild. Severe aortic stenosis is present. Aortic valve mean gradient measures 29.8 mmHg. Aortic valve peak gradient measures 53.7 mmHg. Aortic valve area, by VTI measures 0.58 cm. Pulmonic Valve: The pulmonic valve was normal in structure. Pulmonic valve regurgitation is not visualized. Aorta: The ascending aorta was not well visualized. IAS/Shunts: No atrial level shunt detected by color flow Doppler. Additional Comments: 3D was performed not requiring image post processing on an independent workstation and was indeterminate.  LEFT VENTRICLE PLAX 2D LVIDd:         4.70 cm LVIDs:         3.30 cm LV PW:         1.70 cm LV IVS:        1.50 cm LVOT diam:     2.00 cm LV SV:         48 LV SV Index:   21 LVOT Area:     3.14 cm  RIGHT VENTRICLE RV Basal diam:  4.50 cm RV Mid diam:    3.50 cm RV S prime:     9.03 cm/s TAPSE (M-mode): 2.2 cm LEFT ATRIUM              Index        RIGHT ATRIUM           Index LA diam:        3.40 cm  1.45 cm/m   RA Area:     23.60 cm LA Vol (A2C):   104.0 ml 44.40 ml/m  RA Volume:   72.00 ml  30.74 ml/m LA  Vol (A4C):   134.0 ml 57.20 ml/m LA Biplane Vol: 121.0 ml 51.65 ml/m  AORTIC VALVE AV Area (Vmax):    0.51 cm AV Area (Vmean):   0.52 cm AV Area (VTI):     0.58 cm AV Vmax:           366.25 cm/s AV Vmean:          252.000 cm/s AV VTI:            0.829 m AV Peak Grad:      53.7 mmHg AV Mean Grad:      29.8 mmHg LVOT Vmax:         59.10 cm/s LVOT Vmean:        41.800 cm/s LVOT VTI:          0.153 m LVOT/AV VTI ratio: 0.18  AORTA Ao Root diam: 3.30 cm MITRAL VALVE                TRICUSPID VALVE MV Area (PHT): 5.58 cm     TR Peak grad:   44.1 mmHg MV Area VTI:   1.55 cm     TR Vmax:        332.00 cm/s MV Peak grad:  10.4 mmHg MV Mean grad:  5.0 mmHg     SHUNTS MV Vmax:       1.61 m/s     Systemic VTI:  0.15 m MV Vmean:      99.2 cm/s    Systemic Diam: 2.00 cm MV Decel Time: 136 msec MV E velocity: 156.00 cm/s Dwayne  Salome Arnt MD Electronically signed by Alwyn Pea MD Signature Date/Time: 01/14/2024/4:35:16 PM    Final    [place] ASCITES (ABDOMEN LIMITED) Result Date: 01/14/2024 CLINICAL DATA:  Ascites EXAM: LIMITED ABDOMEN ULTRASOUND FOR ASCITES TECHNIQUE: Limited ultrasound survey for ascites was performed in all four abdominal quadrants. COMPARISON:  Abdomen pelvis CT 09/05/2021 FINDINGS: Minimal ascites identified in the 4 quadrants. Fatty liver infiltration. Note is made of a right pleural effusion. Edematous soft tissues. IMPRESSION: Minimal ascites suggested. Soft tissue edema. Right pleural effusion Electronically Signed   By: Karen Kays M.D.   On: 01/14/2024 14:09   DG Chest 2 View Result Date: 01/14/2024 CLINICAL DATA:  Shortness of breath. EXAM: CHEST - 2 VIEW COMPARISON:  PA Lat chest 01/09/2020 FINDINGS: There is increased mild cardiomegaly. Interval new central vascular congestion, with mild basilar interstitial edema is noted and small pleural effusions are forming. No focal airspace disease is seen. The mediastinum is normally outlined. There is calcification of the transverse aorta. No new osseous findings. Multilevel thoracic spine bridging enthesopathy and slight dextroscoliosis are again shown. IMPRESSION: 1. Increased mild cardiomegaly with central vascular congestion, mild basilar interstitial edema and small pleural effusions. Findings are consistent with CHF. 2. Aortic atherosclerosis. Electronically Signed   By: Almira Bar M.D.   On: 01/14/2024 07:39    ECHO as above  TELEMETRY reviewed by me 01/19/2024: Not on telemetry  EKG reviewed by me: atrial fibrillation PVCs rate 77 bpm  Data reviewed by me 01/19/2024: last 24h vitals tele labs imaging I/O ED provider note, admission H&P  Principal Problem:   Acute on chronic combined systolic and diastolic CHF (congestive heart failure) (HCC) Active Problems:   Essential hypertension   Paroxysmal atrial fibrillation (HCC)   Aortic  stenosis   BPH (benign prostatic hyperplasia)   CKD (chronic kidney disease), stage II   Acute urinary retention   Hypokalemia  ASSESSMENT AND PLAN:  Mark Blevins is a 83 y.o. male  with a past medical history of moderate aortic valve stenosis, hypertension, persistent atrial fibrillation who presented to the ED on 01/14/2024 for shortness of breath. BNP elevated at 935. Cardiology was consulted for further evaluation.   # Acute on chronic HFpEF # Severe aortic stenosis # Chronic atrial fibrillation Patient with hx of HFpEF and moderate AS on last echo 05/2021. Now presenting with SOB, anasarca. BNP elevated at 935. Net negative 21.5L. Echo with EF 45-50%, moderate LVH, severe AS.  -Continue torsemide 40 mg daily.  -Increase spironolactone to 25 mg daily.  -Continue carvedilol 3.125 mg twice daily for rate control and eliquis 5 mg twice daily for stroke risk reduction.  -Minimally elevated and flat troponin most consistent with demand/supply mismatch and not ACS    Patient stable for discharge from cardiac perspective on current medications. Pending possible SNF placement. [facility]ilogy will sign off. Plan to see in clinic in 1 week.   This patient's plan of care was discussed and created with Dr. Melton Alar and she is in agreement.  Signed: Gale Journey, PA-C  01/19/2024, 12:37 PM [facility]

## 2024-01-19 NOTE — Plan of Care (Signed)

## 2024-01-19 NOTE — Progress Notes (Signed)
 Progress Note   Patient: Mark Blevins:[id number] DOB: February 10, 1941 DOA: 01/14/2024     5 DOS: the patient was seen and examined on 01/19/2024   Brief hospital course: Taken from H&P.  Mark Blevins is a 83 y.o. male with medical history significant of PAF on Eliquis, HTN, chronic HFpEF, moderate aortic stenosis, CKD stage II presented with worsening of leg swelling and exertional dyspnea, over the.  Of last 70-month, gradually worsening.  Recently noticed significantly worsening of swelling and abdominal girth.  On presentation hemodynamically stable, on room air.  Chest x-ray with mild pulmonary congestion.  Labs with potassium of 3, BUN 19, creatinine 1.5 with baseline of 1.2-1.3.  BUN 935, troponin 34>>32  Patient was admitted for IV diuresis.  Echocardiogram ordered.  Cardiology was consulted.  3/7: Vital stable, ultrasound abdomen with minimal ascites, mild right pleural effusion and soft tissue edema.  Labs with potassium of 2.7, magnesium of 1.8, TSH normal.  Creatinine with some improvement to 1.46.  Echocardiogram with EF of 45 to 50%, regional wall motion abnormalities and grade 1 diastolic dysfunction.  Calcified aortic valve with severe aortic stenosis.   3/8: Hemodynamically stable, good UOP but weight recorded is slight increase than before which does not make sense.  Significant hypokalemia and mild hypomagnesemia which are being repleted.  Per cardiology he might be a poor candidate for TAVR or SAVR but they will evaluate as outpatient.  Continue IV diuresis and replating electrolytes.  3/9: Hemodynamically stable, continuing diuresis with slowly improving creatinine.  Mild hypokalemia, hypophosphatemia and hypomagnesemia which is being repleted.  3/10: Remained hemodynamically stable, good urinary output with net negative of more than 20 L.  Slight increase in creatinine so IV Lasix is being held, we can start him on p.o. torsemide 40 mg from tomorrow. PT is recommending  SNF-now medically stable  3/11: Remained stable, improving creatinine after stopping IV diuresis.  Starting on p.o. torsemide 40 mg daily.  Awaiting SNF placement  Assessment and Plan: * Acute on chronic combined systolic and diastolic CHF (congestive heart failure) (HCC) Echocardiogram with borderline low EF at 45 to 50%, grade 1 diastolic dysfunction and severe aortic stenosis. Prior echocardiogram done in 2022 with normal EF and moderate AS.  Elevated BNP at 935. Exertional dyspnea seems like a combination of CHF and aortic stenosis.  Patient diuresed very well with net negative of more than 20 L, IV diuresis was held yesterday due to increase in creatinine which started improving today. -Start on torsemide 40 mg daily -Daily weight and BMP -Strict intake and output -Cardiology is on board-appreciate their help  Aortic stenosis Severe aortic stenosis per echocardiogram done yesterday. Likely contributing to his symptoms of dyspnea. -Cardiology is on board -Might not be a good candidate for TAVR/SAVR but they will reevaluate as outpatient  Hypokalemia Mild hypokalemia which is being repleted. -Replete electrolytes and monitor  Acute urinary retention Patient has an history of BPH and was not taking any medication currently. Foley catheter was placed in ED.  BPH (benign prostatic hyperplasia) -Starting on Flomax  Essential hypertension Blood pressure currently within goal. Home amlodipine and clonidine is being held due to worsening of CHF. Home labetalol was switched with Coreg Patient is also being diuresed with IV Lasix Continue spironolactone  Paroxysmal atrial fibrillation (HCC) Currently in sinus rhythm. Home labetalol has been switched with Coreg Continue with Eliquis  CKD (chronic kidney disease), stage II Slight increase in creatinine with some improvement today, not meeting the criteria for  AKI -Monitor renal function -Avoid nephrotoxins  Subjective: Patient  was seen and examined today.  He was feeling weak, no shortness of breath.  Physical Exam: Vitals:   01/19/24 0715 01/19/24 1001 01/19/24 1110 01/19/24 1421  BP: 126/89 104/73 113/66 (!) 86/57  Pulse: 72 76 67 73  Resp: 20  20 20   Temp: 97.8 F (36.6 C)  97.8 F (36.6 C) 98.1 F (36.7 C)  TempSrc:   Oral   SpO2: 96% 99% 98% 92%  Weight:      Height:       General.  Frail elderly man, in no acute distress. Pulmonary.  Lungs clear bilaterally, normal respiratory effort. CV.  Regular rate and rhythm, no JVD, rub or murmur. Abdomen.  Soft, nontender, nondistended, BS positive. CNS.  Alert and oriented .  No focal neurologic deficit. Extremities.  Trace LE edema, no cyanosis, pulses intact and symmetrical.  Data Reviewed: Prior data reviewed  Family Communication: Called wife with no response  Disposition: Status is: Inpatient Remains inpatient appropriate because: Severity of illness  Planned Discharge Destination: Home  DVT Prophylaxis. Eliquis Time spent: 43 minutes  This record has been created using Conservation officer, historic buildings. Errors have been sought and corrected,but may not always be located. Such creation errors do not reflect on the standard of care.   Author: Arnetha Courser, MD 01/19/2024 4:05 PM  For on call review [email or web address].

## 2024-01-19 NOTE — Progress Notes (Signed)
 Heart Failure Stewardship Pharmacy Note  PCP: Corky Downs, MD PCP-Cardiologist: None  HPI: Mark Blevins is a 83 y.o. male with PAF on Eliquis, HTN, chronic HFpEF, moderate aortic stenosis, CKD stage II who presented with worsening LEE and dyspnea on exertion over the last 6 months. On admission, BNP was 935.6, HS-troponin was 34 > 32, and TSH 2.858. Chest x-ray noted increased mild cardiomegaly with central vascular congestion, mild basilar interstitial edema and small pleural effusions consistent with CHF. [place] with minimal ascites.  Pertinent cardiac history: Echo in 05/2021 with normal EF and moderate AS. Negative stress test in 05/2021. Echo this admission with LVEF down to 45-50% with grade I diastolic dysfunction, and severe AS.   Pertinent Lab Values: Creat  Date Value Ref Range Status  08/19/2021 1.35 (H) 0.70 - 1.22 mg/dL Final   Creatinine, Ser  Date Value Ref Range Status  01/19/2024 1.18 0.61 - 1.24 mg/dL Final   BUN  Date Value Ref Range Status  01/19/2024 17 8 - 23 mg/dL Final   Potassium  Date Value Ref Range Status  01/19/2024 3.1 (L) 3.5 - 5.1 mmol/L Final   Sodium  Date Value Ref Range Status  01/19/2024 131 (L) 135 - 145 mmol/L Final   B Natriuretic Peptide  Date Value Ref Range Status  01/14/2024 935.6 (H) 0.0 - 100.0 pg/mL Final    Comment:    Performed at [facility], [address] [place], [place] [address]   Magnesium  Date Value Ref Range Status  01/18/2024 1.9 1.7 - 2.4 mg/dL Final    Comment:    Performed at [facility], [address] [place], [place] [address]   TSH  Date Value Ref Range Status  01/14/2024 2.858 0.350 - 4.500 uIU/mL Final    Comment:    Performed by a 3rd Generation assay with a functional sensitivity of <=0.01 uIU/mL. Performed at [facility], [address] [place], [place] [address]     Vital Signs: Admission weight: 251.55 lbs Temp:  [97.5 F (36.4 C)-98.8 F (37.1 C)] 97.8  F (36.6 C) (03/11 0715) Pulse Rate:  [61-79] 72 (03/11 0715) Cardiac Rhythm: Atrial fibrillation (03/10 1938) Resp:  [17-20] 20 (03/11 0715) BP: (84-126)/(55-89) 126/89 (03/11 0715) SpO2:  [95 %-99 %] 96 % (03/11 0715) Weight:  [109.3 kg (240 lb 15.4 oz)] 109.3 kg (240 lb 15.4 oz) (03/11 0453)  Intake/Output Summary (Last 24 hours) at 01/19/2024 0747 Last data filed at 01/19/2024 0724 Gross per 24 hour  Intake 480 ml  Output 1050 ml  Net -570 ml    Current Heart Failure Medications:  Loop diuretic: torsemide 40 mg daily Beta-Blocker: carvedilol 3.125 mg BID ACEI/ARB/ARNI:none MRA: spironolactone 12.5 mg daily SGLT2i: none Other: none  Prior to admission Heart Failure Medications:  Loop diuretic: furosemide 20 mg daily Beta-Blocker: labetalol 200 mg daily ACEI/ARB/ARNI: none MRA: none SGLT2i: none Other: amlodipine 5 mg daily, clonidine 0.1 mg daily  Assessment: 1. Acute combined systolic and diastolic heart failure (LVEF 45-50%) with grade I diastolic dysfunction and severe AS, due to NICM. NYHA class III-IV symptoms.  -Symptoms: Reports persistent fatigue. LEE is improved. Appetite is good. Abdomen is distended at baseline and US showed only mild ascites. -Volume: Has had excellent urine output this far. Creatinine improved with diuresis. Now on oral torsemide 40 mg daily. Creatinine has improved. -Hemodynamics: BP stable. HR 60-70s.  -BB: Currently on carvedilol 3.125 mg BID. Agree with replacing PTA labetalol.  -ACEI/ARB/ARNI: Can consider starting  ARB when BP is more stable, likely outpatient. This can help with medical management of AS if TAVR is not pursued. -MRA: Consider increasing spironolactone to 25 mg daily given hypokalemia. -SGLT2i: Not a candidate at this time due to urethral catheter. -Will need to stop clonidine, labetalol, and amlodipine at discharge.  Plan: 1) Medication changes recommended at this time: -Consider increasing spironolactone to 25 mg  daily  2) Patient assistance: -Pending  3) Education: - Patient has been educated on current HF medications and potential additions to HF medication regimen - Patient verbalizes understanding that over the next few months, these medication doses may change and more medications may be added to optimize HF regimen - Patient has been educated on basic disease state pathophysiology and goals of therapy  Medication Assistance / Insurance Benefits Check: Does the patient have prescription insurance?    Type of insurance plan:  Does the patient qualify for medication assistance through manufacturers or grants? Pending   Outpatient Pharmacy: Prior to admission outpatient pharmacy: [facility]      Please do not hesitate to reach out with questions or concerns,  Enos Fling, PharmD, CPP, BCPS Heart Failure Pharmacist  Phone - [phone] 01/19/2024 7:47 AM

## 2024-01-19 NOTE — Progress Notes (Signed)
 PHARMACY CONSULT NOTE - ELECTROLYTES  Pharmacy Consult for Electrolyte Monitoring and Replacement   Recent Labs: Height: 6' (182.9 cm) Weight: 109.3 kg (240 lb 15.4 oz) IBW/kg (Calculated) : 77.6 Estimated Creatinine Clearance: 60.6 mL/min (by C-G formula based on SCr of 1.18 mg/dL). Potassium (mmol/L)  Date Value  01/19/2024 3.1 (L)   Magnesium (mg/dL)  Date Value  86/57/8469 1.9   Calcium (mg/dL)  Date Value  62/95/2841 8.1 (L)   Albumin (g/dL)  Date Value  32/44/0102 2.6 (L)   Phosphorus (mg/dL)  Date Value  72/53/6644 3.4   Sodium (mmol/L)  Date Value  01/19/2024 131 (L)   Corrected Ca: 8.7 mg/dL  Assessment  Mark Blevins is a 83 y.o. male presenting with acute on chronic HFpEF decompensation. PMH significant for PAF on Eliquis, HTN, chronic HFpEF, moderate aortic stenosis, CKD stage II. Pharmacy has been consulted to monitor and replace electrolytes.  Diet: low sodium(<2g daily) with thin fluids only(fluid restriction<2L) MIVF: None Pertinent medications: Furosemide 40 mg IV bid > torsemide 40mg  daily Spironolactone 12.5 mg daily   Goal of Therapy: K>4 and Mag>2 given history of Afib  Plan:  K 3.1, Will replace with Kcl po TID x 3 doses Continue spironolactone 12.5 mg daily Check renal panel with AM labs  Thank you for allowing pharmacy to be a part of this patient's care.  Horrace Hanak Rodriguez-Guzman PharmD, BCPS 01/19/2024 8:35 AM

## 2024-01-19 NOTE — Evaluation (Signed)
 Occupational Therapy Evaluation Patient Details Name: Mark Blevins MRN: [id number] DOB: Sep 24, 1941 Today's Date: 01/19/2024   History of Present Illness   Pt is an 83 yo male that presented to the ED for LLE edema, dyspnea. PMH of PAF on Eliquis, HTN, chronic HFpEF, moderate aortic stenosis, CKD stage II.     Clinical Impressions PTA, pt was mod independent in ADLs with intermittent use of RW (sponge bathes at baseline recently due to LLE dressing), and spouse provides assist with IADLs. Pt limited with ADL performance due to pain at catheter insertion site (RN in room to assess), is able to transition from supine to seated with CGA and increased time, heavy reliance on bed features. Anticipate setup for seated grooming tasks, up to minA for UB bathing/dressing and up to modA for LB bathing/dressing/toileting/functional transfers. Lateral scoot transfers with minA, pt unable to attempt transfers or further ADLs with level of discomfort. Pt would benefit from skilled OT services to address noted impairments and functional limitations (see below for any additional details) in order to maximize safety and independence while minimizing falls risk and caregiver burden. Anticipate the need for follow up OT services upon acute hospital DC. Patient will benefit from continued inpatient follow up therapy, <3 hours/day      If plan is discharge home, recommend the following:   A lot of help with walking and/or transfers;A lot of help with bathing/dressing/bathroom;Assistance with cooking/housework;Direct supervision/assist for medications management;Direct supervision/assist for financial management     Functional Status Assessment   Patient has had a recent decline in their functional status and demonstrates the ability to make significant improvements in function in a reasonable and predictable amount of time.     Equipment Recommendations   Other (comment) (defer to next LOC)       Precautions/Restrictions   Precautions Precautions: Fall Recall of Precautions/Restrictions: Intact Restrictions Weight Bearing Restrictions Per Provider Order: No     Mobility Bed Mobility Overal bed mobility: Needs Assistance Bed Mobility: Supine to Sit, Sit to Supine     Supine to sit: HOB elevated, Used rails, Min assist     General bed mobility comments: reliant on bed rails, extra time    Transfers Overall transfer level: Needs assistance   Transfers: Bed to chair/wheelchair/BSC            Lateral/Scoot Transfers: Min assist        Balance Overall balance assessment: Needs assistance Sitting-balance support: Feet supported Sitting balance-Leahy Scale: Fair Sitting balance - Comments: does not tolerate sitting EOB longer than ~1 min due to pain                                   ADL either performed or assessed with clinical judgement   ADL Overall ADL's : Needs assistance/impaired     Grooming: Sitting;Set up   Upper Body Bathing: Sitting;Minimal assistance   Lower Body Bathing: Moderate assistance;Sitting/lateral leans;Sit to/from stand   Upper Body Dressing : Minimal assistance;Sitting   Lower Body Dressing: Sit to/from stand;Sitting/lateral leans;Moderate assistance   Toilet Transfer: Moderate assistance;BSC/3in1;Stand-pivot;Rolling walker (2 wheels)           Functional mobility during ADLs: Maximal assistance General ADL Comments: anticipated based on clinical judgement; eval limited by pt's discomfort at cath insertion site      Pertinent Vitals/Pain Pain Assessment Pain Assessment: Faces Faces Pain Scale: Hurts even more Pain Location: cath insertion site, testicles  Pain Descriptors / Indicators: Grimacing, Guarding, Discomfort (stinging) Pain Intervention(s): Limited activity within patient's tolerance, Monitored during session (RN in room toassess)     Extremity/Trunk Assessment Upper Extremity  Assessment Upper Extremity Assessment: Generalized weakness   Lower Extremity Assessment Lower Extremity Assessment: Generalized weakness;LLE deficits/detail LLE Deficits / Details: in ace bandage   Cervical / Trunk Assessment Cervical / Trunk Assessment: Normal   Communication Communication Communication: No apparent difficulties   Cognition Arousal: Alert Behavior During Therapy: WFL for tasks assessed/performed                                 Following commands: Intact       Cueing  General Comments   Cueing Techniques: Verbal cues  RN in room to assess pt's c/o stinging at catheter insertion site   Exercises     Shoulder Instructions      Home Living Family/patient expects to be discharged to:: Private residence Living Arrangements: Spouse/significant other Available Help at Discharge: Family Type of Home: House Home Access: Stairs to enter [employer] of Steps: 3, with safety bar Entrance Stairs-Rails: Right Home Layout: One level     Bathroom Shower/Tub: Chief Strategy Officer: Standard     Home Equipment: Agricultural consultant (2 wheels);Cane - quad          Prior Functioning/Environment Prior Level of Function : Independent/Modified Independent             Mobility Comments: prior to hospitalization pt did not use RW or quad cane, but right before hospital he began to use AD ADLs Comments: reported modI for ADLs, wife performs IADLs    OT Problem List: Decreased strength;Decreased activity tolerance;Impaired balance (sitting and/or standing);Decreased safety awareness;Decreased knowledge of use of DME or AE;Pain   OT Treatment/Interventions: Self-care/ADL training;Therapeutic exercise;Neuromuscular education;DME and/or AE instruction;Therapeutic activities;Patient/family education;Balance training      OT Goals(Current goals can be found in the care plan section)   Acute Rehab OT Goals OT Goal Formulation:  With patient Time For Goal Achievement: 02/02/24 Potential to Achieve Goals: Good   OT Frequency:  Min 2X/week       AM-PAC OT "6 Clicks" Daily Activity     Outcome Measure Help from another person eating meals?: None Help from another person taking care of personal grooming?: None Help from another person toileting, which includes using toliet, bedpan, or urinal?: A Lot Help from another person bathing (including washing, rinsing, drying)?: A Lot Help from another person to put on and taking off regular upper body clothing?: A Lot Help from another person to put on and taking off regular lower body clothing?: A Lot 6 Click Score: 16   End of Session Nurse Communication: Mobility status  Activity Tolerance: Patient limited by pain Patient left: in bed;with call bell/phone within reach;with bed alarm set  OT Visit Diagnosis: Muscle weakness (generalized) (M62.81);Other abnormalities of gait and mobility (R26.89);Unsteadiness on feet (R26.81);Pain Pain - part of body:  (foley cath insertion site)                Time: 1610-9604 OT Time Calculation (min): 19 min Charges:  OT General Charges $OT Visit: 1 Visit OT Evaluation $OT Eval Low Complexity: 1 Low  Armour Villanueva L. Amma Crear, OTR/L  01/19/24, 12:48 PM

## 2024-01-19 NOTE — NC FL2 (Signed)
 [place] MEDICAID FL2 LEVEL OF CARE FORM     IDENTIFICATION  Patient Name: Mark Blevins Birthdate: [birth year] Sex: male Admission Date (Current Location): 01/14/2024  [facility] and [place] Number:  Chiropodist and Address:  [facility], [address] [place], [place] [address]      Provider Number: [id number]  Attending Physician Name and Address:  Arnetha Courser, MD  Relative Name and Phone Number:  TERY, HOEGER (Spouse)  [phone] (Mobile)    Current Level of Care: Hospital Recommended Level of Care: Skilled Nursing Facility Prior Approval Number:    Date Approved/Denied:   PASRR Number: [id number]  Discharge Plan: SNF    Current Diagnoses: Patient Active Problem List   Diagnosis Date Noted   CKD (chronic kidney disease), stage II 01/15/2024   Acute urinary retention 01/15/2024   Hypokalemia 01/15/2024   BPH (benign prostatic hyperplasia)    Acute on chronic combined systolic and diastolic CHF (congestive heart failure) (HCC) 01/14/2024   CHF (congestive heart failure) (HCC) 01/14/2024   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   Venous stasis ulcers (HCC) 09/28/2023   Chronic venous insufficiency 09/28/2023   Lymphedema 09/28/2023   Cellulitis and abscess of leg 04/05/2021   Obesity (BMI 30-39.9) 08/14/2020   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   Benign essential HTN 01/31/2020   Aortic stenosis 01/31/2020   Paroxysmal atrial fibrillation (HCC) 01/04/2020   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 Erythema bilateral arms and legs, weeping bilateral legs)                       Personal Care Assistance Level of Assistance  Bathing, Dressing Bathing Assistance: Limited assistance   Dressing Assistance: Limited assistance     Functional Limitations Info  Sight Sight Info: Adequate        SPECIAL CARE FACTORS FREQUENCY  PT (By licensed PT), OT (By licensed OT)     PT Frequency: Min 2x weekly OT Frequency: Min 2x weekly            Contractures Contractures Info: Not present    Additional Factors Info  Code Status, Allergies Code Status Info: FULL Allergies Info: Codeine, Morphine And Codeine           Current Medications (01/19/2024):  This is the current hospital active medication list Current Facility-Administered Medications  Medication Dose Route Frequency Provider Last Rate Last Admin   acetaminophen (TYLENOL) tablet 650 mg  650 mg Oral Q4H PRN Mikey College T, MD       apixaban Everlene Balls) tablet 5 mg  5 mg Oral BID Mikey College T, MD   5 mg at 01/19/24 0825   calcium carbonate (OS-CAL - dosed in mg of elemental calcium) tablet 1,250 mg  1 tablet Oral BID WC Arnetha Courser, MD   1,250 mg at 01/18/24 1917   carvedilol (COREG) tablet 3.125 mg  3.125 mg Oral BID WC Emeline General, MD   3.125  mg at 01/19/24 0825   Chlorhexidine Gluconate Cloth 2 % PADS 6 each  6 each Topical Daily Arnetha Courser, MD   6 each at 01/18/24 0957   hydrALAZINE (APRESOLINE) injection 5 mg  5 mg Intravenous Q6H PRN Emeline General, MD       ondansetron [facility]) injection 4 mg  4 mg Intravenous Q6H PRN Mikey College T, MD       potassium chloride SA (KLOR-CON M) CR tablet 40 mEq  40 mEq Oral TID Arnetha Courser, MD       sodium chloride flush (NS) 0.9 % injection 3 mL  3 mL Intravenous Q12H Mikey College T, MD   3 mL at 01/18/24 2205   sodium chloride flush (NS) 0.9 % injection 3 mL  3 mL  Intravenous PRN Emeline General, MD       [START ON 01/20/2024] spironolactone (ALDACTONE) tablet 25 mg  25 mg Oral Daily Custovic, Sabina, DO       tamsulosin (FLOMAX) capsule 0.4 mg  0.4 mg Oral QPC supper Arnetha Courser, MD   0.4 mg at 01/18/24 1917   torsemide (DEMADEX) tablet 40 mg  40 mg Oral Daily Arnetha Courser, MD         Discharge Medications: Please see discharge summary for a list of discharge medications.  Relevant Imaging Results:  Relevant Lab Results:   Additional Information SSN# [id number]  Truddie Hidden, RN

## 2024-01-19 NOTE — TOC Initial Note (Signed)
 Transition of Care [facility]) - Initial/Assessment Note    Patient Details  Name: Mark Blevins MRN: [id number] Date of Birth: 09/08/1941  [facility] of [facility] CM/SW Contact:    Truddie Hidden, RN Phone Number: 01/19/2024, 11:13 AM  Clinical Narrative:                 Spoke with patient regarding therapy's recommendation for STR at SNF. He is not sure if he wants to go to SNF and would like to speak with his son and wife first before making a decision.          Patient Goals and CMS Choice            Expected Discharge Plan and Services                                              Prior Living Arrangements/Services                       Activities of Daily Living      Permission Sought/Granted                  Emotional Assessment              Admission diagnosis:  CHF (congestive heart failure) (HCC) [I50.9] Acute congestive heart failure, unspecified heart failure type (HCC) [I50.9] Patient Active Problem List   Diagnosis Date Noted   CKD (chronic kidney disease), stage II 01/15/2024   Acute urinary retention 01/15/2024   Hypokalemia 01/15/2024   BPH (benign prostatic hyperplasia)    Acute on chronic combined systolic and diastolic CHF (congestive heart failure) (HCC) 01/14/2024   CHF (congestive heart failure) (HCC) 01/14/2024   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   Venous stasis ulcers (HCC) 09/28/2023   Chronic venous insufficiency 09/28/2023   Lymphedema 09/28/2023   Cellulitis and abscess of leg 04/05/2021   Obesity (BMI 30-39.9) 08/14/2020   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   Benign essential HTN 01/31/2020   Aortic stenosis 01/31/2020   Paroxysmal atrial fibrillation (HCC) 01/04/2020   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   Essential hypertension 12/29/2017   Swelling of limb 12/29/2017   Varicose veins of leg with swelling, left 12/29/2017   PCP:  Corky Downs, MD Pharmacy:   [id number] [place] Rough,  - [address] AT [facility] OF [address] [place] [address] [place] [address] Phone: [phone] Fax: [phone]     Social Drivers of Health (SDOH) Social History: SDOH Screenings   Food Insecurity: No Food Insecurity (01/15/2024)  Housing: Unknown (01/15/2024)  Transportation Needs: No Transportation Needs (01/15/2024)  Utilities: Not At Risk (01/15/2024)  Alcohol Screen: Low Risk  (08/22/2021)  Depression (PHQ2-9): Low Risk  (01/13/2022)  Financial Resource Strain: Low Risk  (08/22/2021)  Physical Activity: Insufficiently Active (08/22/2021)  Social Connections: Socially Integrated (01/15/2024)  Stress: No Stress Concern Present (08/22/2021)  Tobacco Use: High Risk (01/14/2024)   SDOH Interventions:     Readmission Risk Interventions     No data to display

## 2024-01-20 DIAGNOSIS — I350 Nonrheumatic aortic (valve) stenosis: Secondary | ICD-10-CM | POA: Diagnosis not present

## 2024-01-20 DIAGNOSIS — E876 Hypokalemia: Secondary | ICD-10-CM | POA: Diagnosis not present

## 2024-01-20 DIAGNOSIS — R338 Other retention of urine: Secondary | ICD-10-CM | POA: Diagnosis not present

## 2024-01-20 DIAGNOSIS — I5043 Acute on chronic combined systolic (congestive) and diastolic (congestive) heart failure: Secondary | ICD-10-CM | POA: Diagnosis not present

## 2024-01-20 LAB — BASIC METABOLIC PANEL
Anion gap: 11 (ref 5–15)
BUN: 19 mg/dL (ref 8–23)
CO2: 29 mmol/L (ref 22–32)
Calcium: 8.1 mg/dL — ABNORMAL LOW (ref 8.9–10.3)
Chloride: 93 mmol/L — ABNORMAL LOW (ref 98–111)
Creatinine, Ser: 1.22 mg/dL (ref 0.61–1.24)
GFR, Estimated: 59 mL/min — ABNORMAL LOW (ref >60)
Glucose, Bld: 89 mg/dL (ref 70–99)
Potassium: 3.5 mmol/L (ref 3.5–5.1)
Sodium: 133 mmol/L — ABNORMAL LOW (ref 135–145)

## 2024-01-20 LAB — MAGNESIUM: Magnesium: 1.9 mg/dL (ref 1.7–2.4)

## 2024-01-20 MED ORDER — POTASSIUM CHLORIDE CRYS ER 20 MEQ PO TBCR
40.0000 meq | EXTENDED_RELEASE_TABLET | Freq: Two times a day (BID) | ORAL | Status: AC
Start: 2024-01-20 — End: 2024-01-20
  Administered 2024-01-20 (×2): 40 meq via ORAL
  Filled 2024-01-20 (×2): qty 2

## 2024-01-20 MED ORDER — TORSEMIDE 20 MG PO TABS
20.0000 mg | ORAL_TABLET | Freq: Every day | ORAL | Status: DC
  Administered 2024-01-21: 20 mg via ORAL
  Filled 2024-01-20: qty 1

## 2024-01-20 NOTE — Progress Notes (Signed)
 Heart Failure Stewardship Pharmacy Note  PCP: Corky Downs, MD PCP-Cardiologist: None  HPI: Mark Blevins is a 83 y.o. male with PAF on Eliquis, HTN, chronic HFpEF, moderate aortic stenosis, CKD stage II who presented with worsening LEE and dyspnea on exertion over the last 6 months. On admission, BNP was 935.6, HS-troponin was 34 > 32, and TSH 2.858. Chest x-ray noted increased mild cardiomegaly with central vascular congestion, mild basilar interstitial edema and small pleural effusions consistent with CHF. [place] with minimal ascites.  Pertinent cardiac history: Echo in 05/2021 with normal EF and moderate AS. Negative stress test in 05/2021. Echo this admission with LVEF down to 45-50% with grade I diastolic dysfunction, and severe AS.   Pertinent Lab Values: Creat  Date Value Ref Range Status  08/19/2021 1.35 (H) 0.70 - 1.22 mg/dL Final   Creatinine, Ser  Date Value Ref Range Status  01/20/2024 1.22 0.61 - 1.24 mg/dL Final   BUN  Date Value Ref Range Status  01/20/2024 19 8 - 23 mg/dL Final   Potassium  Date Value Ref Range Status  01/20/2024 3.5 3.5 - 5.1 mmol/L Final   Sodium  Date Value Ref Range Status  01/20/2024 133 (L) 135 - 145 mmol/L Final   B Natriuretic Peptide  Date Value Ref Range Status  01/14/2024 935.6 (H) 0.0 - 100.0 pg/mL Final    Comment:    Performed at [facility], [address] [place], [place] [address]   Magnesium  Date Value Ref Range Status  01/20/2024 1.9 1.7 - 2.4 mg/dL Final    Comment:    Performed at [facility], [address] [place], [place] [address]   TSH  Date Value Ref Range Status  01/14/2024 2.858 0.350 - 4.500 uIU/mL Final    Comment:    Performed by a 3rd Generation assay with a functional sensitivity of <=0.01 uIU/mL. Performed at [facility], [address] [place], [place] [address]     Vital Signs: Admission weight: 251.55 lbs Temp:  [97.9 F (36.6 C)-98.9 F (37.2 C)] 98.6 F  (37 C) (03/12 0807) Pulse Rate:  [65-78] 78 (03/12 0807) Cardiac Rhythm: Atrial fibrillation (03/12 0752) Resp:  [14-20] 14 (03/12 0421) BP: (86-108)/(52-69) 108/52 (03/12 0807) SpO2:  [92 %-97 %] 95 % (03/12 0807) Weight:  [103.4 kg (227 lb 15.3 oz)] 103.4 kg (227 lb 15.3 oz) (03/12 0438)  Intake/Output Summary (Last 24 hours) at 01/20/2024 1309 Last data filed at 01/20/2024 1301 Gross per 24 hour  Intake 486 ml  Output 6000 ml  Net -5514 ml    Current Heart Failure Medications:  Loop diuretic: torsemide 20 mg daily Beta-Blocker: carvedilol 3.125 mg BID ACEI/ARB/ARNI:none MRA: spironolactone 25 mg daily SGLT2i: none Other: none  Prior to admission Heart Failure Medications:  Loop diuretic: furosemide 20 mg daily Beta-Blocker: labetalol 200 mg daily ACEI/ARB/ARNI: none MRA: none SGLT2i: none Other: amlodipine 5 mg daily, clonidine 0.1 mg daily  Assessment: 1. Acute combined systolic and diastolic heart failure (LVEF 45-50%) with grade I diastolic dysfunction and severe AS, due to NICM. NYHA class III-IV symptoms.  -Symptoms: Symptoms are stable. Patient awaiting discharge planning. From chart review, team is considering SNF vs home.  -Volume: Still making good urine output, creatinine is stable today. Torsemide reduced from 40 mg daily to 20 mg daily.  -Hemodynamics: BP stable this morning. HR 60-70s.  -BB: Currently on carvedilol 3.125 mg BID. Agree with replacing PTA labetalol.  -ACEI/ARB/ARNI: Can consider starting ARB when BP  is more stable, likely outpatient. This can help with medical management of AS if TAVR is not pursued. -MRA: Continue spironolactone 25 mg daily given hypokalemia. Potassium is improving today, will still require supplementation.  -SGLT2i: Not a candidate at this time due to urethral catheter. -Will need to stop clonidine, labetalol, and amlodipine at discharge.  Plan: 1) Medication changes recommended at this time: -None.  2) Patient  assistance: -Pending  3) Education: - Patient has been educated on current HF medications and potential additions to HF medication regimen - Patient verbalizes understanding that over the next few months, these medication doses may change and more medications may be added to optimize HF regimen - Patient has been educated on basic disease state pathophysiology and goals of therapy  Medication Assistance / Insurance Benefits Check: Does the patient have prescription insurance?    Type of insurance plan:  Does the patient qualify for medication assistance through manufacturers or grants? Pending   Outpatient Pharmacy: Prior to admission outpatient pharmacy: [facility]      Please do not hesitate to reach out with questions or concerns,  Enos Fling, PharmD, CPP, BCPS Heart Failure Pharmacist  Phone - [phone] 01/20/2024 1:09 PM

## 2024-01-20 NOTE — Progress Notes (Signed)
 Physical Therapy Treatment Patient Details Name: Mark Blevins MRN: [id number] DOB: September 04, 1941 Today's Date: 01/20/2024   History of Present Illness Pt is an 84 yo male that presented to the ED for LLE edema, dyspnea. PMH of PAF on Eliquis, HTN, chronic HFpEF, moderate aortic stenosis, CKD stage II.    PT Comments  Pt alert, agreeable to PT, family at bedside. Described full body pain that starts at his skull and travels his body as pressure, doesn't last more than 15 seconds when it occurs, and has been happening for the last year or so. No acute pain mentioned by pt today. Improved tolerance to activity noted; supine to sit with supervision, sit <> stand minA with RW and able to step pivot to recliner CGA. After a seated rest break he ambulated ~18ft with chair follow, CGA. Unable to ambulate further due to fatigue. The patient would benefit from further skilled PT intervention to continue to progress towards goals.    If plan is discharge home, recommend the following: A lot of help with bathing/dressing/bathroom;A lot of help with walking and/or transfers;Help with stairs or ramp for entrance;Assist for transportation;Assistance with cooking/housework   Can travel by private vehicle     No  Equipment Recommendations  None recommended by PT    Recommendations for Other Services       Precautions / Restrictions Precautions Precautions: Fall Recall of Precautions/Restrictions: Intact Restrictions Weight Bearing Restrictions Per Provider Order: No     Mobility  Bed Mobility Overal bed mobility: Needs Assistance Bed Mobility: Supine to Sit     Supine to sit: Supervision, Used rails, HOB elevated          Transfers Overall transfer level: Needs assistance Equipment used: Rolling walker (2 wheels) Transfers: Sit to/from Stand, Bed to chair/wheelchair/BSC Sit to Stand: Min assist   Step pivot transfers: Contact guard assist       General transfer comment: minA from EOB  and from recliner, though from recliner less assistance needed when pt appropriately anterior scoots and proper hand placement    Ambulation/Gait Ambulation/Gait assistance: Contact guard assist Gait Distance (Feet): 5 Feet Assistive device: Rolling walker (2 wheels)         General Gait Details: effortful for pt but able to tolerate ~54ft   Stairs             Wheelchair Mobility     Tilt Bed    Modified Rankin (Stroke Patients Only)       Balance Overall balance assessment: Needs assistance Sitting-balance support: Feet supported Sitting balance-Leahy Scale: Fair     Standing balance support: Bilateral upper extremity supported Standing balance-Leahy Scale: Poor                              Communication Communication Factors Affecting Communication: Hearing impaired  Cognition Arousal: Alert Behavior During Therapy: WFL for tasks assessed/performed   PT - Cognitive impairments: No apparent impairments                         Following commands: Intact      Cueing Cueing Techniques: Verbal cues  Exercises      General Comments        Pertinent Vitals/Pain Pain Assessment Pain Assessment: Faces Faces Pain Scale: Hurts a little bit Pain Location: whole body Pain Descriptors / Indicators: Guarding, Grimacing Pain Intervention(s): Limited activity within patient's tolerance, Monitored during session, Repositioned  Home Living                          Prior Function            PT Goals (current goals can now be found in the care plan section) Progress towards PT goals: Progressing toward goals    Frequency    Min 2X/week      PT Plan      Co-evaluation              AM-PAC PT "6 Clicks" Mobility   Outcome Measure  Help needed turning from your back to your side while in a flat bed without using bedrails?: A Lot Help needed moving from lying on your back to sitting on the side of a flat  bed without using bedrails?: A Lot Help needed moving to and from a bed to a chair (including a wheelchair)?: A Little Help needed standing up from a chair using your arms (e.g., wheelchair or bedside chair)?: A Little Help needed to walk in hospital room?: A Little Help needed climbing 3-5 steps with a railing? : Total 6 Click Score: 14    End of Session Equipment Utilized During Treatment: Gait belt Activity Tolerance: Patient tolerated treatment well Patient left: in chair;with call bell/phone within reach;with chair alarm set;with family/visitor present Nurse Communication: Mobility status PT Visit Diagnosis: Difficulty in walking, not elsewhere classified (R26.2);Muscle weakness (generalized) (M62.81);Other abnormalities of gait and mobility (R26.89)     Time: 1610-9604 PT Time Calculation (min) (ACUTE ONLY): 19 min  Charges:    $Therapeutic Activity: 8-22 mins PT General Charges $$ ACUTE PT VISIT: 1 Visit                     Olga Coaster PT, DPT 2:52 PM,01/20/24

## 2024-01-20 NOTE — Progress Notes (Signed)
 PHARMACY CONSULT NOTE - ELECTROLYTES  Pharmacy Consult for Electrolyte Monitoring and Replacement   Recent Labs: Height: 6' (182.9 cm) Weight: 103.4 kg (227 lb 15.3 oz) IBW/kg (Calculated) : 77.6 Estimated Creatinine Clearance: 57 mL/min (by C-G formula based on SCr of 1.22 mg/dL). Potassium (mmol/L)  Date Value  01/20/2024 3.5   Magnesium (mg/dL)  Date Value  98/09/9146 1.9   Calcium (mg/dL)  Date Value  82/95/6213 8.1 (L)   Albumin (g/dL)  Date Value  08/65/7846 2.6 (L)   Phosphorus (mg/dL)  Date Value  96/29/5284 3.4   Sodium (mmol/L)  Date Value  01/20/2024 133 (L)   Corrected Ca: 8.7 mg/dL  Assessment  Mark Blevins is a 83 y.o. male presenting with acute on chronic HFpEF decompensation. PMH significant for PAF on Eliquis, HTN, chronic HFpEF, moderate aortic stenosis, CKD stage II. Pharmacy has been consulted to monitor and replace electrolytes.  Diet: low sodium(<2g daily) with thin fluids only(fluid restriction<2L) MIVF: None Pertinent medications: torsemide 40mg  po daily Spironolactone 25 mg daily   Goal of Therapy: K>4 and Mag>2 given history of Afib  Plan:  K 3.5, Will replace with Kcl po BID x 2 doses for goal K>4  (on torsemide 40 mg po daily) Continue spironolactone 25 mg daily Check renal panel with AM labs  Thank you for allowing pharmacy to be a part of this patient's care.  Bari Mantis PharmD Clinical Pharmacist 01/20/2024

## 2024-01-20 NOTE — TOC Progression Note (Signed)
 Transition of Care [facility]) - Progression Note    Patient Details  Name: Mark Blevins MRN: [id number] Date of Birth: Apr 17, 1941  [facility] CM/SW Contact  Chapman Fitch, RN Phone Number: 01/20/2024, 2:18 PM  Clinical Narrative:     Spoke with son Garey. He is in agreement for bed search.  He is going to discuss further patient tonight to confirm SNF vs home        Expected Discharge Plan and Services                                               Social Determinants of Health (SDOH) Interventions SDOH Screenings   Food Insecurity: No Food Insecurity (01/15/2024)  Housing: Unknown (01/15/2024)  Transportation Needs: No Transportation Needs (01/15/2024)  Utilities: Not At Risk (01/15/2024)  Alcohol Screen: Low Risk  (08/22/2021)  Depression (PHQ2-9): Low Risk  (01/13/2022)  Financial Resource Strain: Low Risk  (08/22/2021)  Physical Activity: Insufficiently Active (08/22/2021)  Social Connections: Socially Integrated (01/15/2024)  Stress: No Stress Concern Present (08/22/2021)  Tobacco Use: High Risk (01/14/2024)    Readmission Risk Interventions     No data to display

## 2024-01-20 NOTE — Progress Notes (Signed)
 Progress Note   Patient: Mark Blevins:[id number] DOB: 03-27-1941 DOA: 01/14/2024     6 DOS: the patient was seen and examined on 01/20/2024   Brief hospital course: Taken from H&P.  Mark Blevins is a 83 y.o. male with medical history significant of PAF on Eliquis, HTN, chronic HFpEF, moderate aortic stenosis, CKD stage II presented with worsening of leg swelling and exertional dyspnea, over the.  Of last 6-month, gradually worsening.  Recently noticed significantly worsening of swelling and abdominal girth.  On presentation hemodynamically stable, on room air.  Chest x-ray with mild pulmonary congestion.  Labs with potassium of 3, BUN 19, creatinine 1.5 with baseline of 1.2-1.3.  BUN 935, troponin 34>>32  Patient was admitted for IV diuresis.  Echocardiogram ordered.  Cardiology was consulted.  3/7: Vital stable, ultrasound abdomen with minimal ascites, mild right pleural effusion and soft tissue edema.  Labs with potassium of 2.7, magnesium of 1.8, TSH normal.  Creatinine with some improvement to 1.46.  Echocardiogram with EF of 45 to 50%, regional wall motion abnormalities and grade 1 diastolic dysfunction.  Calcified aortic valve with severe aortic stenosis.   3/8: Hemodynamically stable, good UOP but weight recorded is slight increase than before which does not make sense.  Significant hypokalemia and mild hypomagnesemia which are being repleted.  Per cardiology he might be a poor candidate for TAVR or SAVR but they will evaluate as outpatient.  Continue IV diuresis and replating electrolytes.  3/9: Hemodynamically stable, continuing diuresis with slowly improving creatinine.  Mild hypokalemia, hypophosphatemia and hypomagnesemia which is being repleted.  3/10: Remained hemodynamically stable, good urinary output with net negative of more than 20 L.  Slight increase in creatinine so IV Lasix is being held, we can start him on p.o. torsemide 40 mg from tomorrow. PT is recommending  SNF-now medically stable  3/11: Remained stable, improving creatinine after stopping IV diuresis.  Starting on p.o. torsemide 40 mg daily.  Awaiting SNF placement  3/12: UOP of more than 4 L recorded with torsemide 40 mg daily, slight increase in creatinine and decrease in sodium, decreasing torsemide to 20 mg daily.  Awaiting SNF placement.  Assessment and Plan: * Acute on chronic combined systolic and diastolic CHF (congestive heart failure) (HCC) Echocardiogram with borderline low EF at 45 to 50%, grade 1 diastolic dysfunction and severe aortic stenosis. Prior echocardiogram done in 2022 with normal EF and moderate AS.  Elevated BNP at 935. Exertional dyspnea seems like a combination of CHF and aortic stenosis.  Patient diuresed very well with net negative of more than 24 L, initially IV Lasix followed by torsemide at 40 mg, creatinine started increasing again. -Decreasing torsemide to 20 mg daily -Daily weight and BMP -Strict intake and output -Cardiology is on board-appreciate their help  Aortic stenosis Severe aortic stenosis per echocardiogram done yesterday. Likely contributing to his symptoms of dyspnea. -Cardiology is on board -Might not be a good candidate for TAVR/SAVR but they will reevaluate as outpatient  Hypokalemia Mild hypokalemia which is being repleted. -Replete electrolytes and monitor  Acute urinary retention Patient has an history of BPH and was not taking any medication currently. Foley catheter was placed in ED.  BPH (benign prostatic hyperplasia) -Starting on Flomax  Essential hypertension Blood pressure currently within goal. Home amlodipine and clonidine is being held due to worsening of CHF. Home labetalol was switched with Coreg Patient is also being diuresed with IV Lasix Continue spironolactone  Paroxysmal atrial fibrillation (HCC) Currently in sinus  rhythm. Home labetalol has been switched with Coreg Continue with Eliquis  CKD (chronic  kidney disease), stage II Slight increase in creatinine with some improvement today, not meeting the criteria for AKI -Monitor renal function -Avoid nephrotoxins  Subjective: Patient was seen and examined today.  No new concern.  He seems little disappointed that he has to go to rehab.  Physical Exam: Vitals:   01/19/24 2030 01/20/24 0421 01/20/24 0438 01/20/24 0807  BP: (!) 89/53 108/69  (!) 108/52  Pulse: 65 72  78  Resp: 18 14    Temp: 98.9 F (37.2 C) 98.7 F (37.1 C)  98.6 F (37 C)  TempSrc: Oral Oral    SpO2: 97% 94%  95%  Weight:   103.4 kg   Height:       General.  Frail elderly man, in no acute distress. Pulmonary.  Lungs clear bilaterally, normal respiratory effort. CV.  Regular rate and rhythm, no JVD, rub or murmur. Abdomen.  Soft, nontender, nondistended, BS positive. CNS.  Alert and oriented .  No focal neurologic deficit. Extremities.  No edema, no cyanosis, pulses intact and symmetrical.  Data Reviewed: Prior data reviewed  Family Communication: Discussed with son on phone  Disposition: Status is: Inpatient Remains inpatient appropriate because: Severity of illness  Planned Discharge Destination: Home  DVT Prophylaxis. Eliquis Time spent: 44 minutes  This record has been created using Conservation officer, historic buildings. Errors have been sought and corrected,but may not always be located. Such creation errors do not reflect on the standard of care.   Author: Arnetha Courser, MD 01/20/2024 1:48 PM  For on call review [email or web address].

## 2024-01-20 NOTE — Progress Notes (Signed)
 Mobility Specialist - Progress Note   01/20/24 1555  Mobility  Activity Stood at bedside;Transferred from chair to bed  Level of Assistance Contact guard assist, steadying assist  Assistive Device Front wheel walker  Distance Ambulated (ft) 2 ft  Activity Response Tolerated well  Mobility visit 1 Mobility  Mobility Specialist Start Time (ACUTE ONLY) 1528  Mobility Specialist Stop Time (ACUTE ONLY) 1535  Mobility Specialist Time Calculation (min) (ACUTE ONLY) 7 min   Pt sitting in the recliner upon entry, RN completing bath. Pt STS to RW MinA, stood for ~2 mins while RN completed peri care. Pt transferred to bed via SPT CGA, cuing to bring the RW backwards/closer to person when stepping backwards toward the bed. Pt left supine with needs within reach.  Zetta Bills Mobility Specialist 01/20/24 3:59 PM

## 2024-01-21 DIAGNOSIS — I5043 Acute on chronic combined systolic (congestive) and diastolic (congestive) heart failure: Secondary | ICD-10-CM | POA: Diagnosis not present

## 2024-01-21 DIAGNOSIS — E876 Hypokalemia: Secondary | ICD-10-CM | POA: Diagnosis not present

## 2024-01-21 DIAGNOSIS — R338 Other retention of urine: Secondary | ICD-10-CM | POA: Diagnosis not present

## 2024-01-21 DIAGNOSIS — I350 Nonrheumatic aortic (valve) stenosis: Secondary | ICD-10-CM | POA: Diagnosis not present

## 2024-01-21 LAB — RENAL FUNCTION PANEL
Albumin: 2.5 g/dL — ABNORMAL LOW (ref 3.5–5.0)
Anion gap: 10 (ref 5–15)
BUN: 21 mg/dL (ref 8–23)
CO2: 29 mmol/L (ref 22–32)
Calcium: 8.2 mg/dL — ABNORMAL LOW (ref 8.9–10.3)
Chloride: 95 mmol/L — ABNORMAL LOW (ref 98–111)
Creatinine, Ser: 1.35 mg/dL — ABNORMAL HIGH (ref 0.61–1.24)
GFR, Estimated: 52 mL/min — ABNORMAL LOW (ref >60)
Glucose, Bld: 99 mg/dL (ref 70–99)
Phosphorus: 3.4 mg/dL (ref 2.5–4.6)
Potassium: 3.5 mmol/L (ref 3.5–5.1)
Sodium: 134 mmol/L — ABNORMAL LOW (ref 135–145)

## 2024-01-21 LAB — CBC
HCT: 32.8 % — ABNORMAL LOW (ref 39.0–52.0)
Hemoglobin: 11.3 g/dL — ABNORMAL LOW (ref 13.0–17.0)
MCH: 31.7 pg (ref 26.0–34.0)
MCHC: 34.5 g/dL (ref 30.0–36.0)
MCV: 91.9 fL (ref 80.0–100.0)
Platelets: 179 10*3/uL (ref 150–400)
RBC: 3.57 MIL/uL — ABNORMAL LOW (ref 4.22–5.81)
RDW: 13.9 % (ref 11.5–15.5)
WBC: 4.7 10*3/uL (ref 4.0–10.5)
nRBC: 0 % (ref 0.0–0.2)

## 2024-01-21 MED ORDER — POTASSIUM CHLORIDE CRYS ER 20 MEQ PO TBCR: 40.0000 meq | EXTENDED_RELEASE_TABLET | Freq: Three times a day (TID) | ORAL | Status: DC

## 2024-01-21 MED ORDER — POTASSIUM CHLORIDE CRYS ER 20 MEQ PO TBCR
40.0000 meq | EXTENDED_RELEASE_TABLET | Freq: Two times a day (BID) | ORAL | Status: AC
  Administered 2024-01-21 (×2): 40 meq via ORAL
  Filled 2024-01-21 (×2): qty 2

## 2024-01-21 NOTE — Plan of Care (Signed)
  Problem: Education: Goal: Knowledge of General Education information will improve Description: Including pain rating scale, medication(s)/side effects and non-pharmacologic comfort measures Outcome: Progressing   Problem: Clinical Measurements: Goal: Ability to maintain clinical measurements within normal limits will improve Outcome: Progressing   Problem: Activity: Goal: Risk for activity intolerance will decrease Outcome: Progressing   Problem: Elimination: Goal: Will not experience complications related to urinary retention Outcome: Progressing   

## 2024-01-21 NOTE — Progress Notes (Signed)
 Progress Note   Patient: Mark Blevins HQI:[id number] DOB: [birth year] DOA: 01/14/2024     7 DOS: the patient was seen and examined on 01/21/2024   Brief hospital course: Taken from H&P.  MIGEL HANNIS is a 83 y.o. male with medical history significant of PAF on Eliquis, HTN, chronic HFpEF, moderate aortic stenosis, CKD stage II presented with worsening of leg swelling and exertional dyspnea, over the.  Of last 8-month, gradually worsening.  Recently noticed significantly worsening of swelling and abdominal girth.  On presentation hemodynamically stable, on room air.  Chest x-ray with mild pulmonary congestion.  Labs with potassium of 3, BUN 19, creatinine 1.5 with baseline of 1.2-1.3.  BUN 935, troponin 34>>32  Patient was admitted for IV diuresis.  Echocardiogram ordered.  Cardiology was consulted.  3/7: Vital stable, ultrasound abdomen with minimal ascites, mild right pleural effusion and soft tissue edema.  Labs with potassium of 2.7, magnesium of 1.8, TSH normal.  Creatinine with some improvement to 1.46.  Echocardiogram with EF of 45 to 50%, regional wall motion abnormalities and grade 1 diastolic dysfunction.  Calcified aortic valve with severe aortic stenosis.   3/8: Hemodynamically stable, good UOP but weight recorded is slight increase than before which does not make sense.  Significant hypokalemia and mild hypomagnesemia which are being repleted.  Per cardiology he might be a poor candidate for TAVR or SAVR but they will evaluate as outpatient.  Continue IV diuresis and replating electrolytes.  3/9: Hemodynamically stable, continuing diuresis with slowly improving creatinine.  Mild hypokalemia, hypophosphatemia and hypomagnesemia which is being repleted.  3/10: Remained hemodynamically stable, good urinary output with net negative of more than 20 L.  Slight increase in creatinine so IV Lasix is being held, we can start him on p.o. torsemide 40 mg from tomorrow. PT is recommending  SNF-now medically stable  3/11: Remained stable, improving creatinine after stopping IV diuresis.  Starting on p.o. torsemide 40 mg daily.  Awaiting SNF placement  3/12: UOP of more than 4 L recorded with torsemide 40 mg daily, slight increase in creatinine and decrease in sodium, decreasing torsemide to 20 mg daily.  Awaiting SNF placement.  3/13: Hemodynamically stable, slight worsening of renal function but patient did receive 40 mg of torsemide before decreasing to 20 yesterday. Had bed offer at [facility] authorization.  Assessment and Plan: * Acute on chronic combined systolic and diastolic CHF (congestive heart failure) (HCC) Echocardiogram with borderline low EF at 45 to 50%, grade 1 diastolic dysfunction and severe aortic stenosis. Prior echocardiogram done in 2022 with normal EF and moderate AS.  Elevated BNP at 935. Exertional dyspnea seems like a combination of CHF and aortic stenosis.  Patient diuresed very well with net negative of more than 24 L, initially IV Lasix followed by torsemide at 40 mg, creatinine started increasing again. -Decreasing torsemide to 20 mg daily -Daily weight and BMP -Strict intake and output -Cardiology is on board-appreciate their help  Aortic stenosis Severe aortic stenosis per echocardiogram done yesterday. Likely contributing to his symptoms of dyspnea. -Cardiology is on board -Might not be a good candidate for TAVR/SAVR but they will reevaluate as outpatient  Hypokalemia Mild hypokalemia which is being repleted. -Replete electrolytes and monitor  Acute urinary retention Patient has an history of BPH and was not taking any medication currently. Foley catheter was placed in ED.  BPH (benign prostatic hyperplasia) -Starting on Flomax  Essential hypertension Blood pressure currently within goal. Home amlodipine and clonidine is being  held due to worsening of CHF. Home labetalol was switched with Coreg Patient is  also being diuresed with IV Lasix Continue spironolactone  Paroxysmal atrial fibrillation (HCC) Currently in sinus rhythm. Home labetalol has been switched with Coreg Continue with Eliquis  CKD (chronic kidney disease), stage II Slight increase in creatinine with some improvement today, not meeting the criteria for AKI -Monitor renal function -Avoid nephrotoxins  Subjective: Patient was seen and examined today.  Continue to feel weak and no other complaints.  Physical Exam: Vitals:   01/20/24 1937 01/21/24 0434 01/21/24 0500 01/21/24 0916  BP: 109/75 108/64  105/69  Pulse: 73 75  77  Resp: 18 16  16   Temp: 98.2 F (36.8 C) 98.5 F (36.9 C)  98.2 F (36.8 C)  TempSrc:    Oral  SpO2: 95% 95%  95%  Weight:   102.4 kg   Height:       General.  Frail gentleman, in no acute distress. Pulmonary.  Lungs clear bilaterally, normal respiratory effort. CV.  Regular rate and rhythm, no JVD, rub or murmur. Abdomen.  Soft, nontender, nondistended, BS positive. CNS.  Alert and oriented .  No focal neurologic deficit. Extremities.  Trace LE edema, left leg with Ace wrap  Data Reviewed: Prior data reviewed  Family Communication: Discussed with wife at bedside  Disposition: Status is: Inpatient Remains inpatient appropriate because: Severity of illness  Planned Discharge Destination: Home  DVT Prophylaxis. Eliquis Time spent: 43 minutes  This record has been created using Conservation officer, historic buildings. Errors have been sought and corrected,but may not always be located. Such creation errors do not reflect on the standard of care.   Author: Arnetha Courser, MD 01/21/2024 12:44 PM  For on call review [email or web address].

## 2024-01-21 NOTE — Progress Notes (Signed)
 Occupational Therapy Treatment Patient Details Name: Mark Blevins MRN: [id number] DOB: 1941/07/02 Today's Date: 01/21/2024   History of present illness Pt is an 83 yo male that presented to the ED for LLE edema, dyspnea. PMH of PAF on Eliquis, HTN, chronic HFpEF, moderate aortic stenosis, CKD stage II.   OT comments  Pt seen for OT tx this date, spouse present in room. Supervision for bed mobility, requires minA to rise from EOB using RW, cues for hand placement and assist to power up. ModA for step pivot transfer using RW <> BSC, movements effortful and pt fatiguing quickly. MaxA for pericare in standing after large liquid BM on BSC. NT in room for CHG bath with pt assisting. Pt able to perform grooming tasks with supervision for seated balance. Continues to demonstrate deficits in activity tolerance, strength and balance and will benefit from skilled OT. Pt making progress towards goals, OT will continue to follow. Discharge recommendation remains appropriate.       If plan is discharge home, recommend the following:  A lot of help with walking and/or transfers;A lot of help with bathing/dressing/bathroom;Assistance with cooking/housework;Direct supervision/assist for medications management;Direct supervision/assist for financial management   Equipment Recommendations  Other (comment)       Precautions / Restrictions Precautions Precautions: Fall Recall of Precautions/Restrictions: Intact Restrictions Weight Bearing Restrictions Per Provider Order: No       Mobility Bed Mobility Overal bed mobility: Needs Assistance Bed Mobility: Supine to Sit, Sit to Supine     Supine to sit: Supervision, Used rails, HOB elevated Sit to supine: Supervision, Used rails, HOB elevated        Transfers Overall transfer level: Needs assistance Equipment used: Rolling walker (2 wheels) Transfers: Bed to chair/wheelchair/BSC, Sit to/from Stand Sit to Stand: Min assist     Step pivot  transfers: Mod assist     General transfer comment: minA from EOB, elevated bed, MOD A for step pivot transfer to [facility]     Balance Overall balance assessment: Needs assistance Sitting-balance support: Feet supported Sitting balance-Leahy Scale: Good Sitting balance - Comments: supervision for seated CHG bath with NT   Standing balance support: Bilateral upper extremity supported Standing balance-Leahy Scale: Poor Standing balance comment: limited tolerance to standing                           ADL either performed or assessed with clinical judgement   ADL Overall ADL's : Needs assistance/impaired     Grooming: Sitting;Set up;Wash/dry hands;Wash/dry face Grooming Details (indicate cue type and reason): supervision for seated balance EOB Upper Body Bathing: Sitting;Minimal assistance Upper Body Bathing Details (indicate cue type and reason): NT assisting with CHG bath (pt bathing arms) Lower Body Bathing: Minimal assistance Lower Body Bathing Details (indicate cue type and reason): CHG bath with NT, pt assisting         Toilet Transfer: Moderate assistance;BSC/3in1;Stand-pivot;Rolling walker (2 wheels) Toilet Transfer Details (indicate cue type and reason): transfer to [facility] for BM (RN notifed of large amounts of diarrhea, redness on buttocks, red scrotum with skin flakes) Toileting- Clothing Manipulation and Hygiene: Maximal assistance;Sit to/from stand Toileting - Clothing Manipulation Details (indicate cue type and reason): maxA for pericare in standing, heavy UE reliance on BSC     Functional mobility during ADLs: Moderate assistance;Rolling walker (2 wheels);Cueing for sequencing;Cueing for safety       Communication Communication Communication: No apparent difficulties   Cognition Arousal: Alert Behavior During Therapy: [facility]  for tasks assessed/performed                                 Following commands: Intact        Cueing   Cueing  Techniques: Verbal cues        General Comments (RN notifed of large amounts of diarrhea, redness on buttocks, red scrotum with skin flakes)    Pertinent Vitals/ Pain       Pain Assessment Pain Assessment: No/denies pain         Frequency  Min 2X/week        Progress Toward Goals  OT Goals(current goals can now be found in the care plan section)  Progress towards OT goals: Progressing toward goals  Acute Rehab OT Goals OT Goal Formulation: With patient Time For Goal Achievement: 02/02/24 Potential to Achieve Goals: Good ADL Goals Pt Will Perform Grooming: sitting;with modified independence Pt Will Perform Upper Body Bathing: with modified independence;sitting Pt Will Perform Lower Body Dressing: with supervision;sit to/from stand Pt Will Transfer to Toilet: with min assist;bedside commode Pt Will Perform Toileting - Clothing Manipulation and hygiene: with min assist;sit to/from stand  Plan         AM-PAC OT "6 Clicks" Daily Activity     Outcome Measure   Help from another person eating meals?: None Help from another person taking care of personal grooming?: None Help from another person toileting, which includes using toliet, bedpan, or urinal?: A Lot Help from another person bathing (including washing, rinsing, drying)?: A Lot Help from another person to put on and taking off regular upper body clothing?: A Lot Help from another person to put on and taking off regular lower body clothing?: A Lot 6 Click Score: 16    End of Session Equipment Utilized During Treatment: Rolling walker (2 wheels)  OT Visit Diagnosis: Muscle weakness (generalized) (M62.81);Other abnormalities of gait and mobility (R26.89);Unsteadiness on feet (R26.81);Pain   Activity Tolerance Patient tolerated treatment well   Patient Left in bed;with call bell/phone within reach;with bed alarm set;with family/visitor present;with nursing/sitter in room   Nurse Communication Mobility status  (BM)        Time: 1050-1120 OT Time Calculation (min): 30 min  Charges: OT General Charges $OT Visit: 1 Visit OT Treatments $Self Care/Home Management : 23-37 mins  Janat Tabbert L. Khayden Herzberg, OTR/L  01/21/24, 11:30 AM

## 2024-01-21 NOTE — TOC Progression Note (Addendum)
 Transition of Care [facility]) - Progression Note    Patient Details  Name: Mark Blevins MRN: [id number] Date of Birth: 26-Mar-1941  [facility] CM/SW Contact  Hetty Ely, RN Phone Number: 01/21/2024, 9:32 AM  Clinical Narrative:  Spoke with Son Mark Blevins about SNF bed offers, was informed to email list to [email or web address] and speak with patient about list to assist with decision. Patient has personal cell and able to call Son, while speaking with CM. 10:50 am: Spoke with patient and wife at bedside about bed offers, they both agrees to [facility]. Left message for LC to confirm bed offer.  11:30 am: LC confirmed bed offer, will have CMA to start Ins. Auth. Spoke with Son about LC bed offer decision, he was receptive.           Expected Discharge Plan and Services                                               Social Determinants of Health (SDOH) Interventions SDOH Screenings   Food Insecurity: No Food Insecurity (01/15/2024)  Housing: Unknown (01/15/2024)  Transportation Needs: No Transportation Needs (01/15/2024)  Utilities: Not At Risk (01/15/2024)  Alcohol Screen: Low Risk  (08/22/2021)  Depression (PHQ2-9): Low Risk  (01/13/2022)  Financial Resource Strain: Low Risk  (08/22/2021)  Physical Activity: Insufficiently Active (08/22/2021)  Social Connections: Socially Integrated (01/15/2024)  Stress: No Stress Concern Present (08/22/2021)  Tobacco Use: High Risk (01/14/2024)    Readmission Risk Interventions     No data to display

## 2024-01-21 NOTE — Progress Notes (Signed)
 Heart Failure Stewardship Pharmacy Note  PCP: Corky Downs, MD PCP-Cardiologist: None  HPI: Mark Blevins is a 83 y.o. male with PAF on Eliquis, HTN, chronic HFpEF, moderate aortic stenosis, CKD stage II who presented with worsening LEE and dyspnea on exertion over the last 6 months. On admission, BNP was 935.6, HS-troponin was 34 > 32, and TSH 2.858. Chest x-ray noted increased mild cardiomegaly with central vascular congestion, mild basilar interstitial edema and small pleural effusions consistent with CHF. [place] with minimal ascites.  Pertinent cardiac history: Echo in 05/2021 with normal EF and moderate AS. Negative stress test in 05/2021. Echo this admission with LVEF down to 45-50% with grade I diastolic dysfunction, and severe AS.   Pertinent Lab Values: Creat  Date Value Ref Range Status  08/19/2021 1.35 (H) 0.70 - 1.22 mg/dL Final   Creatinine, Ser  Date Value Ref Range Status  01/21/2024 1.35 (H) 0.61 - 1.24 mg/dL Final   BUN  Date Value Ref Range Status  01/21/2024 21 8 - 23 mg/dL Final   Potassium  Date Value Ref Range Status  01/21/2024 3.5 3.5 - 5.1 mmol/L Final   Sodium  Date Value Ref Range Status  01/21/2024 134 (L) 135 - 145 mmol/L Final   B Natriuretic Peptide  Date Value Ref Range Status  01/14/2024 935.6 (H) 0.0 - 100.0 pg/mL Final    Comment:    Performed at [facility], [address] [place], [place] [address]   Magnesium  Date Value Ref Range Status  01/20/2024 1.9 1.7 - 2.4 mg/dL Final    Comment:    Performed at [facility], [address] [place], [place] [address]   TSH  Date Value Ref Range Status  01/14/2024 2.858 0.350 - 4.500 uIU/mL Final    Comment:    Performed by a 3rd Generation assay with a functional sensitivity of <=0.01 uIU/mL. Performed at [facility], [address] [place], [place] [address]     Vital Signs: Admission weight: 251.55 lbs Temp:  [98.2 F (36.8 C)-98.5 F (36.9 C)] 98.2  F (36.8 C) (03/13 0916) Pulse Rate:  [71-77] 77 (03/13 0916) Cardiac Rhythm: Atrial fibrillation (03/13 0753) Resp:  [14-18] 16 (03/13 0916) BP: (104-109)/(64-75) 105/69 (03/13 0916) SpO2:  [95 %-100 %] 95 % (03/13 0916) Weight:  [102.4 kg (225 lb 12 oz)] 102.4 kg (225 lb 12 oz) (03/13 0500)  Intake/Output Summary (Last 24 hours) at 01/21/2024 1113 Last data filed at 01/21/2024 1058 Gross per 24 hour  Intake 120 ml  Output 3750 ml  Net -3630 ml    Current Heart Failure Medications:  Loop diuretic: torsemide 20 mg daily (received 40 mg yesterday) Beta-Blocker: carvedilol 3.125 mg BID ACEI/ARB/ARNI:none MRA: spironolactone 25 mg daily SGLT2i: none Other: none  Prior to admission Heart Failure Medications:  Loop diuretic: furosemide 20 mg daily Beta-Blocker: labetalol 200 mg daily ACEI/ARB/ARNI: none MRA: none SGLT2i: none Other: amlodipine 5 mg daily, clonidine 0.1 mg daily  Assessment: 1. Acute combined systolic and diastolic heart failure (LVEF 45-50%) with grade I diastolic dysfunction and severe AS, due to NICM. NYHA class III-IV symptoms.  -Symptoms: Symptoms are stable. Patient awaiting discharge planning. Patient is upset about his requirement for SNF. -Volume: Still making good urine output, creatinine is trending up today. Torsemide reduced from 40 mg daily yesterday to 20 mg daily starting today. Will monitor creatinine trend on lower torsemide dose.  -Hemodynamics: BP stable this morning. HR 60-70s.  -BB: Currently on carvedilol 3.125  mg BID. Agree with replacing PTA labetalol.  -ACEI/ARB/ARNI: Can consider starting ARB when BP is more stable, likely outpatient. This can help with medical management of AS if TAVR is not pursued. -MRA: Continue spironolactone 25 mg daily given hypokalemia. Potassium is improving today, still requires supplementation.  -SGLT2i: Not a candidate at this time due to urethral catheter. -Will need to stop clonidine, labetalol, and  amlodipine at discharge.  Plan: 1) Medication changes recommended at this time: -None.  2) Patient assistance: -Pending  3) Education: - Patient has been educated on current HF medications and potential additions to HF medication regimen - Patient verbalizes understanding that over the next few months, these medication doses may change and more medications may be added to optimize HF regimen - Patient has been educated on basic disease state pathophysiology and goals of therapy  Medication Assistance / Insurance Benefits Check: Does the patient have prescription insurance?    Type of insurance plan:  Does the patient qualify for medication assistance through manufacturers or grants? Pending   Outpatient Pharmacy: Prior to admission outpatient pharmacy: [facility]      Please do not hesitate to reach out with questions or concerns,  Enos Fling, PharmD, CPP, BCPS Heart Failure Pharmacist  Phone - [phone] 01/21/2024 11:13 AM

## 2024-01-21 NOTE — Progress Notes (Signed)
 Nutrition Follow-up  DOCUMENTATION CODES:   Obesity unspecified  INTERVENTION:   Magic cup TID with meals, each supplement provides 290 kcal and 9 grams of protein  Vital Cuisine po BID, each supplement provides 520kcal and 22g of protein.   MVI po daily   Vitamin C 250mg  po BID   Pt at refeed risk; recommend monitor potassium, magnesium and phosphorus labs daily until stable  Daily weights  NUTRITION DIAGNOSIS:   Increased nutrient needs related to chronic illness as evidenced by estimated needs. -ongoing   GOAL:   Patient will meet greater than or equal to 90% of their needs -progressing   MONITOR:   PO intake, Supplement acceptance, Labs, Weight trends, I & O's, Skin  ASSESSMENT:   83 y/o male with h/o recurrent sigmoid volvulus (s/p exploratory laparatomy, sigmoidectomy, decompressive sigmoidoscopy and end colostomy formation 2020 and s/p robotic assisted laparoscopic colostomy reversal 2021), SBO (s/p iagnostic laparoscopy, lysis of adhesions 2020), CHF, PAF, HTN, aortic stenosis, BPH, CKD II and lymphedema who is admitted with CHF.  Met with pt in room today. Pt reports poor appetite and oral intake in hospital. Pt's lunch tray was sitting on his side table with 30% eaten from it. Pt is documented to be eating mainly 50-100% of meals. Pt reports early satiety secondary to ascites. Pt reports that he does like the Magic Cups but declines all Boost and Ensure supplements as he reports they taste terrible. RD will add vital cuisine to meal trays. Will add vitamin C to support wound healing. Continue MVI. Pt remains at refeed risk. Per chart, pt appears to be down ~25lbs since admission and is now down ~29lbs from his last documented weight from 2/13. Pt is at high risk for developing malnutrition.   Medications reviewed and include: oscal, KCl, aldactone, torsemide  Labs reviewed: Na 124(L), K 3.5 wnl, creat 1.35(H), P 3.4 wnl  Nutrition Focused Physical  Exam:  Flowsheet Row Most Recent Value  Orbital Region Mild depletion  Upper Arm Region Moderate depletion  Thoracic and Lumbar Region No depletion  Buccal Region No depletion  Temple Region Mild depletion  Clavicle Bone Region Moderate depletion  Clavicle and Acromion Bone Region Moderate depletion  Scapular Bone Region No depletion  Dorsal Hand Mild depletion  Patellar Region Mild depletion  Anterior Thigh Region Mild depletion  Posterior Calf Region Mild depletion  Edema (RD Assessment) Mild  Hair Reviewed  Eyes Reviewed  Mouth Reviewed  Skin Reviewed  Nails Reviewed   Diet Order:   Diet Order             Diet 2 gram sodium Fluid consistency: Thin; Fluid restriction: 2000 mL Fluid  Diet effective now                  EDUCATION NEEDS:   Education needs have been addressed  Skin:  Skin Assessment: Skin Integrity Issues: Skin Integrity Issues:: Other (Comment) Other: 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  Estimated Nutritional Needs:   Kcal:  2000-2300kcal/day  Protein:  100-115g/day  Fluid:  2.0L/day  Betsey Holiday MS, RD, LDN If unable to be reached, please send secure chat to "RD inpatient"  available from 8:00a-4:00p daily

## 2024-01-21 NOTE — Progress Notes (Signed)
 PHARMACY CONSULT NOTE - ELECTROLYTES  Pharmacy Consult for Electrolyte Monitoring and Replacement   Recent Labs: Height: 6' (182.9 cm) Weight: 102.4 kg (225 lb 12 oz) IBW/kg (Calculated) : 77.6 Estimated Creatinine Clearance: 51.3 mL/min (A) (by C-G formula based on SCr of 1.35 mg/dL (H)). Potassium (mmol/L)  Date Value  01/21/2024 3.5   Magnesium (mg/dL)  Date Value  65/78/4696 1.9   Calcium (mg/dL)  Date Value  29/52/8413 8.2 (L)   Albumin (g/dL)  Date Value  24/40/1027 2.5 (L)   Phosphorus (mg/dL)  Date Value  25/36/6440 3.4   Sodium (mmol/L)  Date Value  01/21/2024 134 (L)   Corrected Ca: 8.7 mg/dL  Assessment  Mark Blevins is a 83 y.o. male presenting with acute on chronic HFpEF decompensation. PMH significant for PAF on Eliquis, HTN, chronic HFpEF, moderate aortic stenosis, CKD stage II. Pharmacy has been consulted to monitor and replace electrolytes.  Diet: low sodium(<2g daily) with thin fluids only(fluid restriction<2L) MIVF: None Pertinent medications: torsemide 40mg  po daily>> decreased to 20 mg daily on 3/13 Spironolactone 25 mg daily   Goal of Therapy: K>4 and Mag>2 given history of Afib  Plan:  K 3.5 again, Will replace with Kcl po BID x 2 doses for goal K>4  (note torsemide dose decreased today 01/21/24 from 40 mg to 20 mg po daily) On spironolactone 25 mg daily Check renal panel, Mag with AM labs  Thank you for allowing pharmacy to be a part of this patient's care.  Bari Mantis PharmD Clinical Pharmacist 01/21/2024

## 2024-01-22 DIAGNOSIS — R5381 Other malaise: Secondary | ICD-10-CM | POA: Insufficient documentation

## 2024-01-22 DIAGNOSIS — I5043 Acute on chronic combined systolic (congestive) and diastolic (congestive) heart failure: Secondary | ICD-10-CM | POA: Diagnosis not present

## 2024-01-22 LAB — RENAL FUNCTION PANEL
Albumin: 2.8 g/dL — ABNORMAL LOW (ref 3.5–5.0)
Anion gap: 14 (ref 5–15)
BUN: 23 mg/dL (ref 8–23)
CO2: 26 mmol/L (ref 22–32)
Calcium: 8.6 mg/dL — ABNORMAL LOW (ref 8.9–10.3)
Chloride: 93 mmol/L — ABNORMAL LOW (ref 98–111)
Creatinine, Ser: 1.42 mg/dL — ABNORMAL HIGH (ref 0.61–1.24)
GFR, Estimated: 49 mL/min — ABNORMAL LOW (ref >60)
Glucose, Bld: 92 mg/dL (ref 70–99)
Phosphorus: 3.6 mg/dL (ref 2.5–4.6)
Potassium: 3.7 mmol/L (ref 3.5–5.1)
Sodium: 133 mmol/L — ABNORMAL LOW (ref 135–145)

## 2024-01-22 LAB — MAGNESIUM: Magnesium: 2 mg/dL (ref 1.7–2.4)

## 2024-01-22 MED ORDER — CARVEDILOL 3.125 MG PO TABS: 3.1250 mg | ORAL_TABLET | Freq: Two times a day (BID) | ORAL | 0 refills | Status: DC

## 2024-01-22 MED ORDER — CALCIUM CARBONATE 1250 (500 CA) MG PO TABS: 1.0000 | ORAL_TABLET | Freq: Two times a day (BID) | ORAL | 0 refills | Status: AC

## 2024-01-22 MED ORDER — POTASSIUM CHLORIDE CRYS ER 20 MEQ PO TBCR: 20.0000 meq | EXTENDED_RELEASE_TABLET | Freq: Every day | ORAL | 0 refills | Status: DC

## 2024-01-22 MED ORDER — POTASSIUM CHLORIDE CRYS ER 20 MEQ PO TBCR
40.0000 meq | EXTENDED_RELEASE_TABLET | Freq: Two times a day (BID) | ORAL | Status: AC
Start: 2024-01-22 — End: 2024-01-22
  Administered 2024-01-22 (×2): 40 meq via ORAL
  Filled 2024-01-22 (×2): qty 2

## 2024-01-22 MED ORDER — TORSEMIDE 10 MG PO TABS: 10.0000 mg | ORAL_TABLET | Freq: Every day | ORAL | 0 refills | Status: DC

## 2024-01-22 MED ORDER — SPIRONOLACTONE 25 MG PO TABS: 25.0000 mg | ORAL_TABLET | Freq: Every day | ORAL | 0 refills | Status: DC

## 2024-01-22 MED ORDER — TAMSULOSIN HCL 0.4 MG PO CAPS: 0.4000 mg | ORAL_CAPSULE | Freq: Every day | ORAL | 0 refills | Status: DC

## 2024-01-22 MED ORDER — TORSEMIDE 20 MG PO TABS
10.0000 mg | ORAL_TABLET | Freq: Every day | ORAL | Status: DC
  Filled 2024-01-22: qty 1

## 2024-01-22 NOTE — Discharge Summary (Addendum)
 Physician Discharge Summary   Patient: Mark Blevins MRN: [id number] DOB: June 24, 1941  Admit date:     01/14/2024  Discharge date: 01/22/24  Discharge Physician: Fran Lowes   PCP: Corky Downs, MD   Recommendations at discharge:    Discharge to SNF. PT/OT Pt to have chemistry drawn on 01/25/2024 and reported to facility physician. Follow up with PCP in 7-10 days after discharge from facility. Follow up with cardiology within 2-4 weeks.  Discharge Diagnoses: Principal Problem:   Acute on chronic combined systolic and diastolic CHF (congestive heart failure) (HCC) Active Problems:   Aortic stenosis   Hypokalemia   Acute urinary retention   BPH (benign prostatic hyperplasia)   Essential hypertension   Paroxysmal atrial fibrillation (HCC)   CKD (chronic kidney disease), stage II   Debility  Resolved Problems:   * No resolved hospital problems. [facility] Course: Taken from H&P.  Mark Blevins is a 83 y.o. male with medical history significant of PAF on Eliquis, HTN, chronic HFpEF, moderate aortic stenosis, CKD stage II presented with worsening of leg swelling and exertional dyspnea, over the.  Of last 50-month, gradually worsening.  Recently noticed significantly worsening of swelling and abdominal girth.  On presentation hemodynamically stable, on room air.  Chest x-ray with mild pulmonary congestion.  Labs with potassium of 3, BUN 19, creatinine 1.5 with baseline of 1.2-1.3.  BUN 935, troponin 34>>32  Patient was admitted for IV diuresis.  Echocardiogram ordered.  Cardiology was consulted.  3/7: Vital stable, ultrasound abdomen with minimal ascites, mild right pleural effusion and soft tissue edema.  Labs with potassium of 2.7, magnesium of 1.8, TSH normal.  Creatinine with some improvement to 1.46.  Echocardiogram with EF of 45 to 50%, regional wall motion abnormalities and grade 1 diastolic dysfunction.  Calcified aortic valve with severe aortic stenosis.   3/8:  Hemodynamically stable, good UOP but weight recorded is slight increase than before which does not make sense.  Significant hypokalemia and mild hypomagnesemia which are being repleted.  Per cardiology he might be a poor candidate for TAVR or SAVR but they will evaluate as outpatient.  Continue IV diuresis and replating electrolytes.  3/9: Hemodynamically stable, continuing diuresis with slowly improving creatinine.  Mild hypokalemia, hypophosphatemia and hypomagnesemia which is being repleted.  3/10: Remained hemodynamically stable, good urinary output with net negative of more than 20 L.  Slight increase in creatinine so IV Lasix is being held, we can start him on p.o. torsemide 40 mg from tomorrow. PT is recommending SNF-now medically stable  3/11: Remained stable, improving creatinine after stopping IV diuresis.  Starting on p.o. torsemide 40 mg daily.  Awaiting SNF placement  3/12: UOP of more than 4 L recorded with torsemide 40 mg daily, slight increase in creatinine and decrease in sodium, decreasing torsemide to 20 mg daily.  Awaiting SNF placement.  3/13: Hemodynamically stable, slight worsening of renal function but patient did receive 40 mg of torsemide before decreasing to 20 yesterday. Had bed offer at [facility] authorization.  3/14: The patient is resting comfortably. Creatinine up slightly despite reduction in dose of torsemide. Will decrease dose again for discharge along with decrease of potassium dosing by 1/2. The patient will be discharged in good condition to SNF on 01/22/2024.  Assessment and Plan: * Acute on chronic combined systolic and diastolic CHF (congestive heart failure) (HCC) Echocardiogram with borderline low EF at 45 to 50%, grade 1 diastolic dysfunction and severe aortic stenosis. Prior echocardiogram done  in 2022 with normal EF and moderate AS.  Elevated BNP at 935. Exertional dyspnea seems like a combination of CHF and aortic stenosis.   Patient diuresed very well with net negative of more than 24 L, initially IV Lasix followed by torsemide at 40 mg, creatinine started increasing again. -Decreasing torsemide to 10 mg daily due to increasing creatinine -Potassium dose is also decreased by 1/2. -Daily weight and BMP -Strict intake and output -Cardiology is on board-appreciate their help. Pt will follow up with cardiology as outpatient in 2/4 weeks.  Aortic stenosis Severe aortic stenosis per echocardiogram done yesterday. Likely contributing to his symptoms of dyspnea. -Cardiology is on board -Might not be a good candidate for TAVR/SAVR but they will reevaluate as outpatient -Pt is to follow up with cardiology as outpatient.  Hypokalemia Resolved. Will decrease potassium to 20 mEq daily due to decrease in dose of torsemide. Chemistry to be drawn and reported to facility physician on 01/25/2024.  Acute urinary retention Patient has an history of BPH and was not taking any medication currently. Foley catheter was placed in ED. Will remove prior to discharge.  BPH (benign prostatic hyperplasia) -Continue Flomax  Essential hypertension Blood pressure on the low side today prior to administration of torsemide and spironolactone. Will hold these this morning.  Home amlodipine and clonidine have been discontinued. Home labetalol was switched with Coreg Patient was switched to torsemide. Dose has been reduced to 10 mg daily for now. Spironolactone to be continued at discharge.   Paroxysmal atrial fibrillation (HCC) Currently in sinus rhythm. Home labetalol has been switched with Coreg Continue with Eliquis  CKD (chronic kidney disease), stage II Slight increase in creatinine with some improvement today, not meeting the criteria for AKI -Monitor renal function -Avoid nephrotoxins  Debility The patient is complaining of severe weakness and fatigue. He is unable to sit up for entire [facility]ena[facility] Niece ride to SNF and will require an  ambulance.         Consultants: Cardiology Wound care Procedures performed: None  Disposition: Skilled nursing facility Diet recommendation:  Discharge Diet Orders (From admission, onward)     Start     Ordered   01/22/24 0000  Diet - low sodium heart healthy        01/22/24 1025           Cardiac diet DISCHARGE MEDICATION: Allergies as of 01/22/2024       Reactions   Codeine Nausea And Vomiting   Morphine And Codeine Nausea And Vomiting        Medication List     STOP taking these medications    ALPRAZolam 0.5 MG tablet Commonly known as: XANAX   amLODipine 5 MG tablet Commonly known as: NORVASC   cloNIDine 0.1 MG tablet Commonly known as: CATAPRES   furosemide 20 MG tablet Commonly known as: LASIX   labetalol 200 MG tablet Commonly known as: NORMODYNE   multivitamin with minerals Tabs tablet   potassium chloride 10 MEQ tablet Commonly known as: KLOR-CON   silver sulfADIAZINE 1 % cream Commonly known as: SILVADENE       TAKE these medications    apixaban 5 MG Tabs tablet Commonly known as: Eliquis Take 1 tablet (5 mg total) by mouth 2 (two) times daily.   calcium carbonate 1250 (500 Ca) MG tablet Commonly known as: OS-CAL - dosed in mg of elemental calcium Take 1 tablet (1,250 mg total) by mouth 2 (two) times daily with a meal.   carvedilol 3.125 MG  tablet Commonly known as: COREG Take 1 tablet (3.125 mg total) by mouth 2 (two) times daily with a meal.   potassium chloride SA 20 MEQ tablet Commonly known as: KLOR-CON M Take 1 tablet (20 mEq total) by mouth daily.   spironolactone 25 MG tablet Commonly known as: ALDACTONE Take 1 tablet (25 mg total) by mouth daily.   tamsulosin 0.4 MG Caps capsule Commonly known as: FLOMAX Take 1 capsule (0.4 mg total) by mouth daily after supper.   torsemide 10 MG tablet Commonly known as: DEMADEX Take 1 tablet (10 mg total) by mouth daily.               Discharge Care  Instructions  (From admission, onward)           Start     Ordered   01/22/24 0000  Discharge wound care:       Comments: Cleanse left lower leg with NS, apply Xeroform gauze to wound bed daily, cover with ABD pad and wrap leg with Kerlix roll gauze beginning right above toes and ending right below knee.  Secure entire dressing with Ace bandage wrapped in same fashion as Kerlix.   01/22/24 1025            Contact information for follow-up providers     Gerlene Fee, PA-C Follow up on 01/26/2024.   Specialty: Cardiology Why: Go at 3:00pm you will see Dr. Clarnce Flock. Contact information: [address] [place] [address] [phone]              Contact information for after-discharge care     Destination     [facility] Preferred SNF .   Service: Skilled Nursing Contact information: [address] [place] [address] [phone]                    Discharge Exam: Mark Blevins Weights   01/20/24 0438 01/21/24 0500 01/22/24 0500  Weight: 103.4 kg 102.4 kg 100.4 kg   Exam:  Constitutional:  The patient is awake, alert, and oriented x 3. No acute distress. Respiratory:  No increased work of breathing. No wheezes, rales, or rhonchi No tactile fremitus Cardiovascular:  Regular rate and rhythm No murmurs, ectopy, or gallups. No lateral PMI. No thrills. Abdomen:  Abdomen is soft, non-tender, non-distended No hernias, masses, or organomegaly Normoactive bowel sounds.  Musculoskeletal:  No cyanosis, clubbing, or edema Skin:  No rashes, lesions, ulcers palpation of skin: no induration or nodules Neurologic:  CN 2-12 intact Sensation all 4 extremities intact Psychiatric:  Mental status Mood, affect appropriate Orientation to person, place, time  judgment and insight appear intact   Condition at discharge: good  The results of significant  diagnostics from this hospitalization (including imaging, microbiology, ancillary and laboratory) are listed below for reference.   Imaging Studies: DG Chest 1 View Result Date: 01/15/2024 CLINICAL DATA:  83 year old male with history of congestive heart failure. EXAM: CHEST  1 VIEW COMPARISON:  Chest x-ray 01/14/2024. FINDINGS: Lung volumes are low. No confluent consolidative airspace disease. No pleural effusions. No pneumothorax. No evidence of pulmonary edema. Heart size is mildly enlarged. The patient is rotated to the right on today's exam, resulting in distortion of the mediastinal contours and reduced diagnostic sensitivity and specificity for mediastinal pathology. IMPRESSION: 1. Low lung volumes without radiographic evidence of acute cardiopulmonary disease. 2. Mild cardiomegaly. Electronically Signed   By: Brayton Mars.D.  On: 01/15/2024 07:22   ECHOCARDIOGRAM COMPLETE Result Date: 01/14/2024    ECHOCARDIOGRAM REPORT   Patient Name:   DEEPAK BLESS Date of Exam: 01/14/2024 Medical Rec #:  [id number]     Height:       72.0 in Accession #:    [id number]    Weight:       250.0 lb Date of Birth:  05-09-1941     BSA:          2.343 m Patient Age:    82 years      BP:           146/69 mmHg Patient Gender: M             HR:           68 bpm. Exam Location:  [facility] Procedure: 2D Echo, Cardiac Doppler and Color Doppler (Both Spectral and Color            Flow Doppler were utilized during procedure). Indications:     CHF-acute diastolic I50.31  History:         Patient has no prior history of Echocardiogram examinations.                  Arrythmias:Atrial Fibrillation; Risk Factors:Hypertension.  Sonographer:     Cristela Blue Referring Phys:  [id number] CARALYN HUDSON Diagnosing Phys: Alwyn Pea MD IMPRESSIONS  1. Left ventricular ejection fraction, by estimation, is 45 to 50%. The left ventricle has normal function. The left ventricle demonstrates regional wall motion abnormalities (see scoring  diagram/findings for description). There is moderate concentric left ventricular hypertrophy. Left ventricular diastolic parameters are consistent with Grade I diastolic dysfunction (impaired relaxation).  2. Right ventricular systolic function is normal. The right ventricular size is normal.  3. The mitral valve is normal in structure. No evidence of mitral valve regurgitation.  4. The aortic valve is calcified. Aortic valve regurgitation is mild. Severe aortic valve stenosis. FINDINGS  Left Ventricle: Ant/apical/setal hypo. Left ventricular ejection fraction, by estimation, is 45 to 50%. The left ventricle has normal function. The left ventricle demonstrates regional wall motion abnormalities. Strain was performed and the global longitudinal strain is indeterminate. The left ventricular internal cavity size was normal in size. There is moderate concentric left ventricular hypertrophy. Left ventricular diastolic parameters are consistent with Grade I diastolic dysfunction (impaired relaxation). Right Ventricle: The right ventricular size is normal. No increase in right ventricular wall thickness. Right ventricular systolic function is normal. Left Atrium: Left atrial size was normal in size. Right Atrium: Right atrial size was normal in size. Pericardium: There is no evidence of pericardial effusion. Mitral Valve: The mitral valve is normal in structure. No evidence of mitral valve regurgitation. MV peak gradient, 10.4 mmHg. The mean mitral valve gradient is 5.0 mmHg. Tricuspid Valve: The tricuspid valve is normal in structure. Tricuspid valve regurgitation is mild. Aortic Valve: The aortic valve is calcified. Aortic valve regurgitation is mild. Severe aortic stenosis is present. Aortic valve mean gradient measures 29.8 mmHg. Aortic valve peak gradient measures 53.7 mmHg. Aortic valve area, by VTI measures 0.58 cm. Pulmonic Valve: The pulmonic valve was normal in structure. Pulmonic valve regurgitation is not  visualized. Aorta: The ascending aorta was not well visualized. IAS/Shunts: No atrial level shunt detected by color flow Doppler. Additional Comments: 3D was performed not requiring image post processing on an independent workstation and was indeterminate.  LEFT VENTRICLE PLAX 2D LVIDd:         4.70  cm LVIDs:         3.30 cm LV PW:         1.70 cm LV IVS:        1.50 cm LVOT diam:     2.00 cm LV SV:         48 LV SV Index:   21 LVOT Area:     3.14 cm  RIGHT VENTRICLE RV Basal diam:  4.50 cm RV Mid diam:    3.50 cm RV S prime:     9.03 cm/s TAPSE (M-mode): 2.2 cm LEFT ATRIUM              Index        RIGHT ATRIUM           Index LA diam:        3.40 cm  1.45 cm/m   RA Area:     23.60 cm LA Vol (A2C):   104.0 ml 44.40 ml/m  RA Volume:   72.00 ml  30.74 ml/m LA Vol (A4C):   134.0 ml 57.20 ml/m LA Biplane Vol: 121.0 ml 51.65 ml/m  AORTIC VALVE AV Area (Vmax):    0.51 cm AV Area (Vmean):   0.52 cm AV Area (VTI):     0.58 cm AV Vmax:           366.25 cm/s AV Vmean:          252.000 cm/s AV VTI:            0.829 m AV Peak Grad:      53.7 mmHg AV Mean Grad:      29.8 mmHg LVOT Vmax:         59.10 cm/s LVOT Vmean:        41.800 cm/s LVOT VTI:          0.153 m LVOT/AV VTI ratio: 0.18  AORTA Ao Root diam: 3.30 cm MITRAL VALVE                TRICUSPID VALVE MV Area (PHT): 5.58 cm     TR Peak grad:   44.1 mmHg MV Area VTI:   1.55 cm     TR Vmax:        332.00 cm/s MV Peak grad:  10.4 mmHg MV Mean grad:  5.0 mmHg     SHUNTS MV Vmax:       1.61 m/s     Systemic VTI:  0.15 m MV Vmean:      99.2 cm/s    Systemic Diam: 2.00 cm MV Decel Time: 136 msec MV E velocity: 156.00 cm/s Dwayne Salome Arnt MD Electronically signed by Alwyn Pea MD Signature Date/Time: 01/14/2024/4:35:16 PM    Final    [place] ASCITES (ABDOMEN LIMITED) Result Date: 01/14/2024 CLINICAL DATA:  Ascites EXAM: LIMITED ABDOMEN ULTRASOUND FOR ASCITES TECHNIQUE: Limited ultrasound survey for ascites was performed in all four abdominal quadrants. COMPARISON:   Abdomen pelvis CT 09/05/2021 FINDINGS: Minimal ascites identified in the 4 quadrants. Fatty liver infiltration. Note is made of a right pleural effusion. Edematous soft tissues. IMPRESSION: Minimal ascites suggested. Soft tissue edema. Right pleural effusion Electronically Signed   By: Karen Kays M.D.   On: 01/14/2024 14:09   DG Chest 2 View Result Date: 01/14/2024 CLINICAL DATA:  Shortness of breath. EXAM: CHEST - 2 VIEW COMPARISON:  PA Lat chest 01/09/2020 FINDINGS: There is increased mild cardiomegaly. Interval new central vascular congestion, with mild basilar interstitial edema is noted and small pleural  effusions are forming. No focal airspace disease is seen. The mediastinum is normally outlined. There is calcification of the transverse aorta. No new osseous findings. Multilevel thoracic spine bridging enthesopathy and slight dextroscoliosis are again shown. IMPRESSION: 1. Increased mild cardiomegaly with central vascular congestion, mild basilar interstitial edema and small pleural effusions. Findings are consistent with CHF. 2. Aortic atherosclerosis. Electronically Signed   By: Almira Bar M.D.   On: 01/14/2024 07:39    Microbiology: Results for orders placed or performed during the hospital encounter of 02/01/20  SARS CORONAVIRUS 2 (TAT 6-24 HRS) Nasopharyngeal Nasopharyngeal Swab     Status: None   Collection Time: 02/01/20  8:41 AM   Specimen: Nasopharyngeal Swab  Result Value Ref Range Status   SARS Coronavirus 2 NEGATIVE NEGATIVE Final    Comment: (NOTE) SARS-CoV-2 target nucleic acids are NOT DETECTED. The SARS-CoV-2 RNA is generally detectable in upper and lower respiratory specimens during the acute phase of infection. Negative results do not preclude SARS-CoV-2 infection, do not rule out co-infections with other pathogens, and should not be used as the sole basis for treatment or other patient management decisions. Negative results must be combined with clinical  observations, patient history, and epidemiological information. The expected result is Negative. Fact Sheet for Patients: HairSlick.no Fact Sheet for Healthcare Providers: quierodirigir.com This test is not yet approved or cleared by the [place] FDA and  has been authorized for detection and/or diagnosis of SARS-CoV-2 by FDA under an Emergency Use Authorization (EUA). This EUA will remain  in effect (meaning this test can be used) for the duration of the COVID-19 declaration under Section 56 4(b)(1) of the Act, 21 U.S.C. section 360bbb-3(b)(1), unless the authorization is terminated or revoked sooner. Performed at [facility], [address], [place], [place] [address]     Labs: CBC: Recent Labs  Lab 01/21/24 0454  WBC 4.7  HGB 11.3*  HCT 32.8*  MCV 91.9  PLT 179   Basic Metabolic Panel: Recent Labs  Lab 01/16/24 0515 01/16/24 1302 01/17/24 0459 01/18/24 0434 01/19/24 0429 01/20/24 0426 01/20/24 0428 01/21/24 0454 01/22/24 0421  NA 136  --  134* 133* 131*  --  133* 134* 133*  K 2.6*   < > 3.2* 3.0* 3.1*  --  3.5 3.5 3.7  CL 98  --  95* 93* 93*  --  93* 95* 93*  CO2 29  --  30 30 28   --  29 29 26   GLUCOSE 100*  --  128* 116* 112*  --  89 99 92  BUN 16  --  15 16 17   --  19 21 23   CREATININE 1.29*  --  1.15 1.26* 1.18  --  1.22 1.35* 1.42*  CALCIUM 7.7*  --  7.6* 7.6* 8.1*  --  8.1* 8.2* 8.6*  MG 1.8  --  1.7 1.9  --  1.9  --   --  2.0  PHOS  --   --  2.3* 3.3 3.4  --   --  3.4 3.6   < > = values in this interval not displayed.   Liver Function Tests: Recent Labs  Lab 01/19/24 0429 01/21/24 0454 01/22/24 0421  ALBUMIN 2.6* 2.5* 2.8*   CBG: No results for input(s): "GLUCAP" in the last 168 hours.  Discharge time spent: greater than 30 minutes.  Signed: Banessa Mao, DO [facility] 01/22/2024

## 2024-01-22 NOTE — TOC Transition Note (Addendum)
 Transition of Care [facility]) - Discharge Note   Patient Details  Name: Mark Blevins MRN: [id number] Date of Birth: [birth year]  Transition of Care [facility]) CM/SW Contact:  Liliana Cline, LCSW Phone Number: 01/22/2024, 10:22 AM   Clinical Narrative:    Discharge to [facility] today. Room 610. Confirmed with Admissions Worker Tobi Bastos. Updated MD, RN, and patient and wife at bedside. Asked RN to call report. EMS paperwork completed. Will call for EMS transport when patient is ready.  1:54- [facility] transport arranged, patient is 3rd on the list.  3:50- Patient has not voided, so cannot DC yet. CSW updated [facility] - Tobi Bastos at [employer] patient can come any time before midnight, otherwise it would be Monday. Called [facility], Isabelle Course states the latest they can transport patient is 6:30 pm.  Updated RN and MD.   Final next level of care: Skilled Nursing Facility Barriers to Discharge: Barriers Resolved   Patient Goals and CMS Choice            Discharge Placement              Patient chooses bed at: [facility]     Patient and family notified of of transfer: 01/22/24  Discharge Plan and Services Additional resources added to the After Visit Summary for                                       Social Drivers of Health (SDOH) Interventions SDOH Screenings   Food Insecurity: No Food Insecurity (01/15/2024)  Housing: Unknown (01/15/2024)  Transportation Needs: No Transportation Needs (01/15/2024)  Utilities: Not At Risk (01/15/2024)  Alcohol Screen: Low Risk  (08/22/2021)  Depression (PHQ2-9): Low Risk  (01/13/2022)  Financial Resource Strain: Low Risk  (08/22/2021)  Physical Activity: Insufficiently Active (08/22/2021)  Social Connections: Socially Integrated (01/15/2024)  Stress: No Stress Concern Present (08/22/2021)  Tobacco Use: High Risk (01/14/2024)     Readmission Risk Interventions     No data to display

## 2024-01-22 NOTE — Progress Notes (Signed)
 Approximately 1930-- Pt left with EMS for [facility]. All PIVs removed, sites WDL. Pt transported with all belongings. This RN attempted to call [facility] to provide report on pt. This RN waited on hold for approximately 10 minutes, but received no answer. AVS Discharge instructions sent with patient. Consulting civil engineer notified.

## 2024-01-22 NOTE — Assessment & Plan Note (Signed)
 Currently in sinus rhythm. Home labetalol has been switched with Coreg Continue with Eliquis

## 2024-01-22 NOTE — Assessment & Plan Note (Signed)
 The patient is complaining of severe weakness and fatigue. He is unable to sit up for entire [facility]enaida Niece ride to SNF and will require an ambulance.

## 2024-01-22 NOTE — Progress Notes (Signed)
 Occupational Therapy Treatment Patient Details Name: Mark Blevins MRN: [id number] DOB: 1941-07-06 Today's Date: 01/22/2024   History of present illness Pt is an 83 yo male that presented to the ED for LLE edema, dyspnea. PMH of PAF on Eliquis, HTN, chronic HFpEF, moderate aortic stenosis, CKD stage II.   OT comments  Pt seen for skilled OT tx this date, focusing on functional transfers and grooming tasks seated in recliner. Pt improving with mobility overall, requiring less physical assist vs previous sessions with this author. Pt potentially orthostatic, reports feeling "funny" after transfer with vitals assessed. Bed mobility with CGA, pt benefits from skilled cuing for task segmentation to improve performance with less physical assist from therapist. Pt able to stand from slightly elevated bed with minA, cues for hand placement to not pull from RW. Step pivot transfer with CGA, and controls descent to seated with min cuing.  BP as assessed by student RN 90/51 (62), 85/58 (68) after transfer. RN in room to assess BP manually as future readings on dinamap potentially inaccurate. Pt left in recliner, RN, NT and student RN in room. OT will continue to follow for functional gains. Discharge recommendation appropriate.       If plan is discharge home, recommend the following:  A lot of help with walking and/or transfers;A lot of help with bathing/dressing/bathroom;Assistance with cooking/housework;Direct supervision/assist for medications management;Direct supervision/assist for financial management   Equipment Recommendations  Other (comment)       Precautions / Restrictions Precautions Precautions: Fall Recall of Precautions/Restrictions: Intact Restrictions Weight Bearing Restrictions Per Provider Order: No       Mobility Bed Mobility Overal bed mobility: Needs Assistance Bed Mobility: Supine to Sit     Supine to sit: Contact guard, Used rails, HOB elevated     General bed  mobility comments: increased time and effort, reliant on bed features and verbal cues for sequencingt    Transfers Overall transfer level: Needs assistance Equipment used: Rolling walker (2 wheels) Transfers: Sit to/from Stand, Bed to chair/wheelchair/BSC Sit to Stand: Min assist     Step pivot transfers: Contact guard assist     General transfer comment: minA to rise from EOB, cues for hand placement, pt awaiting therapist instructions prior to standing using RW. Once standing, able to step pivot with CGA and RW. Cues to reach back for recliner. Attempted second attempt on sit<>stand, pt reporting feeling "funny", returns to seated for BP assessment     Balance Overall balance assessment: Needs assistance Sitting-balance support: Feet supported Sitting balance-Leahy Scale: Good     Standing balance support: Bilateral upper extremity supported, Reliant on assistive device for balance, During functional activity Standing balance-Leahy Scale: Poor                             ADL either performed or assessed with clinical judgement   ADL Overall ADL's : Needs assistance/impaired     Grooming: Sitting;Set up;Wash/dry hands;Wash/dry face Grooming Details (indicate cue type and reason): in recliner                             Functional mobility during ADLs: Minimal assistance;Rolling walker (2 wheels);Cueing for sequencing;Cueing for safety       Communication Communication Communication: No apparent difficulties Factors Affecting Communication: Hearing impaired   Cognition Arousal: Alert Behavior During Therapy: [facility] for tasks assessed/performed  Following commands: Intact        Cueing   Cueing Techniques: Verbal cues        General Comments 85/58 (68) seated in recliner after transfer with pt symptomatic "feeling funny", assessed after several minutes in recliner on L wrist. Assesed again with  varied abnormal readings likely not reading correctly - called RN in room to manually assess    Pertinent Vitals/ Pain       Pain Assessment Pain Assessment: No/denies pain         Frequency  Min 2X/week        Progress Toward Goals  OT Goals(current goals can now be found in the care plan section)  Progress towards OT goals: Progressing toward goals  Acute Rehab OT Goals OT Goal Formulation: With patient Time For Goal Achievement: 02/02/24 Potential to Achieve Goals: Good ADL Goals Pt Will Perform Grooming: sitting;with modified independence Pt Will Perform Upper Body Bathing: with modified independence;sitting Pt Will Perform Lower Body Dressing: with supervision;sit to/from stand Pt Will Transfer to Toilet: with min assist;bedside commode Pt Will Perform Toileting - Clothing Manipulation and hygiene: with min assist;sit to/from stand  Plan      Co-evaluation                 AM-PAC OT "6 Clicks" Daily Activity     Outcome Measure   Help from another person eating meals?: None Help from another person taking care of personal grooming?: None Help from another person toileting, which includes using toliet, bedpan, or urinal?: A Lot Help from another person bathing (including washing, rinsing, drying)?: A Lot Help from another person to put on and taking off regular upper body clothing?: A Lot Help from another person to put on and taking off regular lower body clothing?: A Lot 6 Click Score: 16    End of Session Equipment Utilized During Treatment: Rolling walker (2 wheels)  OT Visit Diagnosis: Muscle weakness (generalized) (M62.81);Other abnormalities of gait and mobility (R26.89);Unsteadiness on feet (R26.81);Pain   Activity Tolerance Patient tolerated treatment well   Patient Left in chair;with call bell/phone within reach;with chair alarm set;with nursing/sitter in room (NT, student RN, and RN in room)   Nurse Communication Mobility status         Time: [phone] OT Time Calculation (min): 34 min  Charges: OT General Charges $OT Visit: 1 Visit OT Treatments $Self Care/Home Management : 8-22 mins $Therapeutic Activity: 8-22 mins  Mahayla Haddaway L. Alekai Pocock, OTR/L  01/22/24, 11:23 AM

## 2024-01-22 NOTE — Assessment & Plan Note (Signed)
 -  Continue Flomax

## 2024-01-22 NOTE — Assessment & Plan Note (Signed)
 Slight increase in creatinine with some improvement today, not meeting the criteria for AKI -Monitor renal function -Avoid nephrotoxins

## 2024-01-22 NOTE — Plan of Care (Signed)

## 2024-01-22 NOTE — Progress Notes (Signed)
 PHARMACY CONSULT NOTE - ELECTROLYTES  Pharmacy Consult for Electrolyte Monitoring and Replacement   Recent Labs: Height: 6' (182.9 cm) Weight: 100.4 kg (221 lb 5.5 oz) IBW/kg (Calculated) : 77.6 Estimated Creatinine Clearance: 48.3 mL/min (A) (by C-G formula based on SCr of 1.42 mg/dL (H)). Potassium (mmol/L)  Date Value  01/22/2024 3.7   Magnesium (mg/dL)  Date Value  16/08/9603 2.0   Calcium (mg/dL)  Date Value  54/07/8118 8.6 (L)   Albumin (g/dL)  Date Value  14/78/2956 2.8 (L)   Phosphorus (mg/dL)  Date Value  21/30/8657 3.6   Sodium (mmol/L)  Date Value  01/22/2024 133 (L)   Corrected Ca: 8.7 mg/dL  Assessment  Mark Blevins is a 83 y.o. male presenting with acute on chronic HFpEF decompensation. PMH significant for PAF on Eliquis, HTN, chronic HFpEF, moderate aortic stenosis, CKD stage II. Pharmacy has been consulted to monitor and replace electrolytes.  Diet: low sodium(<2g daily) with thin fluids only(fluid restriction<2L) MIVF: None Pertinent medications:  torsemide 40mg  po daily>> decreased to 20 mg daily on 3/13 Spironolactone 25 mg daily   Goal of Therapy: K>4 and Mag>2 given history of Afib  Plan:  K 3.5 >> 3.7 Will order Kcl po BID x 2 doses for goal K>4 with Afib).  On torsemide 20 mg po daily.   On spironolactone 25 mg daily Check renal panel, Mag with AM labs  Thank you for allowing pharmacy to be a part of this patient's care.  Bari Mantis PharmD Clinical Pharmacist 01/22/2024

## 2024-01-22 NOTE — TOC Progression Note (Signed)
 Transition of Care [facility]) - Progression Note    Patient Details  Name: Mark Blevins MRN: [id number] Date of Birth: [birth year]  Transition of Care [facility]) CM/SW Contact  Liliana Cline, LCSW Phone Number: 01/22/2024, 9:24 AM  Clinical Narrative:    [facility] approved. CSW reached out to [employer], Tobi Bastos in Admissions states she is checking bed availability for today.        Expected Discharge Plan and Services                                               Social Determinants of Health (SDOH) Interventions SDOH Screenings   Food Insecurity: No Food Insecurity (01/15/2024)  Housing: Unknown (01/15/2024)  Transportation Needs: No Transportation Needs (01/15/2024)  Utilities: Not At Risk (01/15/2024)  Alcohol Screen: Low Risk  (08/22/2021)  Depression (PHQ2-9): Low Risk  (01/13/2022)  Financial Resource Strain: Low Risk  (08/22/2021)  Physical Activity: Insufficiently Active (08/22/2021)  Social Connections: Socially Integrated (01/15/2024)  Stress: No Stress Concern Present (08/22/2021)  Tobacco Use: High Risk (01/14/2024)    Readmission Risk Interventions     No data to display

## 2024-03-22 ENCOUNTER — Inpatient Hospital Stay
Admission: EM | Admit: 2024-03-22 | Discharge: 2024-03-30 | DRG: 312 | Disposition: A | Discharge status: Home Health | Attending: Internal Medicine | Admitting: Internal Medicine

## 2024-03-22 ENCOUNTER — Other Ambulatory Visit: Payer: Self-pay

## 2024-03-22 ENCOUNTER — Encounter: Payer: Self-pay | Admitting: Emergency Medicine

## 2024-03-22 ENCOUNTER — Emergency Department

## 2024-03-22 DIAGNOSIS — R42 Dizziness and giddiness: Secondary | ICD-10-CM

## 2024-03-22 DIAGNOSIS — T502X5A Adverse effect of carbonic-anhydrase inhibitors, benzothiadiazides and other diuretics, initial encounter: Secondary | ICD-10-CM | POA: Diagnosis present

## 2024-03-22 DIAGNOSIS — Z96653 Presence of artificial knee joint, bilateral: Secondary | ICD-10-CM | POA: Diagnosis present

## 2024-03-22 DIAGNOSIS — I5042 Chronic combined systolic (congestive) and diastolic (congestive) heart failure: Secondary | ICD-10-CM | POA: Diagnosis present

## 2024-03-22 DIAGNOSIS — I130 Hypertensive heart and chronic kidney disease with heart failure and stage 1 through stage 4 chronic kidney disease, or unspecified chronic kidney disease: Secondary | ICD-10-CM | POA: Diagnosis present

## 2024-03-22 DIAGNOSIS — D696 Thrombocytopenia, unspecified: Secondary | ICD-10-CM | POA: Diagnosis present

## 2024-03-22 DIAGNOSIS — I428 Other cardiomyopathies: Secondary | ICD-10-CM | POA: Diagnosis present

## 2024-03-22 DIAGNOSIS — R296 Repeated falls: Secondary | ICD-10-CM | POA: Diagnosis not present

## 2024-03-22 DIAGNOSIS — R140 Abdominal distension (gaseous): Secondary | ICD-10-CM | POA: Diagnosis present

## 2024-03-22 DIAGNOSIS — I350 Nonrheumatic aortic (valve) stenosis: Secondary | ICD-10-CM | POA: Diagnosis present

## 2024-03-22 DIAGNOSIS — Z7901 Long term (current) use of anticoagulants: Secondary | ICD-10-CM

## 2024-03-22 DIAGNOSIS — Z9049 Acquired absence of other specified parts of digestive tract: Secondary | ICD-10-CM

## 2024-03-22 DIAGNOSIS — E860 Dehydration: Secondary | ICD-10-CM | POA: Diagnosis present

## 2024-03-22 DIAGNOSIS — E871 Hypo-osmolality and hyponatremia: Secondary | ICD-10-CM | POA: Diagnosis present

## 2024-03-22 DIAGNOSIS — Z79899 Other long term (current) drug therapy: Secondary | ICD-10-CM

## 2024-03-22 DIAGNOSIS — R2689 Other abnormalities of gait and mobility: Secondary | ICD-10-CM | POA: Diagnosis present

## 2024-03-22 DIAGNOSIS — D649 Anemia, unspecified: Secondary | ICD-10-CM | POA: Diagnosis present

## 2024-03-22 DIAGNOSIS — I480 Paroxysmal atrial fibrillation: Secondary | ICD-10-CM | POA: Diagnosis present

## 2024-03-22 DIAGNOSIS — I4819 Other persistent atrial fibrillation: Secondary | ICD-10-CM | POA: Diagnosis present

## 2024-03-22 DIAGNOSIS — N400 Enlarged prostate without lower urinary tract symptoms: Secondary | ICD-10-CM | POA: Diagnosis present

## 2024-03-22 DIAGNOSIS — Z885 Allergy status to narcotic agent status: Secondary | ICD-10-CM

## 2024-03-22 DIAGNOSIS — I951 Orthostatic hypotension: Secondary | ICD-10-CM | POA: Diagnosis not present

## 2024-03-22 DIAGNOSIS — W19XXXA Unspecified fall, initial encounter: Principal | ICD-10-CM | POA: Diagnosis present

## 2024-03-22 DIAGNOSIS — N179 Acute kidney failure, unspecified: Secondary | ICD-10-CM | POA: Diagnosis present

## 2024-03-22 DIAGNOSIS — Z961 Presence of intraocular lens: Secondary | ICD-10-CM | POA: Diagnosis present

## 2024-03-22 DIAGNOSIS — R2681 Unsteadiness on feet: Secondary | ICD-10-CM | POA: Diagnosis present

## 2024-03-22 DIAGNOSIS — N1831 Chronic kidney disease, stage 3a: Secondary | ICD-10-CM | POA: Diagnosis present

## 2024-03-22 DIAGNOSIS — Z9841 Cataract extraction status, right eye: Secondary | ICD-10-CM

## 2024-03-22 DIAGNOSIS — Z9842 Cataract extraction status, left eye: Secondary | ICD-10-CM

## 2024-03-22 LAB — TROPONIN I (HIGH SENSITIVITY): Troponin I (High Sensitivity): 30 ng/L — ABNORMAL HIGH (ref <18)

## 2024-03-22 LAB — BRAIN NATRIURETIC PEPTIDE: B Natriuretic Peptide: 355.1 pg/mL — ABNORMAL HIGH (ref 0.0–100.0)

## 2024-03-22 LAB — CBC
HCT: 34.8 % — ABNORMAL LOW (ref 39.0–52.0)
Hemoglobin: 11.5 g/dL — ABNORMAL LOW (ref 13.0–17.0)
MCH: 29.9 pg (ref 26.0–34.0)
MCHC: 33 g/dL (ref 30.0–36.0)
MCV: 90.6 fL (ref 80.0–100.0)
Platelets: 174 10*3/uL (ref 150–400)
RBC: 3.84 MIL/uL — ABNORMAL LOW (ref 4.22–5.81)
RDW: 18.6 % — ABNORMAL HIGH (ref 11.5–15.5)
WBC: 5.7 10*3/uL (ref 4.0–10.5)
nRBC: 0 % (ref 0.0–0.2)

## 2024-03-22 LAB — COMPREHENSIVE METABOLIC PANEL WITH GFR
ALT: 19 U/L (ref 0–44)
AST: 24 U/L (ref 15–41)
Albumin: 3.3 g/dL — ABNORMAL LOW (ref 3.5–5.0)
Alkaline Phosphatase: 80 U/L (ref 38–126)
Anion gap: 9 (ref 5–15)
BUN: 41 mg/dL — ABNORMAL HIGH (ref 8–23)
CO2: 24 mmol/L (ref 22–32)
Calcium: 9.1 mg/dL (ref 8.9–10.3)
Chloride: 101 mmol/L (ref 98–111)
Creatinine, Ser: 1.95 mg/dL — ABNORMAL HIGH (ref 0.61–1.24)
GFR, Estimated: 33 mL/min — ABNORMAL LOW (ref >60)
Glucose, Bld: 132 mg/dL — ABNORMAL HIGH (ref 70–99)
Potassium: 4.8 mmol/L (ref 3.5–5.1)
Sodium: 134 mmol/L — ABNORMAL LOW (ref 135–145)
Total Bilirubin: 0.9 mg/dL (ref 0.0–1.2)
Total Protein: 7.4 g/dL (ref 6.5–8.1)

## 2024-03-22 MED ORDER — SODIUM CHLORIDE 0.9 % IV BOLUS
500.0000 mL | Freq: Once | INTRAVENOUS | Status: AC
  Administered 2024-03-22: 500 mL via INTRAVENOUS

## 2024-03-22 MED ORDER — ENOXAPARIN SODIUM 40 MG/0.4ML IJ SOSY
40.0000 mg | PREFILLED_SYRINGE | INTRAMUSCULAR | Status: DC
  Administered 2024-03-22 – 2024-03-29 (×8): 40 mg via SUBCUTANEOUS
  Filled 2024-03-22 (×8): qty 0.4

## 2024-03-22 MED ORDER — POLYETHYLENE GLYCOL 3350 17 G PO PACK: 17.0000 g | PACK | Freq: Two times a day (BID) | ORAL | Status: DC | PRN

## 2024-03-22 MED ORDER — TAMSULOSIN HCL 0.4 MG PO CAPS
0.4000 mg | ORAL_CAPSULE | Freq: Every day | ORAL | Status: DC
  Administered 2024-03-22 – 2024-03-29 (×8): 0.4 mg via ORAL
  Filled 2024-03-22 (×8): qty 1

## 2024-03-22 MED ORDER — SODIUM CHLORIDE 0.9 % IV SOLN: INTRAVENOUS | Status: AC

## 2024-03-22 MED ORDER — ACETAMINOPHEN 500 MG PO TABS
1000.0000 mg | ORAL_TABLET | Freq: Three times a day (TID) | ORAL | Status: DC | PRN
Start: 2024-03-22 — End: 2024-03-30

## 2024-03-22 MED ORDER — TRAMADOL HCL 50 MG PO TABS
50.0000 mg | ORAL_TABLET | Freq: Four times a day (QID) | ORAL | Status: DC | PRN
  Administered 2024-03-22 – 2024-03-27 (×10): 50 mg via ORAL
  Filled 2024-03-22 (×10): qty 1

## 2024-03-22 MED ORDER — APIXABAN 5 MG PO TABS: 5.0000 mg | ORAL_TABLET | Freq: Two times a day (BID) | ORAL | Status: DC

## 2024-03-22 MED ORDER — ONDANSETRON HCL 4 MG/2ML IJ SOLN: 4.0000 mg | Freq: Four times a day (QID) | INTRAMUSCULAR | Status: DC | PRN

## 2024-03-22 NOTE — ED Notes (Signed)
 Report received. Care assumed. Alert, NAD, calm, interactive, taken to CT xray at this time. Wife at [facility] [address].

## 2024-03-22 NOTE — ED Notes (Signed)
 Back from xray, no changes, still pending CT, alert, NAD, calm, interactive. Family at [facility].

## 2024-03-22 NOTE — H&P (Addendum)
 History and Physical    Mark Blevins ZOX:[id number] DOB: 05/15/1941 DOA: 03/22/2024  PCP: Theron Flavin, MD  Patient coming from: home  I have personally briefly reviewed patient's old medical records in [facility]  Chief Complaint: weakness and falls  HPI: Mark Blevins is a 83 y.o. male with medical history significant of combined CHF,  Severe aortic stenosis, Paroxysmal atrial fibrillation, CKD who presented after a fall and couldn't get up.  Pt reported gradually getting weaker for the past 2-3 weeks, and started having frequent falls.  Had fallen on his buttocks and reported pain in the area.  Did not have LOC.  Also reported having shortness of breath for a few weeks that's constant and not positional.  Pt said he has been having normal oral intake, hydration and urination.  No fever, chest pain, abdominal pain, increased swelling.  Of note, pt was discharged from [facility] on 01/22/24 to SNF rehab after being treated for CHF exacerbation.  Pt said he did well in rehab and was even walking stairs without assistance after he left SNF.  Pt reported his home meds were reduced in numbers during outpatient f/u (but didn't know what he was taking) and was wondering if that contributed to his weakness.     ED Course: initial vitals: afebrile, pulse 84, BP 113/74, RR 20, sating 100% on room air.  Labs notable for Cr 1.95, BUN 41, CXR showed no acute cardiopulmonary abnormality.  Xray showed No acute fracture or dislocation identified about the sacrum, coccyx.  Pt received 500 ml bolus in the ED before being admitted for observation.   Assessment/Plan  Weakness and falls --possibly due to hypotension.   --PT --orthostatic BP assessment  CKD 3a --Cr 1.95 on presentation.  Cr 1.8 on 02/23/24, and 1.42 on 01/22/24.  Gradual Cr increase likely due to diuretic use.  BUN also increasing. --s/p 500 ml bolus in the ED --NS@75  for 12 hours  Chronic combined systolic and diastolic CHF   --currently stable, no sign of fluid overload.   --Echocardiogram with borderline low EF at 45 to 50%, grade 1 diastolic dysfunction and severe aortic stenosis. --during his outpatient cardio visit on 02/23/24, coreg  was d/c'ed, aldactone  25 mg daily continued, and torsemide  reduced from 10 mg to 5 mg daily. --Hold home amlodipine , aldactone  and torsemide  due to soft BP and Cr increase.  Severe aortic stenosis --per echocardiogram.  Thought to contribute to his dyspnea. --plan to follow up with Dr. Philippa Bray with [facility] to assess for candidacy for aortic valve intervention   Paroxysmal atrial fibrillation  --Coreg  recently d/c'ed by outpatient provider --Eliquis  was supposed to be continued, however, med rec indicated pt wasn't taking Eliquis  PTA. --hold Eliquis  for now given frequent falls  BPH --continue home Flomax    DVT prophylaxis: Lovenox  SQ Code Status: Full code  Family Communication:   Disposition Plan: to be determined  Consults called:  Level of care: Med-Surg   Review of Systems: As per HPI otherwise complete review of systems negative.   Past Medical History:  Diagnosis Date   Aortic valve stenosis    moderate (TTE 2022)   Atrial fibrillation (HCC)    BPH (benign prostatic hyperplasia)    Hypertension    Poor circulation of extremity    left leg   Venous reflux    Wears dentures    Full upper and lower    Past Surgical History:  Procedure Laterality Date   APPENDECTOMY     BACK SURGERY  CATARACT EXTRACTION W/PHACO Left 02/05/2022   Procedure: CATARACT EXTRACTION PHACO AND INTRAOCULAR LENS PLACEMENT (IOC) LEFT MALYUGIN 19.75 01:59.8;  Surgeon: Annell Kidney, MD;  Location: [facility];  Service: Ophthalmology;  Laterality: Left;   CATARACT EXTRACTION W/PHACO Right 02/19/2022   Procedure: CATARACT EXTRACTION PHACO AND INTRAOCULAR LENS PLACEMENT (IOC) RIGHT MALYUGIN 1900 01:55.6;  Surgeon: Annell Kidney, MD;  Location: [facility];   Service: Ophthalmology;  Laterality: Right;   COLECTOMY N/A 04/27/2019   Procedure: COLECTOMY WITH COLOSTOMY;  Surgeon: Conrado Delay, DO;  Location: [facility] ORS;  Service: General;  Laterality: N/A;   COLONOSCOPY WITH PROPOFOL  N/A 04/24/2019   Procedure: COLONOSCOPY WITH PROPOFOL ;  Surgeon: Conrado Delay, DO;  Location: [facility];  Service: General;  Laterality: N/A;   LAPAROSCOPIC LYSIS OF ADHESIONS  05/06/2019   Procedure: LAPAROSCOPIC LYSIS OF ADHESIONS;  Surgeon: Conrado Delay, DO;  Location: [facility];  Service: General;;   LAPAROSCOPY N/A 05/06/2019   Procedure: LAPAROSCOPY DIAGNOSTIC;  Surgeon: Conrado Delay, DO;  Location: [facility];  Service: General;  Laterality: N/A;   REPLACEMENT TOTAL KNEE BILATERAL     XI ROBOTIC ASSISTED COLOSTOMY TAKEDOWN N/A 02/03/2020   Procedure: XI ROBOTIC ASSISTED COLOSTOMY TAKEDOWN;  Surgeon: Conrado Delay, DO;  Location: [facility] OR[facility];  Service: General;  Laterality: N/A;     reports that he quit smoking about 54 years ago. His smoking use included cigarettes. His smokeless tobacco use includes chew. He reports current alcohol use of about 24.0 standard drinks of alcohol per week. He reports that he does not use drugs.  Allergies  Allergen Reactions   Codeine Nausea And Vomiting   Morphine And Codeine Nausea And Vomiting    History reviewed. No pertinent family history.   Prior to Admission medications   Medication Sig Start Date End Date Taking? Authorizing Provider  apixaban  (ELIQUIS ) 5 MG TABS tablet Take 1 tablet (5 mg total) by mouth 2 (two) times daily. 04/14/22   Theron Flavin, MD  calcium  carbonate (OS-CAL - DOSED IN MG OF ELEMENTAL CALCIUM ) 1250 (500 Ca) MG tablet Take 1 tablet (1,250 mg total) by mouth 2 (two) times daily with a meal. 01/22/24   Swayze, Ava, DO  carvedilol  (COREG ) 3.125 MG tablet Take 1 tablet (3.125 mg total) by mouth 2 (two) times daily with a meal. 01/22/24   Swayze, Ava, DO  potassium chloride  SA (KLOR-CON  M) 20 MEQ tablet Take 1  tablet (20 mEq total) by mouth daily. 01/22/24   Swayze, Ava, DO  spironolactone  (ALDACTONE ) 25 MG tablet Take 1 tablet (25 mg total) by mouth daily. 01/22/24   Swayze, Ava, DO  tamsulosin  (FLOMAX ) 0.4 MG CAPS capsule Take 1 capsule (0.4 mg total) by mouth daily after supper. 01/22/24   Swayze, Ava, DO  torsemide  (DEMADEX ) 10 MG tablet Take 1 tablet (10 mg total) by mouth daily. 01/22/24   Swayze, Ava, DO    Physical Exam: Vitals:   03/22/24 1030 03/22/24 1045 03/22/24 1100 03/22/24 1115  BP: 112/86 (!) 110/59 111/74 122/71  Pulse: 70 73 74 72  Resp: 11 14 (!) 23 14  Temp:      TempSrc:      SpO2: 100% 100% 100% 100%  Height:        Constitutional: NAD, AAOx3 HEENT: conjunctivae and lids normal, EOMI CV: No cyanosis.   RESP: normal respiratory effort, on RA Extremities: venous stasis changes in BLE, no overt swelling SKIN: warm, dry Neuro: II - XII grossly intact.   Psych: Normal mood and affect.  Appropriate judgement and reason   Labs on Admission: I have personally reviewed labs and imaging studies  Time spent: 75 minutes  Garrison Kanner MD [facility]  If 7PM-7AM, please contact night-coverage 03/22/2024, 12:15 PM

## 2024-03-22 NOTE — ED Provider Notes (Signed)
 [facility] Provider Note    Event Date/Time   First MD Initiated Contact with Patient 03/22/24 [phone]     (approximate)   History   Fall   HPI  Mark Blevins is a 83 y.o. male with a history of severe aortic valve stenosis, atrial fibrillation prevents after a fall.  Patient reports he fell this morning as well as 2 days ago.  He reports this is not uncommon for him.  Over the last year he has had issues with periods of lightheadedness typically only happen while standing, is being evaluated for valve replacement at [facility].  No chest pain.  Compliant with medications     Physical Exam   Triage Vital Signs: ED Triage Vitals  Encounter Vitals Group     BP 03/22/24 0643 113/74     Systolic BP Percentile --      Diastolic BP Percentile --      Pulse Rate 03/22/24 0643 84     Resp 03/22/24 0643 20     Temp 03/22/24 0643 98.1 F (36.7 C)     Temp Source 03/22/24 0643 Oral     SpO2 03/22/24 0643 100 %     Weight --      Height 03/22/24 0644 1.829 m (6')     Head Circumference --      Peak Flow --      Pain Score 03/22/24 0644 10     Pain Loc --      Pain Education --      Exclude from Growth Chart --     Most recent vital signs: Vitals:   03/22/24 1230 03/22/24 1245  BP: 110/75 116/65  Pulse: 67 73  Resp: 14 14  Temp:    SpO2: 100% 100%     General: Awake, no distress.  CV:  Good peripheral perfusion.  Regular rate and rhythm Resp:  Normal effort.  Abd:  Distention Other:  Mild skin tear left elbow, good range of motion of the upper and lower extremities, no pain with axial load on the lower extremities, no vertebral tenderness palpation, no abdominal tenderness to palpation but some distention review of records demonstrates he does have a history of ascites.   ED Results / Procedures / Treatments   Labs (all labs ordered are listed, but only abnormal results are displayed) Labs Reviewed  CBC - Abnormal; Notable for the following  components:      Result Value   RBC 3.84 (*)    Hemoglobin 11.5 (*)    HCT 34.8 (*)    RDW 18.6 (*)    All other components within normal limits  COMPREHENSIVE METABOLIC PANEL WITH GFR - Abnormal; Notable for the following components:   Sodium 134 (*)    Glucose, Bld 132 (*)    BUN 41 (*)    Creatinine, Ser 1.95 (*)    Albumin 3.3 (*)    GFR, Estimated 33 (*)    All other components within normal limits  BRAIN NATRIURETIC PEPTIDE - Abnormal; Notable for the following components:   B Natriuretic Peptide 355.1 (*)    All other components within normal limits  TROPONIN I (HIGH SENSITIVITY) - Abnormal; Notable for the following components:   Troponin I (High Sensitivity) 30 (*)    All other components within normal limits     EKG  ED ECG REPORT I, Bryson Carbine, the attending physician, personally viewed and interpreted this ECG.  Date: 03/22/2024  Rhythm: normal sinus  rhythm QRS Axis: normal Intervals: normal ST/T Wave abnormalities: normal Narrative Interpretation: no evidence of acute ischemia    RADIOLOGY Chest x-ray without evidence of pneumonia    PROCEDURES:  Critical Care performed:   Procedures   MEDICATIONS ORDERED IN ED: Medications  0.9 %  sodium chloride  infusion (has no administration in time range)  tamsulosin  (FLOMAX ) capsule 0.4 mg (has no administration in time range)  apixaban  (ELIQUIS ) tablet 5 mg (has no administration in time range)  sodium chloride  0.9 % bolus 500 mL (0 mLs Intravenous Stopped 03/22/24 0941)     IMPRESSION / MDM / ASSESSMENT AND PLAN / ED COURSE  I reviewed the triage vital signs and the nursing notes. Patient's presentation is most consistent with acute presentation with potential threat to life or bodily function.  Patient presents with a fall as described above.  Suspicious for worsening aortic stenosis as a cause of his increased frequency of lightheadedness.  However differential is extensive, electrolyte  abnormalities, infection are also on the differential, will obtain labs, x-rays of the coccyx where he has bruising from fall on Sunday, also a chest x-ray for some question of increased work of breathing.  Workup is overall reassuring, does have evidence of an AKI secondary to likely dehydration, treat with IV fluids but continued orthostasis after, discussed with the hospitalist for admission      FINAL CLINICAL IMPRESSION(S) / ED DIAGNOSES   Final diagnoses:  Fall, initial encounter  Orthostatic dizziness  Aortic valve stenosis, etiology of cardiac valve disease unspecified  Dehydration     Rx / DC Orders   ED Discharge Orders     None        Note:  This document was prepared using Dragon voice recognition software and may include unintentional dictation errors.   Bryson Carbine, MD 03/22/24 1256

## 2024-03-22 NOTE — ED Notes (Signed)
 ED Provider at bedside.

## 2024-03-22 NOTE — ED Notes (Signed)
 Admitting MD at [facility], pending transport to inpt bed

## 2024-03-22 NOTE — Evaluation (Signed)
 Physical Therapy Evaluation Patient Details Name: Mark Blevins MRN: [id number] DOB: [birth year] Today's Date: 03/22/2024  History of Present Illness  presented to ER secondary to recurrent falls, progressive weakness; admitted for management of AKI (related to dehydration?)  Clinical Impression  Patient resting in bed upon arrival to room; alert and oriented, follows commands and agreeable to participation with session as pain allows.  Rates pain in low back/sacrum at 6-8/10; declines pain medication at this time.  Visible area of ecchymosis and edema over sacrum, corresponding to area of pain.  Bilat UE/LE strength and ROM grossly symmetrical and WFL; no focal weakness appreciated.  Generally guarded, requiring increased time/effort to mobilize, due to pain with any/all movement. Able to complete bed mobility with sup; sit/stand, standing balance with RW, cga/close sup.  Declines progressive mobility/gait at this time due to pain.  Will continue to assess/progress next session as appropriate. Would benefit from skilled PT to address above deficits and promote optimal return to PLOF.; recommend post-acute PT follow up as indicated by interdisciplinary care team.      Orthostatic VS for the past 24 hrs (Last 3 readings):  BP- Lying Pulse- Lying BP- Sitting Pulse- Sitting BP- Standing at 0 minutes Pulse- Standing at 0 minutes  03/22/24 1554 101/71 73 91/73 99 91/73 133  03/22/24 1002 116/70 75 94/62 93 -- --           If plan is discharge home, recommend the following: A little help with walking and/or transfers;A little help with bathing/dressing/bathroom   Can travel by private vehicle        Equipment Recommendations Rolling walker (2 wheels)  Recommendations for Other Services       Functional Status Assessment Patient has had a recent decline in their functional status and demonstrates the ability to make significant improvements in function in a reasonable and predictable amount  of time.     Precautions / Restrictions Precautions Precautions: Fall Restrictions Weight Bearing Restrictions Per Provider Order: No      Mobility  Bed Mobility Overal bed mobility: Needs Assistance Bed Mobility: Supine to Sit, Sit to Supine     Supine to sit: Supervision Sit to supine: Supervision   General bed mobility comments: very slow and deliberate in moving pattern; heavy use of bedrails to assist    Transfers Overall transfer level: Needs assistance Equipment used: Rolling walker (2 wheels) Transfers: Sit to/from Stand Sit to Stand: Supervision, Contact guard assist           General transfer comment: cuing for hand placement with transfer    Ambulation/Gait               General Gait Details: declined due to pain in back/sacrum  Stairs            Wheelchair Mobility     Tilt Bed    Modified Rankin (Stroke Patients Only)       Balance Overall balance assessment: Needs assistance Sitting-balance support: No upper extremity supported, Feet supported Sitting balance-Leahy Scale: Good     Standing balance support: Bilateral upper extremity supported Standing balance-Leahy Scale: Fair                               Pertinent Vitals/Pain Pain Assessment Pain Assessment: Faces Faces Pain Scale: Hurts whole lot Pain Location: low back/sacrum Pain Descriptors / Indicators: Aching, Grimacing, Moaning Pain Intervention(s): Limited activity within patient's tolerance, Monitored during session, Repositioned  Home Living Family/patient expects to be discharged to:: Private residence Living Arrangements: Spouse/significant other Available Help at Discharge: Family Type of Home: House Home Access: Stairs to enter Entrance Stairs-Rails: Right Entrance Stairs-Number of Steps: 3, with safety bar   Home Layout: One level Home Equipment: Agricultural consultant (2 wheels);Cane - quad      Prior Function Prior Level of Function :  Independent/Modified Independent             Mobility Comments: Mod indep with RW for ADLs, mobilization; does endorse at least 2-3 falls in recent weeks.       Extremity/Trunk Assessment   Upper Extremity Assessment Upper Extremity Assessment: Overall WFL for tasks assessed    Lower Extremity Assessment Lower Extremity Assessment: Overall WFL for tasks assessed (grossly 4-/5 throughout)       Communication   Communication Communication: No apparent difficulties    Cognition Arousal: Alert Behavior During Therapy: WFL for tasks assessed/performed   PT - Cognitive impairments: No apparent impairments                         Following commands: Intact       Cueing       General Comments      Exercises     Assessment/Plan    PT Assessment Patient needs continued PT services  PT Problem List Decreased activity tolerance;Decreased mobility;Decreased balance;Decreased knowledge of use of DME;Decreased safety awareness;Decreased knowledge of precautions;Pain       PT Treatment Interventions DME instruction;Gait training;Stair training;Functional mobility training;Therapeutic activities;Therapeutic exercise;Balance training;Patient/family education    PT Goals (Current goals can be found in the Care Plan section)  Acute Rehab PT Goals Patient Stated Goal: to make this pain better PT Goal Formulation: With patient Time For Goal Achievement: 04/05/24 Potential to Achieve Goals: Good    Frequency Min 2X/week     Co-evaluation               AM-PAC PT "6 Clicks" Mobility  Outcome Measure Help needed turning from your back to your side while in a flat bed without using bedrails?: None Help needed moving from lying on your back to sitting on the side of a flat bed without using bedrails?: None Help needed moving to and from a bed to a chair (including a wheelchair)?: A Little Help needed standing up from a chair using your arms (e.g.,  wheelchair or bedside chair)?: A Little Help needed to walk in hospital room?: A Little Help needed climbing 3-5 steps with a railing? : A Little 6 Click Score: 20    End of Session   Activity Tolerance: Patient limited by pain Patient left: in bed;with call bell/phone within reach;with bed alarm set Nurse Communication: Mobility status PT Visit Diagnosis: Muscle weakness (generalized) (M62.81);Difficulty in walking, not elsewhere classified (R26.2);Pain    Time: 1610-9604 PT Time Calculation (min) (ACUTE ONLY): 27 min   Charges:   PT Evaluation $PT Eval Moderate Complexity: 1 Mod   PT General Charges $$ ACUTE PT VISIT: 1 Visit         Tyreshia Ingman H. Bevin Bucks, PT, DPT, NCS 03/22/24, 4:18 PM [phone]

## 2024-03-22 NOTE — ED Triage Notes (Addendum)
 Pt from home via ems, pt has had frequent falls the past week due to weakness. Pt was reaching down for something this morning and became weak and fell, denies hitting head or LOC; pt was unable to get up on his own therefore, he called ems. Pt c/o of buttocks pain from fall on Sunday (bruising noted to right buttocks, sacral area). Pt was 76% on RA. Pt became very sob upon exertion per EMS. Pt arrives on 2L [place].pt removed form ems oxygen. Pt is 100% on RA. NADN. Pt on eliquis .

## 2024-03-22 NOTE — ED Notes (Signed)
 Generally weak, poor bed mobility, poor mobility. Orthostatics complete. Denies dizziness. Endorses general weakness and Buttocks pain, worse with movement. BP dropped, HR slightly higher (afib).

## 2024-03-22 NOTE — ED Notes (Signed)
 EDP at [facility], wife at [facility].

## 2024-03-23 DIAGNOSIS — R296 Repeated falls: Secondary | ICD-10-CM | POA: Diagnosis not present

## 2024-03-23 LAB — CBC
HCT: 30.8 % — ABNORMAL LOW (ref 39.0–52.0)
Hemoglobin: 10.5 g/dL — ABNORMAL LOW (ref 13.0–17.0)
MCH: 30.8 pg (ref 26.0–34.0)
MCHC: 34.1 g/dL (ref 30.0–36.0)
MCV: 90.3 fL (ref 80.0–100.0)
Platelets: 148 10*3/uL — ABNORMAL LOW (ref 150–400)
RBC: 3.41 MIL/uL — ABNORMAL LOW (ref 4.22–5.81)
RDW: 18.3 % — ABNORMAL HIGH (ref 11.5–15.5)
WBC: 5.1 10*3/uL (ref 4.0–10.5)
nRBC: 0 % (ref 0.0–0.2)

## 2024-03-23 LAB — BASIC METABOLIC PANEL WITH GFR
Anion gap: 5 (ref 5–15)
BUN: 35 mg/dL — ABNORMAL HIGH (ref 8–23)
CO2: 21 mmol/L — ABNORMAL LOW (ref 22–32)
Calcium: 8.4 mg/dL — ABNORMAL LOW (ref 8.9–10.3)
Chloride: 107 mmol/L (ref 98–111)
Creatinine, Ser: 1.56 mg/dL — ABNORMAL HIGH (ref 0.61–1.24)
GFR, Estimated: 44 mL/min — ABNORMAL LOW (ref >60)
Glucose, Bld: 91 mg/dL (ref 70–99)
Potassium: 3.9 mmol/L (ref 3.5–5.1)
Sodium: 133 mmol/L — ABNORMAL LOW (ref 135–145)

## 2024-03-23 LAB — MAGNESIUM: Magnesium: 2.2 mg/dL (ref 1.7–2.4)

## 2024-03-23 MED ORDER — LIDOCAINE 5 % EX PTCH
1.0000 | MEDICATED_PATCH | CUTANEOUS | Status: DC
  Administered 2024-03-23: 1 via TRANSDERMAL
  Filled 2024-03-23 (×6): qty 1

## 2024-03-23 NOTE — Plan of Care (Signed)
  Problem: Activity: Goal: Risk for activity intolerance will decrease Outcome: Progressing   Problem: Nutrition: Goal: Adequate nutrition will be maintained Outcome: Progressing   Problem: Coping: Goal: Level of anxiety will decrease Outcome: Progressing   Problem: Pain Managment: Goal: General experience of comfort will improve and/or be controlled Outcome: Progressing   Problem: Skin Integrity: Goal: Risk for impaired skin integrity will decrease Outcome: Progressing

## 2024-03-23 NOTE — Progress Notes (Signed)
 PROGRESS NOTE    Mark Blevins  GMW:[id number] DOB: 1941/04/06 DOA: 03/22/2024 PCP: Theron Flavin, MD  Chief Complaint  Patient presents with   [facility] Course:  Mark Blevins is an 83 year old male with CHF, severe aortic stenosis, paroxysmal A-fib, CKD, who presented after a fall.  Patient reports he has been getting progressively weaker for 2 to 3 weeks and then began having frequent falls.  He reports he is fallen on his butt and is having pain in that area.  Denies any LOC.  He also endorses worsening shortness of breath for the last few weeks that is constant and nonpositional.  Patient was discharged from [facility] on 3/14 to SNF for rehab after being treated for CHF exacerbation.  Patient said he did well in rehab and was walking stairs without assistance when he left SNF. On arrival to the ED vital signs were WNL.  Labs notable for creatinine of 1.95, BUN 41.  CXR without acute pulmonary abnormality.  Pelvic x-ray without acute fracture or dislocation and sacrum or coccyx.  Patient received 500 cc bolus and was admitted for observation  Subjective: endorsing back pain today.  He also endorses some concerns about discharging home due to his weakness.  He is also resistant to rehab.   Objective: Vitals:   03/22/24 1617 03/22/24 1700 03/22/24 2001 03/23/24 0239  BP: 95/63  97/64 91/64  Pulse: 76  80 69  Resp: 16  16 17   Temp: 98 F (36.7 C)  98 F (36.7 C) 97.9 F (36.6 C)  TempSrc:      SpO2: 100%  100% 99%  Weight:  79.3 kg    Height:        Intake/Output Summary (Last 24 hours) at 03/23/2024 0916 Last data filed at 03/22/2024 2315 Gross per 24 hour  Intake 1100.38 ml  Output --  Net 1100.38 ml   Filed Weights   03/22/24 1700  Weight: 79.3 kg    Examination: General exam: Appears calm and comfortable, NAD  Respiratory system: No work of breathing, symmetric chest wall expansion Cardiovascular system: S1 & S2 heard, RRR.  Gastrointestinal system: Abdomen  soft, distended, bowel sounds present x 4 Neuro: Alert and oriented. No focal neurological deficits. Extremities: Symmetric, expected ROM Skin: No rashes, lesions Psychiatry: Demonstrates appropriate judgement and insight. Mood & affect appropriate for situation.   Assessment & Plan:  Principal Problem:   Falls    Weakness and falls - May be secondary to hypotension - Orthostatics Positive - PT and OT evals  Orthostatic hypotension - Blood pressure lying 116/70, pulse 75.  BP sitting: 94/62, pulse 93. - Have discontinued diuretics and antihypertensives as below -- Treat standing Bps only  CKD 3A - Baseline creatinine appears to be between 1.4 and 1.8 - Creatinine 1.95 on presentation, status post 500 cc bolus.  BUN elevated - Gradual creatinine increase may be secondary to chronic diuretic use - Received low-dose NS yesterday, improvement in creatinine to 1.56 today - Continue to trend CMP - Renally dosed with a creatinine clearance of 38 when needed  Chronic combined systolic and diastolic CHF - Currently stable, clinically euvolemic - Echocardiogram: EF 45 to 50%, grade 1 diastolic dysfunction, severe aortic stenosis - Outpatient cardiac visit 4/15: Coreg  discontinued, Aldactone  and furosemide  reduced. - For now hold home dose amlodipine , Aldactone , and torsemide  blood pressures and creatinine elevation  Severe aortic stenosis - Likely contributing to his dyspnea - He plans to follow-up with Dr. Philippa Bray with  [facility] to assess for candidacy of aortic valve intervention  Paroxysmal A-fib - Coreg  recently discontinued by cardiology - Patient was meant to be on Eliquis  though med reconciliation reveals patient was not taking.  Will hold off on resuming Eliquis  at this time given frequent falls  BPH - Continue home dose Flomax   Hyponatremia - Mild.  Monitor.  Received NS  Normocytic anemia - Hemoglobin 10.5 - Will order iron studies  Thrombocytopenia - Platelets 148 -  Continue to trend CBC - No overt signs of bleeding   DVT prophylaxis: lovenox    Code Status: Full Code Disposition: Patient is stabilizing at this time.  He reports he feels too weak to go home but is resistant to rehab.  Will reevaluate in the morning.  Hopefully can discharge tomorrow  Consultants:    Procedures:    Antimicrobials:  Anti-infectives (From admission, onward)    None       Data Reviewed: I have personally reviewed following labs and imaging studies CBC: Recent Labs  Lab 03/22/24 0744 03/23/24 0339  WBC 5.7 5.1  HGB 11.5* 10.5*  HCT 34.8* 30.8*  MCV 90.6 90.3  PLT 174 148*   Basic Metabolic Panel: Recent Labs  Lab 03/22/24 0744 03/23/24 0339  NA 134* 133*  K 4.8 3.9  CL 101 107  CO2 24 21*  GLUCOSE 132* 91  BUN 41* 35*  CREATININE 1.95* 1.56*  CALCIUM  9.1 8.4*  MG  --  2.2   GFR: Estimated Creatinine Clearance: 38.7 mL/min (A) (by C-G formula based on SCr of 1.56 mg/dL (H)). Liver Function Tests: Recent Labs  Lab 03/22/24 0744  AST 24  ALT 19  ALKPHOS 80  BILITOT 0.9  PROT 7.4  ALBUMIN 3.3*   CBG: No results for input(s): "GLUCAP" in the last 168 hours.  No results found for this or any previous visit (from the past 240 hours).   Radiology Studies: DG Sacrum/Coccyx Result Date: 03/22/2024 CLINICAL DATA:  83 year old male with frequent falls. Weakness. Shortness of breath. EXAM: SACRUM AND COCCYX - 2+ VIEW COMPARISON:  CT Abdomen and Pelvis 09/05/2021. FINDINGS: Three views at 0720 hours. On the lateral view the sacral and coccygeal segments appear stable from the 2022 sagittal CT and intact. Chronic lumbosacral junction disc space loss, endplate degeneration. SI joints appear symmetric. Elsewhere the visible pelvis appears intact. There is bowel gas in nondistended rectum, in other lower quadrant bowel loops which are similar to the 2022 CT. IMPRESSION: 1. No acute fracture or dislocation identified about the sacrum, coccyx. 2. Large  bowel gas present to the distal rectum, and gas-filled bowel loops in the lower abdomen and pelvis similar to 2022 CT Abdomen and Pelvis. Electronically Signed   By: Marlise Simpers M.D.   On: 03/22/2024 07:43   DG Chest 2 View Result Date: 03/22/2024 CLINICAL DATA:  83 year old male with frequent falls. Weakness. Shortness of breath. EXAM: CHEST - 2 VIEW COMPARISON:  Chest radiographs 01/15/2024 and earlier. FINDINGS: Supine AP but also lateral views of the chest 0721 hours. Lower lung volumes compared to 2021. Extent rated cardiac size, mediastinal contours remain within normal limits. Visualized tracheal air column is within normal limits. No pneumothorax, pulmonary edema, pleural effusion or confluent lung opacity. Abundant bowel gas throughout the visible abdomen, appears to be mostly large bowel in nature. This is similar to although perhaps increased from CT Abdomen and Pelvis 09/05/2021. No acute osseous abnormality identified. IMPRESSION: 1. Lower lung volumes, no acute cardiopulmonary abnormality. 2. Gas distended bowel  in the visible abdomen, similar to although likely progressed from a CT Abdomen and Pelvis in 2022. Favor chronically decreased colonic inertia. Electronically Signed   By: Marlise Simpers M.D.   On: 03/22/2024 07:41    Scheduled Meds:  enoxaparin  (LOVENOX ) injection  40 mg Subcutaneous Q24H   tamsulosin   0.4 mg Oral QPC supper   Continuous Infusions:   LOS: 0 days  MDM: Patient is high risk for one or more organ failure.  They necessitate ongoing hospitalization for continued IV therapies and subsequent lab monitoring. Total time spent interpreting labs and vitals, reviewing the medical record, coordinating care amongst consultants and care team members, directly assessing and discussing care with the patient and/or family: 55 min  Hyun Marsalis, DO [facility]  To contact the attending physician between 7A-7P please use Epic Chat. To contact the covering physician during after  hours 7P-7A, please review Amion.  03/23/2024, 9:16 AM   *This document has been created with the assistance of dictation software. Please excuse typographical errors. *

## 2024-03-23 NOTE — Progress Notes (Signed)
 Physical Therapy Treatment Patient Details Name: Mark Blevins MRN: [id number] DOB: [birth year] Today's Date: 03/23/2024   History of Present Illness presented to ER secondary to recurrent falls, progressive weakness; admitted for management of AKI (related to dehydration?)    PT Comments  Patient is agreeable to PT session. Patient reports mild sacral pain today with mobility. He does report dizziness with sitting upright with MAP of 65 initially with sitting up, increasing to MAP of 77 with increased sitting time. The patient was able to walk in the room and to the bathroom with rolling walker with one person assistance. He does report mild dizziness with ambulation back to the bed. Activity tolerance limited by fatigue. Recommend to continue PT to maximize independence and facilitate return to prior level of function.    If plan is discharge home, recommend the following: A little help with walking and/or transfers;A little help with bathing/dressing/bathroom   Can travel by private vehicle        Equipment Recommendations  Rolling walker (2 wheels)    Recommendations for Other Services       Precautions / Restrictions Precautions Precautions: Fall Recall of Precautions/Restrictions: Intact Restrictions Weight Bearing Restrictions Per Provider Order: No     Mobility  Bed Mobility Overal bed mobility: Needs Assistance Bed Mobility: Supine to Sit, Sit to Supine     Supine to sit: Contact guard Sit to supine: Supervision   General bed mobility comments: increased time. patient prferes to exit the bed from his left for comfort.    Transfers Overall transfer level: Needs assistance Equipment used: Rolling walker (2 wheels) Transfers: Sit to/from Stand Sit to Stand: Contact guard assist, Supervision           General transfer comment: standing performed from bed and from toilet. safe hand placement with transfers    Ambulation/Gait Ambulation/Gait assistance:  Contact guard assist Gait Distance (Feet): 20 Feet Assistive device: Rolling walker (2 wheels) Gait Pattern/deviations: Decreased stride length, Trunk flexed Gait velocity: decreased     General Gait Details: CGA for safety. mild dizziness reported with ambulation   Stairs             Wheelchair Mobility     Tilt Bed    Modified Rankin (Stroke Patients Only)       Balance Overall balance assessment: Needs assistance Sitting-balance support: No upper extremity supported, Feet supported Sitting balance-Leahy Scale: Good     Standing balance support: Bilateral upper extremity supported Standing balance-Leahy Scale: Fair Standing balance comment: using rolling walker for UE support in standing. patient was able to perform peri hygiene while standing after a bowel movement with no loss of balance and unilateral to no UE support                            Communication Communication Communication: No apparent difficulties  Cognition Arousal: Alert Behavior During Therapy: WFL for tasks assessed/performed   PT - Cognitive impairments: Safety/Judgement                       PT - Cognition Comments: patient does overestimate his abilities and needs education with orthostatic hypotension, sypmtoms, and how this increases fall risk Following commands: Intact      Cueing Cueing Techniques: Verbal cues  Exercises      General Comments General comments (skin integrity, edema, etc.): patient reports dizziness initially with sitting upright with MAP of 65. after sitting several  minutes, dizziness subsides with MAP of 77 prior to walking. education provided to monitor for signs of dizziness with mobility and how this relates to falls.      Pertinent Vitals/Pain Pain Assessment Pain Assessment: Faces Faces Pain Scale: Hurts little more Pain Location: sacrum Pain Descriptors / Indicators: Discomfort Pain Intervention(s): Repositioned, Limited  activity within patient's tolerance, Monitored during session    Home Living                          Prior Function            PT Goals (current goals can now be found in the care plan section) Acute Rehab PT Goals PT Goal Formulation: With patient Time For Goal Achievement: 04/05/24 Potential to Achieve Goals: Good Progress towards PT goals: Progressing toward goals    Frequency    Min 2X/week      PT Plan      Co-evaluation              AM-PAC PT "6 Clicks" Mobility   Outcome Measure  Help needed turning from your back to your side while in a flat bed without using bedrails?: None Help needed moving from lying on your back to sitting on the side of a flat bed without using bedrails?: None Help needed moving to and from a bed to a chair (including a wheelchair)?: A Little Help needed standing up from a chair using your arms (e.g., wheelchair or bedside chair)?: A Little Help needed to walk in hospital room?: A Little Help needed climbing 3-5 steps with a railing? : A Little 6 Click Score: 20    End of Session   Activity Tolerance: Patient tolerated treatment well;Patient limited by fatigue Patient left: in bed;with call bell/phone within reach;with bed alarm set Nurse Communication: Mobility status PT Visit Diagnosis: Muscle weakness (generalized) (M62.81);Difficulty in walking, not elsewhere classified (R26.2);Pain     Time: 1478-2956 PT Time Calculation (min) (ACUTE ONLY): 25 min  Charges:    $Therapeutic Activity: 23-37 mins PT General Charges $$ ACUTE PT VISIT: 1 Visit                     Ozie Bo, PT, MPT    Erlene Hawks 03/23/2024, 3:38 PM

## 2024-03-23 NOTE — Plan of Care (Signed)

## 2024-03-23 NOTE — Care Management Obs Status (Signed)
 MEDICARE OBSERVATION STATUS NOTIFICATION   Patient Details  Name: Mark Blevins MRN: [id number] Date of Birth: 02/24/1941   Medicare Observation Status Notification Given:  Rudolph Cost, CMA 03/23/2024, 2:08 PM

## 2024-03-24 DIAGNOSIS — R296 Repeated falls: Secondary | ICD-10-CM | POA: Diagnosis not present

## 2024-03-24 LAB — CBC WITH DIFFERENTIAL/PLATELET
Abs Immature Granulocytes: 0.04 10*3/uL (ref 0.00–0.07)
Basophils Absolute: 0 10*3/uL (ref 0.0–0.1)
Basophils Relative: 1 %
Eosinophils Absolute: 0.3 10*3/uL (ref 0.0–0.5)
Eosinophils Relative: 7 %
HCT: 30.5 % — ABNORMAL LOW (ref 39.0–52.0)
Hemoglobin: 10.2 g/dL — ABNORMAL LOW (ref 13.0–17.0)
Immature Granulocytes: 1 %
Lymphocytes Relative: 31 %
Lymphs Abs: 1.5 10*3/uL (ref 0.7–4.0)
MCH: 30.7 pg (ref 26.0–34.0)
MCHC: 33.4 g/dL (ref 30.0–36.0)
MCV: 91.9 fL (ref 80.0–100.0)
Monocytes Absolute: 0.4 10*3/uL (ref 0.1–1.0)
Monocytes Relative: 9 %
Neutro Abs: 2.4 10*3/uL (ref 1.7–7.7)
Neutrophils Relative %: 51 %
Platelets: 149 10*3/uL — ABNORMAL LOW (ref 150–400)
RBC: 3.32 MIL/uL — ABNORMAL LOW (ref 4.22–5.81)
RDW: 18.2 % — ABNORMAL HIGH (ref 11.5–15.5)
WBC: 4.7 10*3/uL (ref 4.0–10.5)
nRBC: 0 % (ref 0.0–0.2)

## 2024-03-24 LAB — COMPREHENSIVE METABOLIC PANEL WITH GFR
ALT: 14 U/L (ref 0–44)
AST: 19 U/L (ref 15–41)
Albumin: 2.8 g/dL — ABNORMAL LOW (ref 3.5–5.0)
Alkaline Phosphatase: 65 U/L (ref 38–126)
Anion gap: 6 (ref 5–15)
BUN: 28 mg/dL — ABNORMAL HIGH (ref 8–23)
CO2: 22 mmol/L (ref 22–32)
Calcium: 8.3 mg/dL — ABNORMAL LOW (ref 8.9–10.3)
Chloride: 103 mmol/L (ref 98–111)
Creatinine, Ser: 1.41 mg/dL — ABNORMAL HIGH (ref 0.61–1.24)
GFR, Estimated: 49 mL/min — ABNORMAL LOW (ref >60)
Glucose, Bld: 80 mg/dL (ref 70–99)
Potassium: 3.8 mmol/L (ref 3.5–5.1)
Sodium: 131 mmol/L — ABNORMAL LOW (ref 135–145)
Total Bilirubin: 1 mg/dL (ref 0.0–1.2)
Total Protein: 6.2 g/dL — ABNORMAL LOW (ref 6.5–8.1)

## 2024-03-24 LAB — MAGNESIUM: Magnesium: 2.1 mg/dL (ref 1.7–2.4)

## 2024-03-24 LAB — PHOSPHORUS: Phosphorus: 3.2 mg/dL (ref 2.5–4.6)

## 2024-03-24 NOTE — Progress Notes (Signed)
 PROGRESS NOTE    Mark Blevins  ION:[id number] DOB: 22-May-1941 DOA: 03/22/2024 PCP: Theron Flavin, MD  Chief Complaint  Patient presents with   [facility] Course:  Mark Blevins is an 83 year old male with CHF, severe aortic stenosis, paroxysmal A-fib, CKD, who presented after a fall.  Patient reports he has been getting progressively weaker for 2 to 3 weeks and then began having frequent falls.  He reports he is fallen on his butt and is having pain in that area.  Denies any LOC.  He also endorses worsening shortness of breath for the last few weeks that is constant and nonpositional.  Patient was discharged from [facility] on 3/14 to SNF for rehab after being treated for CHF exacerbation.  Patient said he did well in rehab and was walking stairs without assistance when he left SNF. On arrival to the ED vital signs were WNL.  Labs notable for creatinine of 1.95, BUN 41.  CXR without acute pulmonary abnormality.  Pelvic x-ray without acute fracture or dislocation and sacrum or coccyx.  Patient received 500 cc bolus and was admitted for observation  Subjective: Patient reports lidocaine  patch overnight did not help his back pain.  He believes it made it worse.  He remains very unsteady on his feet, blood pressures are soft when standing.  We again discussed inpatient rehab.  He is resistant to this plan.  We discussed discharging home with home health and he also reports that there is no way he can go home today " I cannot even walk."  I reiterated that inpatient rehab would help him build his strength but he remains resistant to this. I had extensive discussion with his son, Mark Blevins, on the phone today.  He would like some time to speak with his dad directly about the unsafe discharge plan to go directly home.   Objective: Vitals:   03/23/24 1445 03/23/24 2025 03/24/24 0346 03/24/24 0819  BP: 94/68 103/76 (!) 92/57 (!) 89/59  Pulse: 84 96 76 70  Resp: 17 18 16 15   Temp: 97.8 F (36.6 C) 97.8  F (36.6 C) 98.1 F (36.7 C) 97.8 F (36.6 C)  TempSrc:    Oral  SpO2: 100% 96% 97% 100%  Weight:      Height:        Intake/Output Summary (Last 24 hours) at 03/24/2024 1419 Last data filed at 03/23/2024 2300 Gross per 24 hour  Intake 480 ml  Output --  Net 480 ml   Filed Weights   03/22/24 1700  Weight: 79.3 kg    Examination: General exam: Appears calm and comfortable, NAD  Respiratory system: No work of breathing, symmetric chest wall expansion Cardiovascular system: S1 & S2 heard, RRR.  Gastrointestinal system: Abdomen soft, distended, bowel sounds present x 4 Neuro: Alert and oriented. No focal neurological deficits. Extremities: Symmetric, expected ROM Skin: No rashes, lesions Psychiatry: Demonstrates appropriate judgement and insight. Mood & affect appropriate for situation.   Assessment & Plan:  Principal Problem:   Falls    Weakness and falls - Likely multifactorial, orthostatic hypotension certainly playing a role - Frequent falls.  PT and OT recommending SNF.  Patient resistant at this time - Have ordered home health PT/OT but remain hesitant about this discharge plan.  Patient lives with his 53 year old wife who is not present 24/7 in the home  Orthostatic hypotension - Orthostatic vitals confirmed on 2 separate occasions - Have discontinued all diuretics and antihypertensives. - Treat standing blood  pressures only. - Have advised orthostatic precautions with the patient.  CKD 3A - Baseline creatinine appears to be between 1.4 and 1.8 - Creatinine 1.95 on presentation.  Status post small 500 cc bolus - Suspect elevated creatinine secondary to chronic diuretic use, and intermittent hypotension - Have discontinued diuretics. - Continue trending CMP - Creatinine has improved to baseline today. - Renally dose with a creatinine clearance of 42 when needed - Avoid nephrotoxic medications.  Avoid hypotension  Chronic combined systolic and diastolic  CHF - Currently stable, clinically euvolemic - Echocardiogram: EF 45 to 50%, grade 1 diastolic dysfunction, severe aortic stenosis - Outpatient cardiac visit 4/15: Coreg  discontinued, Aldactone  and furosemide  reduced. - Blood pressures remain low.  For now have discontinued all diuretics and antihypertensives.  No evidence of volume overload on exam.  Continue to monitor volume status closely   Severe aortic stenosis - Likely contributing to his dyspnea - He plans to follow-up with Dr. Philippa Bray with [facility] to assess for candidacy of aortic valve intervention  Paroxysmal A-fib - Currently rate controlled without beta-blocker - Patient was meant to be on Eliquis  though med reconciliation reveals patient was not taking.  Will not restart Eliquis  at this time given frequent falls.  High bleeding risk   BPH - Continue home dose Flomax   Hyponatremia - Mild.  Monitor.  Received NS  Normocytic anemia - Hemoglobin 10.5 - Have ordered iron studies.  Follow  Thrombocytopenia - Platelets stable. - Continue to trend CBC - No overt signs of bleeding   DVT prophylaxis: lovenox    Code Status: Full Code Disposition:   He reports he feels too weak to go home but is resistant to rehab/SNF.  Discussed with his son who would like time to make arrangements at home and discuss again with his dad.  Planning for discharge tomorrow morning Consultants:    Procedures:    Antimicrobials:  Anti-infectives (From admission, onward)    None       Data Reviewed: I have personally reviewed following labs and imaging studies CBC: Recent Labs  Lab 03/22/24 0744 03/23/24 0339 03/24/24 0355  WBC 5.7 5.1 4.7  NEUTROABS  --   --  2.4  HGB 11.5* 10.5* 10.2*  HCT 34.8* 30.8* 30.5*  MCV 90.6 90.3 91.9  PLT 174 148* 149*   Basic Metabolic Panel: Recent Labs  Lab 03/22/24 0744 03/23/24 0339 03/24/24 0355  NA 134* 133* 131*  K 4.8 3.9 3.8  CL 101 107 103  CO2 24 21* 22  GLUCOSE 132* 91 80  BUN  41* 35* 28*  CREATININE 1.95* 1.56* 1.41*  CALCIUM  9.1 8.4* 8.3*  MG  --  2.2 2.1  PHOS  --   --  3.2   GFR: Estimated Creatinine Clearance: 42.8 mL/min (A) (by C-G formula based on SCr of 1.41 mg/dL (H)). Liver Function Tests: Recent Labs  Lab 03/22/24 0744 03/24/24 0355  AST 24 19  ALT 19 14  ALKPHOS 80 65  BILITOT 0.9 1.0  PROT 7.4 6.2*  ALBUMIN 3.3* 2.8*   CBG: No results for input(s): "GLUCAP" in the last 168 hours.  No results found for this or any previous visit (from the past 240 hours).   Radiology Studies: No results found.   Scheduled Meds:  enoxaparin  (LOVENOX ) injection  40 mg Subcutaneous Q24H   lidocaine   1 patch Transdermal Q24H   tamsulosin   0.4 mg Oral QPC supper   Continuous Infusions:   LOS: 0 days  MDM: Patient is high  risk for one or more organ failure.  They necessitate ongoing hospitalization for continued IV therapies and subsequent lab monitoring. Total time spent interpreting labs and vitals, reviewing the medical record, coordinating care amongst consultants and care team members, directly assessing and discussing care with the patient and/or family: 55 min  Tracie Lindbloom, DO [facility]  To contact the attending physician between 7A-7P please use Epic Chat. To contact the covering physician during after hours 7P-7A, please review Amion.  03/24/2024, 2:19 PM   *This document has been created with the assistance of dictation software. Please excuse typographical errors. *

## 2024-03-24 NOTE — Plan of Care (Signed)

## 2024-03-24 NOTE — TOC Progression Note (Addendum)
 Transition of Care [facility]) - Progression Note    Patient Details  Name: Mark Blevins MRN: [id number] Date of Birth: Nov 20, 1940  [facility] of [facility]) CM/SW Contact  Alexandra Ice, RN Phone Number: 03/24/2024, 4:39 PM  Clinical Narrative:     Met with patient discuss discharge planning, patient agreeable to home health services. He has no preference to which agency to use. TOC sent referral to Shaun with [facility].        Expected Discharge Plan and Services                                               Social Determinants of Health (SDOH) Interventions SDOH Screenings   Food Insecurity: No Food Insecurity (03/22/2024)  Housing: Low Risk  (03/22/2024)  Transportation Needs: No Transportation Needs (03/22/2024)  Utilities: Not At Risk (03/22/2024)  Alcohol Screen: Low Risk  (08/22/2021)  Depression (PHQ2-9): Low Risk  (01/13/2022)  Financial Resource Strain: Low Risk  (08/22/2021)  Physical Activity: Insufficiently Active (08/22/2021)  Social Connections: Socially Integrated (03/22/2024)  Stress: No Stress Concern Present (08/22/2021)  Tobacco Use: High Risk (03/22/2024)    Readmission Risk Interventions     No data to display

## 2024-03-24 NOTE — Progress Notes (Signed)
 Physical Therapy Treatment Patient Details Name: Mark Blevins MRN: [id number] DOB: Mar 23, 1941 Today's Date: 03/24/2024   History of Present Illness presented to ER secondary to recurrent falls, progressive weakness; admitted for management of AKI (related to dehydration?)    PT Comments  Patient continues to have orthostatic hypotension and is symptomatic with activity. With seated rest breaks, symptoms improve but return not long after standing. Unable to safely progress mobility further at this time and patient is a high fall risk. He also continues to have sacral pain and feels unable to sit up in the chair at this time due to discomfort. He also reports he feels unable to manage at home today. Consider rehabilitation < 3 hours/day pending progress with mobility and activity tolerance.   Orthostatic vitals during PT session:  BP- Lying Pulse- Lying BP- Sitting Pulse- Sitting BP- Standing at 0 minutes Pulse- Standing at 0 minutes  03/24/24 1045 96/72 69 (!) 55/43 79 (!) 80/59 78       If plan is discharge home, recommend the following: A little help with walking and/or transfers;A little help with bathing/dressing/bathroom   Can travel by private vehicle     No  Equipment Recommendations  Rolling walker (2 wheels)    Recommendations for Other Services       Precautions / Restrictions Precautions Precautions: Fall Recall of Precautions/Restrictions: Intact Restrictions Weight Bearing Restrictions Per Provider Order: No     Mobility  Bed Mobility Overal bed mobility: Needs Assistance Bed Mobility: Supine to Sit, Sit to Supine     Supine to sit: Contact guard Sit to supine: Min assist   General bed mobility comments: assistance required intermittently for LE support to return to bed. patient reports he feels unable to sit up in the chair due to sacral pain. he is essentially almost flat in the bed for comfort at end of session (which likely is not helping the blood  pressure issues). he declined sitting up in the chair    Transfers Overall transfer level: Needs assistance Equipment used: Rolling walker (2 wheels) Transfers: Sit to/from Stand Sit to Stand: Contact guard assist           General transfer comment: CGA for safety for standing from bed and from toilet    Ambulation/Gait Ambulation/Gait assistance: Contact guard assist Gait Distance (Feet): 40 Feet (27ft, 24ft) Assistive device: Rolling walker (2 wheels) Gait Pattern/deviations: Decreased stride length, Trunk flexed Gait velocity: decreased     General Gait Details: encouraged rolling walker for safety with ambulation. also encouraged patient to monitor for signs of increasing dizziness and need for seated rest breaks. blood pressure after walking was 67/46 with lightheadedness reported.   Stairs             Wheelchair Mobility     Tilt Bed    Modified Rankin (Stroke Patients Only)       Balance Overall balance assessment: Needs assistance Sitting-balance support: No upper extremity supported, Feet supported Sitting balance-Leahy Scale: Good     Standing balance support: Bilateral upper extremity supported Standing balance-Leahy Scale: Fair                              Hotel manager: No apparent difficulties  Cognition Arousal: Alert Behavior During Therapy: WFL for tasks assessed/performed   PT - Cognitive impairments: Safety/Judgement  PT - Cognition Comments: cues for safety to monitor for sings for need of rest breaks/fall prevention Following commands: Intact      Cueing Cueing Techniques: Verbal cues  Exercises      General Comments General comments (skin integrity, edema, etc.): + orthostatics today. lightheaded with sitting up initially (subsides after short seated rest break) as well as with standing/ambulation. patient feels he cannot manage at home at this time       Pertinent Vitals/Pain Pain Assessment Pain Assessment: Faces Faces Pain Scale: Hurts even more Pain Location: sacrum mostly with bed mobility Pain Descriptors / Indicators: Discomfort Pain Intervention(s): Limited activity within patient's tolerance, Monitored during session, Repositioned    Home Living                          Prior Function            PT Goals (current goals can now be found in the care plan section) Acute Rehab PT Goals Patient Stated Goal: to have better BP and go home PT Goal Formulation: With patient Time For Goal Achievement: 04/05/24 Potential to Achieve Goals: Good Progress towards PT goals: Progressing toward goals    Frequency    Min 2X/week      PT Plan      Co-evaluation              AM-PAC PT "6 Clicks" Mobility   Outcome Measure  Help needed turning from your back to your side while in a flat bed without using bedrails?: None Help needed moving from lying on your back to sitting on the side of a flat bed without using bedrails?: A Little Help needed moving to and from a bed to a chair (including a wheelchair)?: A Little Help needed standing up from a chair using your arms (e.g., wheelchair or bedside chair)?: A Little Help needed to walk in hospital room?: A Little Help needed climbing 3-5 steps with a railing? : A Lot 6 Click Score: 18    End of Session Equipment Utilized During Treatment: Gait belt Activity Tolerance: Patient tolerated treatment well;Patient limited by fatigue Patient left: in bed;with call bell/phone within reach;with bed alarm set Nurse Communication: Mobility status PT Visit Diagnosis: Muscle weakness (generalized) (M62.81);Difficulty in walking, not elsewhere classified (R26.2);Pain     Time: 1025-1055 PT Time Calculation (min) (ACUTE ONLY): 30 min  Charges:    $Therapeutic Activity: 23-37 mins PT General Charges $$ ACUTE PT VISIT: 1 Visit                     Ozie Bo, PT,  MPT    Erlene Hawks 03/24/2024, 12:30 PM

## 2024-03-24 NOTE — Plan of Care (Signed)

## 2024-03-25 DIAGNOSIS — R296 Repeated falls: Secondary | ICD-10-CM | POA: Diagnosis not present

## 2024-03-25 LAB — COMPREHENSIVE METABOLIC PANEL WITH GFR
ALT: 14 U/L (ref 0–44)
AST: 19 U/L (ref 15–41)
Albumin: 2.8 g/dL — ABNORMAL LOW (ref 3.5–5.0)
Alkaline Phosphatase: 62 U/L (ref 38–126)
Anion gap: 8 (ref 5–15)
BUN: 25 mg/dL — ABNORMAL HIGH (ref 8–23)
CO2: 22 mmol/L (ref 22–32)
Calcium: 8.5 mg/dL — ABNORMAL LOW (ref 8.9–10.3)
Chloride: 100 mmol/L (ref 98–111)
Creatinine, Ser: 1.3 mg/dL — ABNORMAL HIGH (ref 0.61–1.24)
GFR, Estimated: 54 mL/min — ABNORMAL LOW (ref >60)
Glucose, Bld: 93 mg/dL (ref 70–99)
Potassium: 3.7 mmol/L (ref 3.5–5.1)
Sodium: 130 mmol/L — ABNORMAL LOW (ref 135–145)
Total Bilirubin: 1.1 mg/dL (ref 0.0–1.2)
Total Protein: 6.2 g/dL — ABNORMAL LOW (ref 6.5–8.1)

## 2024-03-25 LAB — CBC WITH DIFFERENTIAL/PLATELET
Abs Immature Granulocytes: 0.03 10*3/uL (ref 0.00–0.07)
Basophils Absolute: 0 10*3/uL (ref 0.0–0.1)
Basophils Relative: 1 %
Eosinophils Absolute: 0.4 10*3/uL (ref 0.0–0.5)
Eosinophils Relative: 7 %
HCT: 29.2 % — ABNORMAL LOW (ref 39.0–52.0)
Hemoglobin: 9.9 g/dL — ABNORMAL LOW (ref 13.0–17.0)
Immature Granulocytes: 1 %
Lymphocytes Relative: 24 %
Lymphs Abs: 1.1 10*3/uL (ref 0.7–4.0)
MCH: 30.3 pg (ref 26.0–34.0)
MCHC: 33.9 g/dL (ref 30.0–36.0)
MCV: 89.3 fL (ref 80.0–100.0)
Monocytes Absolute: 0.5 10*3/uL (ref 0.1–1.0)
Monocytes Relative: 11 %
Neutro Abs: 2.7 10*3/uL (ref 1.7–7.7)
Neutrophils Relative %: 56 %
Platelets: 150 10*3/uL (ref 150–400)
RBC: 3.27 MIL/uL — ABNORMAL LOW (ref 4.22–5.81)
RDW: 17.6 % — ABNORMAL HIGH (ref 11.5–15.5)
WBC: 4.8 10*3/uL (ref 4.0–10.5)
nRBC: 0 % (ref 0.0–0.2)

## 2024-03-25 LAB — IRON AND TIBC
Iron: 44 ug/dL — ABNORMAL LOW (ref 45–182)
Saturation Ratios: 20 % (ref 17.9–39.5)
TIBC: 216 ug/dL — ABNORMAL LOW (ref 250–450)
UIBC: 172 ug/dL

## 2024-03-25 LAB — PHOSPHORUS: Phosphorus: 3.1 mg/dL (ref 2.5–4.6)

## 2024-03-25 LAB — MAGNESIUM: Magnesium: 2 mg/dL (ref 1.7–2.4)

## 2024-03-25 MED ORDER — MIDODRINE HCL 5 MG PO TABS
2.5000 mg | ORAL_TABLET | Freq: Three times a day (TID) | ORAL | Status: DC
  Administered 2024-03-25 – 2024-03-27 (×5): 2.5 mg via ORAL
  Filled 2024-03-25 (×5): qty 1

## 2024-03-25 NOTE — Progress Notes (Signed)
 PROGRESS NOTE    Mark Blevins  WRU:[id number] DOB: January 14, 1941 DOA: 03/22/2024 PCP: Theron Flavin, MD  Chief Complaint  Patient presents with   [facility] Course:  Mark Blevins is an 83 year old male with CHF, severe aortic stenosis, paroxysmal A-fib, CKD, who presented after a fall.  Patient reports he has been getting progressively weaker for 2 to 3 weeks and then began having frequent falls.  He reports he is fallen on his butt and is having pain in that area.  Denies any LOC.  He also endorses worsening shortness of breath for the last few weeks that is constant and nonpositional.  Patient was discharged from [facility] on 3/14 to SNF for rehab after being treated for CHF exacerbation.  Patient said he did well in rehab and was walking stairs without assistance when he left SNF. On arrival to the ED vital signs were WNL.  Labs notable for creatinine of 1.95, BUN 41.  CXR without acute pulmonary abnormality.  Pelvic x-ray without acute fracture or dislocation and sacrum or coccyx.  Patient received 500 cc bolus and was admitted for observation  Subjective: No acute events overnight.  Patient is more amendable to rehab today. Patient reports when trying to ambulate earlier he became very dizzy and lightheaded and felt he was going to pass out.  Because of this he would like to return to rehab for safer discharge plan.  Objective: Vitals:   03/24/24 1535 03/24/24 1933 03/25/24 0354 03/25/24 0721  BP: 94/70 102/60 93/68 99/65   Pulse: 72 71 74 65  Resp: 15 17 16 17   Temp: 98.3 F (36.8 C) 98.3 F (36.8 C) 98.1 F (36.7 C) 98.1 F (36.7 C)  TempSrc: Oral     SpO2: 97% 100% 100% 100%  Weight:      Height:       No intake or output data in the 24 hours ending 03/25/24 0824  Filed Weights   03/22/24 1700  Weight: 79.3 kg    Examination: General exam: Appears calm and comfortable, NAD  Respiratory system: No work of breathing, symmetric chest wall expansion Cardiovascular  system: S1 & S2 heard, RRR.  Gastrointestinal system: Abdomen soft, distended, bowel sounds present x 4 Neuro: Alert and oriented. No focal neurological deficits. Extremities: Symmetric, expected ROM Skin: No rashes, lesions Psychiatry: Demonstrates appropriate judgement and insight. Mood & affect appropriate for situation.   Assessment & Plan:  Principal Problem:   Falls    Weakness and falls - Likely multifactorial, orthostatic hypotension certainly playing a role - PT and OT recommending SNF.  Patient initially resistant but more amendable now - At baseline he resides with his 33 year old wife who is not present in the home 24/7  Orthostatic hypotension - Orthostatic vitals confirmed on 2 separate occasions - Have discontinued all diuretics and antihypertensives. - Will add low-dose 2.5 mg midodrine 3 times daily.  Patient continues to have near syncopal episodes upon standing due to orthostatic drop. - Have discussed orthostatic precautions with the patient - PT/OT for gait stability as above  CKD 3A - Baseline creatinine appears to be between 1.4 and 1.8 - Creatinine 1.95 on presentation.  Status post small 500 cc bolus - Suspect elevated creatinine secondary to chronic diuretic use, and intermittent hypotension - Have discontinued diuretics. - Continue trending CMP. - Creatinine has improved to baseline - Renally dosed with a creatinine clearance of 46 when needed - Avoid nephrotoxic medications.  Avoid hypotension. - Midodrine as above  Chronic combined systolic and diastolic CHF - Currently stable, clinically euvolemic - Echocardiogram: EF 45 to 50%, grade 1 diastolic dysfunction, severe aortic stenosis - Outpatient cardiac visit 4/15: Coreg  discontinued, Aldactone  and furosemide  reduced. - Blood pressures as above.  Have discontinued all diuretics and antihypertensives.  No evidence of volume overload on exam.  Continue to monitor volume status very closely.  Severe  aortic stenosis - Likely contributing to his dyspnea and near syncope. - He plans to follow-up with Dr. Philippa Bray with [facility] to assess for candidacy of aortic valve intervention  Paroxysmal A-fib - Currently rate controlled without beta-blocker - Patient was meant to be on Eliquis  though med reconciliation reveals patient was not taking.  Will not restart Eliquis  at this time given frequent falls.  High bleeding risk   BPH - Continue low-dose Flomax   Hyponatremia - Continue monitoring.  Has received NS.  Avoid additional IV fluids if possible given history of CHF.    Normocytic anemia - Hemoglobin 10.5 - Have ordered iron studies.  Follow  Thrombocytopenia - Platelets stable. - Continue to trend CBC - No overt signs of bleeding   DVT prophylaxis: lovenox    Code Status: Full Code Disposition:   Pending SNF/rehab placement.   Consultants:    Procedures:    Antimicrobials:  Anti-infectives (From admission, onward)    None       Data Reviewed: I have personally reviewed following labs and imaging studies CBC: Recent Labs  Lab 03/22/24 0744 03/23/24 0339 03/24/24 0355 03/25/24 0544  WBC 5.7 5.1 4.7 4.8  NEUTROABS  --   --  2.4 2.7  HGB 11.5* 10.5* 10.2* 9.9*  HCT 34.8* 30.8* 30.5* 29.2*  MCV 90.6 90.3 91.9 89.3  PLT 174 148* 149* 150   Basic Metabolic Panel: Recent Labs  Lab 03/22/24 0744 03/23/24 0339 03/24/24 0355 03/25/24 0544  NA 134* 133* 131* 130*  K 4.8 3.9 3.8 3.7  CL 101 107 103 100  CO2 24 21* 22 22  GLUCOSE 132* 91 80 93  BUN 41* 35* 28* 25*  CREATININE 1.95* 1.56* 1.41* 1.30*  CALCIUM  9.1 8.4* 8.3* 8.5*  MG  --  2.2 2.1 2.0  PHOS  --   --  3.2 3.1   GFR: Estimated Creatinine Clearance: 46.4 mL/min (A) (by C-G formula based on SCr of 1.3 mg/dL (H)). Liver Function Tests: Recent Labs  Lab 03/22/24 0744 03/24/24 0355 03/25/24 0544  AST 24 19 19   ALT 19 14 14   ALKPHOS 80 65 62  BILITOT 0.9 1.0 1.1  PROT 7.4 6.2* 6.2*  ALBUMIN 3.3*  2.8* 2.8*   CBG: No results for input(s): "GLUCAP" in the last 168 hours.  No results found for this or any previous visit (from the past 240 hours).   Radiology Studies: No results found.   Scheduled Meds:  enoxaparin  (LOVENOX ) injection  40 mg Subcutaneous Q24H   lidocaine   1 patch Transdermal Q24H   tamsulosin   0.4 mg Oral QPC supper   Continuous Infusions:   LOS: 0 days  MDM: Patient is high risk for one or more organ failure.  They necessitate ongoing hospitalization for continued IV therapies and subsequent lab monitoring. Total time spent interpreting labs and vitals, reviewing the medical record, coordinating care amongst consultants and care team members, directly assessing and discussing care with the patient and/or family: 55 min  Keyle Doby, DO [facility]  To contact the attending physician between 7A-7P please use Epic Chat. To contact the covering physician during  after hours 7P-7A, please review Amion.  03/25/2024, 8:24 AM   *This document has been created with the assistance of dictation software. Please excuse typographical errors. *

## 2024-03-25 NOTE — TOC Progression Note (Signed)
 Transition of Care [facility]) - Progression Note    Patient Details  Name: Mark Blevins MRN: [id number] Date of Birth: May 18, 1941  [facility]) CM/SW Contact  Alexandra Ice, RN Phone Number: 03/25/2024, 11:19 AM  Clinical Narrative:      Received message from bedside nurse, patient now agreeable to SNF for additional rehab. PASRR, FL2, and bedsearch completed. Patient pending bed offers.       Expected Discharge Plan and Services                                               Social Determinants of Health (SDOH) Interventions SDOH Screenings   Food Insecurity: No Food Insecurity (03/22/2024)  Housing: Low Risk  (03/22/2024)  Transportation Needs: No Transportation Needs (03/22/2024)  Utilities: Not At Risk (03/22/2024)  Alcohol Screen: Low Risk  (08/22/2021)  Depression (PHQ2-9): Low Risk  (01/13/2022)  Financial Resource Strain: Low Risk  (08/22/2021)  Physical Activity: Insufficiently Active (08/22/2021)  Social Connections: Socially Integrated (03/22/2024)  Stress: No Stress Concern Present (08/22/2021)  Tobacco Use: High Risk (03/22/2024)    Readmission Risk Interventions     No data to display

## 2024-03-25 NOTE — [place] FL2 (Signed)
 [place]  MEDICAID FL2 LEVEL OF CARE FORM     IDENTIFICATION  Patient Name: Mark Blevins Birthdate: Sep 08, 1941 Sex: male Admission Date (Current Location): 03/22/2024  [facility] and [place] Number:  Chiropodist and Address:  [facility], [address] [place], [place] [address]      Provider Number: [id number]  Attending Physician Name and Address:  Roise Cleaver, DO  Relative Name and Phone Number:  Spouse: Berklee Zachar    Current Level of Care: Hospital Recommended Level of Care: Skilled Nursing Facility Prior Approval Number:    Date Approved/Denied:   PASRR Number: [id number]  Discharge Plan: SNF    Current Diagnoses: Patient Active Problem List   Diagnosis Date Noted   Falls 03/22/2024   Debility 01/22/2024   CKD (chronic kidney disease), stage II 01/15/2024   Acute urinary retention 01/15/2024   Hypokalemia 01/15/2024   BPH (benign prostatic hyperplasia)    Acute on chronic combined systolic and diastolic CHF (congestive heart failure) (HCC) 01/14/2024   CHF (congestive heart failure) (HCC) 01/14/2024   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   Venous stasis ulcers (HCC) 09/28/2023   Chronic venous insufficiency 09/28/2023   Lymphedema 09/28/2023   Cellulitis and abscess of leg 04/05/2021   Obesity (BMI 30-39.9) 08/14/2020   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/2020   Benign essential HTN 01/31/2020   Aortic stenosis 01/31/2020   Paroxysmal atrial fibrillation (HCC) 01/04/2020   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   Essential hypertension 12/29/2017   Swelling of limb 12/29/2017   Varicose veins of leg with swelling, left 12/29/2017    Orientation RESPIRATION BLADDER Height & Weight     Self, Time, Situation, Place  Normal Continent Weight: 79.3 kg Height:  6' (182.9 cm)  BEHAVIORAL SYMPTOMS/MOOD NEUROLOGICAL BOWEL NUTRITION STATUS      Continent Diet (Heart Healthy,  thin liquid)  AMBULATORY STATUS COMMUNICATION OF NEEDS Skin   Limited Assist Verbally Normal                       Personal Care Assistance Level of Assistance  Bathing, Feeding Bathing Assistance: Limited assistance Feeding assistance: Limited assistance       Functional Limitations Info             SPECIAL CARE FACTORS FREQUENCY  PT (By licensed PT), OT (By licensed OT)     PT Frequency: 5 times per week OT Frequency: 5 times per week            Contractures Contractures Info: Not present    Additional Factors Info  Code Status, Allergies Code Status Info: Full Code Allergies Info: Codeine, Morphine           Current Medications (03/25/2024):  This is the current hospital active medication list Current Facility-Administered Medications  Medication Dose Route Frequency Provider Last Rate Last Admin   acetaminophen  (TYLENOL ) tablet 1,000 mg  1,000 mg Oral TID PRN Garrison Kanner, MD       enoxaparin  (LOVENOX ) injection 40 mg  40 mg Subcutaneous Q24H Garrison Kanner, MD   40 mg at 03/24/24 1912   lidocaine  (LIDODERM ) 5 % 1 patch  1 patch Transdermal Q24H Dezii, Alexandra, DO   1 patch at 03/23/24 1941   ondansetron  (ZOFRAN ) injection 4 mg  4 mg Intravenous Q6H PRN Garrison Kanner, MD       polyethylene glycol (MIRALAX / GLYCOLAX) packet 17 g  17 g Oral BID  PRN Garrison Kanner, MD       tamsulosin  (FLOMAX ) capsule 0.4 mg  0.4 mg Oral QPC supper Garrison Kanner, MD   0.4 mg at 03/24/24 1911   traMADol  (ULTRAM ) tablet 50 mg  50 mg Oral Q6H PRN Garrison Kanner, MD   50 mg at 03/24/24 1911     Discharge Medications: Please see discharge summary for a list of discharge medications.  Relevant Imaging Results:  Relevant Lab Results:   Additional Information SSN# [id number]  Alexandra Ice, RN

## 2024-03-25 NOTE — Plan of Care (Signed)
  Problem: Nutrition: Goal: Adequate nutrition will be maintained Outcome: Progressing   Problem: Activity: Goal: Risk for activity intolerance will decrease Outcome: Progressing   Problem: Coping: Goal: Level of anxiety will decrease Outcome: Progressing   Problem: Pain Managment: Goal: General experience of comfort will improve and/or be controlled Outcome: Progressing

## 2024-03-26 DIAGNOSIS — R296 Repeated falls: Secondary | ICD-10-CM | POA: Diagnosis not present

## 2024-03-26 LAB — CBC WITH DIFFERENTIAL/PLATELET
Abs Immature Granulocytes: 0.03 10*3/uL (ref 0.00–0.07)
Basophils Absolute: 0 10*3/uL (ref 0.0–0.1)
Basophils Relative: 1 %
Eosinophils Absolute: 0.3 10*3/uL (ref 0.0–0.5)
Eosinophils Relative: 7 %
HCT: 27.7 % — ABNORMAL LOW (ref 39.0–52.0)
Hemoglobin: 9.4 g/dL — ABNORMAL LOW (ref 13.0–17.0)
Immature Granulocytes: 1 %
Lymphocytes Relative: 28 %
Lymphs Abs: 1.1 10*3/uL (ref 0.7–4.0)
MCH: 30.2 pg (ref 26.0–34.0)
MCHC: 33.9 g/dL (ref 30.0–36.0)
MCV: 89.1 fL (ref 80.0–100.0)
Monocytes Absolute: 0.4 10*3/uL (ref 0.1–1.0)
Monocytes Relative: 10 %
Neutro Abs: 2.1 10*3/uL (ref 1.7–7.7)
Neutrophils Relative %: 53 %
Platelets: 142 10*3/uL — ABNORMAL LOW (ref 150–400)
RBC: 3.11 MIL/uL — ABNORMAL LOW (ref 4.22–5.81)
RDW: 17.4 % — ABNORMAL HIGH (ref 11.5–15.5)
WBC: 4 10*3/uL (ref 4.0–10.5)
nRBC: 0 % (ref 0.0–0.2)

## 2024-03-26 LAB — COMPREHENSIVE METABOLIC PANEL WITH GFR
ALT: 13 U/L (ref 0–44)
AST: 18 U/L (ref 15–41)
Albumin: 2.8 g/dL — ABNORMAL LOW (ref 3.5–5.0)
Alkaline Phosphatase: 62 U/L (ref 38–126)
Anion gap: 6 (ref 5–15)
BUN: 26 mg/dL — ABNORMAL HIGH (ref 8–23)
CO2: 21 mmol/L — ABNORMAL LOW (ref 22–32)
Calcium: 8.4 mg/dL — ABNORMAL LOW (ref 8.9–10.3)
Chloride: 103 mmol/L (ref 98–111)
Creatinine, Ser: 1.37 mg/dL — ABNORMAL HIGH (ref 0.61–1.24)
GFR, Estimated: 51 mL/min — ABNORMAL LOW (ref >60)
Glucose, Bld: 115 mg/dL — ABNORMAL HIGH (ref 70–99)
Potassium: 3.6 mmol/L (ref 3.5–5.1)
Sodium: 130 mmol/L — ABNORMAL LOW (ref 135–145)
Total Bilirubin: 1 mg/dL (ref 0.0–1.2)
Total Protein: 6.1 g/dL — ABNORMAL LOW (ref 6.5–8.1)

## 2024-03-26 LAB — MAGNESIUM: Magnesium: 2.1 mg/dL (ref 1.7–2.4)

## 2024-03-26 LAB — PHOSPHORUS: Phosphorus: 3 mg/dL (ref 2.5–4.6)

## 2024-03-26 MED ORDER — FERROUS SULFATE 325 (65 FE) MG PO TABS
325.0000 mg | ORAL_TABLET | Freq: Every day | ORAL | Status: DC
  Administered 2024-03-27 – 2024-03-30 (×4): 325 mg via ORAL
  Filled 2024-03-26 (×4): qty 1

## 2024-03-26 MED ORDER — DOCUSATE SODIUM 100 MG PO CAPS
100.0000 mg | ORAL_CAPSULE | Freq: Every day | ORAL | Status: DC
  Administered 2024-03-26 – 2024-03-28 (×3): 100 mg via ORAL
  Filled 2024-03-26 (×5): qty 1

## 2024-03-26 NOTE — Plan of Care (Signed)
  Problem: Health Behavior/Discharge Planning: Goal: Ability to manage health-related needs will improve Outcome: Progressing   Problem: Pain Managment: Goal: General experience of comfort will improve and/or be controlled Outcome: Progressing

## 2024-03-26 NOTE — Progress Notes (Signed)
 PROGRESS NOTE    Mark Blevins  ZOX:[id number] DOB: 08/29/1941 DOA: 03/22/2024 PCP: Theron Flavin, MD  Chief Complaint  Patient presents with   [facility] Course:  Mark Blevins is an 83 year old male with CHF, severe aortic stenosis, paroxysmal A-fib, CKD, who presented after a fall.  Patient reports he has been getting progressively weaker for 2 to 3 weeks and then began having frequent falls.  He reports he is fallen on his butt and is having pain in that area.  Denies any LOC.  He also endorses worsening shortness of breath for the last few weeks that is constant and nonpositional.  Patient was discharged from [facility] on 3/14 to SNF for rehab after being treated for CHF exacerbation.  Patient said he did well in rehab and was walking stairs without assistance when he left SNF. On arrival to the ED vital signs were WNL.  Labs notable for creatinine of 1.95, BUN 41.  CXR without acute pulmonary abnormality.  Pelvic x-ray without acute fracture or dislocation and sacrum or coccyx.  Patient received 500 cc bolus and was admitted for observation  Subjective: No acute events overnight.  Patient reports some improvement in his dizziness.  No notable events this morning.  Objective: Vitals:   03/25/24 1518 03/25/24 2142 03/26/24 0325 03/26/24 0729  BP: 112/67 94/61 98/64  (!) 83/56  Pulse: 92 78 61 80  Resp: 16 17 17 15   Temp: 97.8 F (36.6 C) 97.6 F (36.4 C) 98.2 F (36.8 C) 97.9 F (36.6 C)  TempSrc: Oral Oral    SpO2: 96% 96% 99% 96%  Weight:      Height:        Intake/Output Summary (Last 24 hours) at 03/26/2024 8119 Last data filed at 03/26/2024 0009 Gross per 24 hour  Intake 120 ml  Output 1 ml  Net 119 ml    Filed Weights   03/22/24 1700  Weight: 79.3 kg    Examination: General exam: Appears calm and comfortable, NAD  Respiratory system: No work of breathing, symmetric chest wall expansion Cardiovascular system: S1 & S2 heard, RRR.  Gastrointestinal system:  Abdomen soft, distended, bowel sounds present x 4 Neuro: Alert and oriented. No focal neurological deficits. Extremities: Symmetric, expected ROM Skin: No rashes, lesions Psychiatry: Demonstrates appropriate judgement and insight. Mood & affect appropriate for situation.   Assessment & Plan:  Principal Problem:   Falls    Weakness and falls - Likely multifactorial, orthostatic hypotension certainly playing a role - PT and OT recommending SNF.  Patient initially resistant but more amendable now - At baseline he resides with his 20 year old wife who is not present in the home 24/7  Orthostatic hypotension - Orthostatic vitals confirmed on 2 separate occasions - Have discontinued all diuretics and antihypertensives. - Have added low-dose 2.5 midodrine  3 times daily.  Blood pressure has improved some - Patient still experiencing some dizziness upon standing due to orthostatic drop - Have discussed orthostatic precautions with him - Continue PT/OT for gait stability as above  CKD 3A - Baseline creatinine appears to be between 1.4 and 1.8 - Creatinine 1.95 on presentation.  Status post small 500 cc bolus - Elevated creatinine likely secondary to chronic diuretic use and intermittent hypotension - Have discontinued further diuretics and antihypertensives - Creatinine has improved to baseline - Renally dosed with a creatinine clearance of 44 when needed - Avoid nephrotoxic medication and hypotension  Chronic combined systolic and diastolic CHF - Currently stable, clinically euvolemic -  Echocardiogram: EF 45 to 50%, grade 1 diastolic dysfunction, severe aortic stenosis - Outpatient cardiac visit 4/15: Coreg  discontinued, Aldactone  and furosemide  reduced. - Blood pressures as above.  Have discontinued all diuretics and antihypertensives. - No evidence of volume overload on exam.  Continue to monitor volume status closely  Severe aortic stenosis - Likely contributing to his dyspnea and  near syncope. - He plans to follow-up with Dr. Philippa Bray with [facility] to assess for candidacy of aortic valve intervention  Paroxysmal A-fib - Currently rate controlled without beta-blocker - Patient was meant to be on Eliquis  though med reconciliation reveals patient was not taking.  Will not restart Eliquis  at this time given frequent falls.  High bleeding risk   BPH - Continue low-dose Flomax   Hyponatremia - Continue monitoring.  Has received NS.  Avoid additional IV fluids if possible given history of CHF.    Normocytic anemia - Hemoglobin 10.5 - Iron studies: Low TIBC, low iron, TSAT 20.  Suspect early iron deficiency anemia.  Will start ferrous sulfate daily with Colace  Thrombocytopenia - Platelets stable. - Continue to trend CBC - No overt signs of bleeding   DVT prophylaxis: lovenox    Code Status: Full Code Disposition:   Pending SNF/rehab placement.   Consultants:    Procedures:    Antimicrobials:  Anti-infectives (From admission, onward)    None       Data Reviewed: I have personally reviewed following labs and imaging studies CBC: Recent Labs  Lab 03/22/24 0744 03/23/24 0339 03/24/24 0355 03/25/24 0544 03/26/24 0438  WBC 5.7 5.1 4.7 4.8 4.0  NEUTROABS  --   --  2.4 2.7 2.1  HGB 11.5* 10.5* 10.2* 9.9* 9.4*  HCT 34.8* 30.8* 30.5* 29.2* 27.7*  MCV 90.6 90.3 91.9 89.3 89.1  PLT 174 148* 149* 150 142*   Basic Metabolic Panel: Recent Labs  Lab 03/22/24 0744 03/23/24 0339 03/24/24 0355 03/25/24 0544 03/26/24 0438  NA 134* 133* 131* 130* 130*  K 4.8 3.9 3.8 3.7 3.6  CL 101 107 103 100 103  CO2 24 21* 22 22 21*  GLUCOSE 132* 91 80 93 115*  BUN 41* 35* 28* 25* 26*  CREATININE 1.95* 1.56* 1.41* 1.30* 1.37*  CALCIUM  9.1 8.4* 8.3* 8.5* 8.4*  MG  --  2.2 2.1 2.0 2.1  PHOS  --   --  3.2 3.1 3.0   GFR: Estimated Creatinine Clearance: 44.1 mL/min (A) (by C-G formula based on SCr of 1.37 mg/dL (H)). Liver Function Tests: Recent Labs  Lab  03/22/24 0744 03/24/24 0355 03/25/24 0544 03/26/24 0438  AST 24 19 19 18   ALT 19 14 14 13   ALKPHOS 80 65 62 62  BILITOT 0.9 1.0 1.1 1.0  PROT 7.4 6.2* 6.2* 6.1*  ALBUMIN 3.3* 2.8* 2.8* 2.8*   CBG: No results for input(s): "GLUCAP" in the last 168 hours.  No results found for this or any previous visit (from the past 240 hours).   Radiology Studies: No results found.   Scheduled Meds:  enoxaparin  (LOVENOX ) injection  40 mg Subcutaneous Q24H   lidocaine   1 patch Transdermal Q24H   midodrine   2.5 mg Oral TID WC   tamsulosin   0.4 mg Oral QPC supper   Continuous Infusions:   LOS: 0 days  MDM: Patient is high risk for one or more organ failure.  They necessitate ongoing hospitalization for continued IV therapies and subsequent lab monitoring. Total time spent interpreting labs and vitals, reviewing the medical record, coordinating care amongst consultants  and care team members, directly assessing and discussing care with the patient and/or family: 55 min  Mark Cordial, DO [facility]  To contact the attending physician between 7A-7P please use Epic Chat. To contact the covering physician during after hours 7P-7A, please review Amion.  03/26/2024, 9:09 AM   *This document has been created with the assistance of dictation software. Please excuse typographical errors. *

## 2024-03-27 DIAGNOSIS — R296 Repeated falls: Secondary | ICD-10-CM | POA: Diagnosis not present

## 2024-03-27 LAB — CBC WITH DIFFERENTIAL/PLATELET
Abs Immature Granulocytes: 0.03 10*3/uL (ref 0.00–0.07)
Basophils Absolute: 0 10*3/uL (ref 0.0–0.1)
Basophils Relative: 1 %
Eosinophils Absolute: 0.2 10*3/uL (ref 0.0–0.5)
Eosinophils Relative: 4 %
HCT: 26.1 % — ABNORMAL LOW (ref 39.0–52.0)
Hemoglobin: 9.2 g/dL — ABNORMAL LOW (ref 13.0–17.0)
Immature Granulocytes: 1 %
Lymphocytes Relative: 28 %
Lymphs Abs: 1.2 10*3/uL (ref 0.7–4.0)
MCH: 31.2 pg (ref 26.0–34.0)
MCHC: 35.2 g/dL (ref 30.0–36.0)
MCV: 88.5 fL (ref 80.0–100.0)
Monocytes Absolute: 0.5 10*3/uL (ref 0.1–1.0)
Monocytes Relative: 11 %
Neutro Abs: 2.4 10*3/uL (ref 1.7–7.7)
Neutrophils Relative %: 55 %
Platelets: 132 10*3/uL — ABNORMAL LOW (ref 150–400)
RBC: 2.95 MIL/uL — ABNORMAL LOW (ref 4.22–5.81)
RDW: 17.8 % — ABNORMAL HIGH (ref 11.5–15.5)
WBC: 4.4 10*3/uL (ref 4.0–10.5)
nRBC: 0 % (ref 0.0–0.2)

## 2024-03-27 LAB — COMPREHENSIVE METABOLIC PANEL WITH GFR
ALT: 15 U/L (ref 0–44)
AST: 18 U/L (ref 15–41)
Albumin: 2.8 g/dL — ABNORMAL LOW (ref 3.5–5.0)
Alkaline Phosphatase: 67 U/L (ref 38–126)
Anion gap: 7 (ref 5–15)
BUN: 23 mg/dL (ref 8–23)
CO2: 22 mmol/L (ref 22–32)
Calcium: 8.4 mg/dL — ABNORMAL LOW (ref 8.9–10.3)
Chloride: 101 mmol/L (ref 98–111)
Creatinine, Ser: 1.31 mg/dL — ABNORMAL HIGH (ref 0.61–1.24)
GFR, Estimated: 54 mL/min — ABNORMAL LOW (ref >60)
Glucose, Bld: 122 mg/dL — ABNORMAL HIGH (ref 70–99)
Potassium: 3.6 mmol/L (ref 3.5–5.1)
Sodium: 130 mmol/L — ABNORMAL LOW (ref 135–145)
Total Bilirubin: 1.2 mg/dL (ref 0.0–1.2)
Total Protein: 6.1 g/dL — ABNORMAL LOW (ref 6.5–8.1)

## 2024-03-27 LAB — PHOSPHORUS: Phosphorus: 3 mg/dL (ref 2.5–4.6)

## 2024-03-27 LAB — MAGNESIUM: Magnesium: 1.9 mg/dL (ref 1.7–2.4)

## 2024-03-27 MED ORDER — MIDODRINE HCL 5 MG PO TABS
5.0000 mg | ORAL_TABLET | Freq: Three times a day (TID) | ORAL | Status: DC
  Administered 2024-03-27 – 2024-03-30 (×10): 5 mg via ORAL
  Filled 2024-03-27 (×10): qty 1

## 2024-03-27 NOTE — Progress Notes (Addendum)
 Please note * this was accidentally addended. Please see earlier version of note to reflect progress from 5/19.    PROGRESS NOTE    Mark Blevins  ZOX:[id number] DOB: 1940/11/15 DOA: 03/22/2024 PCP: Theron Flavin, MD  Chief Complaint  Patient presents with   [facility] Course:  Mark Blevins is an 83 year old male with CHF, severe aortic stenosis, paroxysmal A-fib, CKD, who presented after a fall.  Patient reports he has been getting progressively weaker for 2 to 3 weeks and then began having frequent falls.  He reports he is fallen on his butt and is having pain in that area.  Denies any LOC.  He also endorses worsening shortness of breath for the last few weeks that is constant and nonpositional.  Patient was discharged from [facility] on 3/14 to SNF for rehab after being treated for CHF exacerbation.  Patient said he did well in rehab and was walking stairs without assistance when he left SNF. On arrival to the ED vital signs were WNL.  Labs notable for creatinine of 1.95, BUN 41.  CXR without acute pulmonary abnormality.  Pelvic x-ray without acute fracture or dislocation and sacrum or coccyx.  Patient received 500 cc bolus and was admitted for observation  Subjective: No acute events overnight.  Patient is denying dizziness this morning.  He did work with physical therapy and blood pressure dropped to 66/56 with associated Near syncopal episode   Objective: Vitals:   03/26/24 1454 03/26/24 2034 03/27/24 0449 03/27/24 0716  BP: 98/65 91/67 (!) 91/56 97/66  Pulse: 77 78 67 80  Resp: 16 20 18 16   Temp: 98.2 F (36.8 C) 98.4 F (36.9 C) 97.9 F (36.6 C) 98.4 F (36.9 C)  TempSrc: Oral Oral Oral   SpO2: 96% 97% 96% 100%  Weight:      Height:        Intake/Output Summary (Last 24 hours) at 03/27/2024 1349 Last data filed at 03/27/2024 0930 Gross per 24 hour  Intake 480 ml  Output --  Net 480 ml    Filed Weights   03/22/24 1700  Weight: 79.3 kg    Examination: General  exam: Appears calm and comfortable, NAD  Respiratory system: No work of breathing, symmetric chest wall expansion Cardiovascular system: S1 & S2 heard, RRR.  Gastrointestinal system: Abdomen soft, very distended, bowel sounds present x 4 Neuro: Alert and oriented. No focal neurological deficits. Extremities: Symmetric, expected ROM Skin: No rashes, lesions Psychiatry: Demonstrates appropriate judgement and insight. Mood & affect appropriate for situation.   Assessment & Plan:  Principal Problem:   Falls    Weakness and falls - Likely multifactorial, orthostatic hypotension certainly playing a role - Dizziness upon standing is improving since the addition of midodrine  but blood pressure remains severely orthostatic.  101/55.9, down to 65/56 with >40bpm HR increase - Continue with PT/OT daily. - TOC working on SNF placement - At baseline he resides with his 37 year old wife who is not present in the home 24/7.  His son has significant concerns about discharging home  Orthostatic hypotension - Still profoundly orthostatic, despite midodrine . - TED hose ordered - Will add fludrocortisone  to start tomorrow a.m. - Continue with midodrine  - Patient is hyponatremic today.  Given CHF will avoid salt tabs but encourage dietary salt intake.  No evidence of volume overload today. - Continue orthostatic precautions - Continue PT/OT for gait instability  CKD 3A - Baseline creatinine appears to be between 1.4 and 1.8 - Creatinine  1.95 on presentation.  Status post small 500 cc bolus - Elevated creatinine likely secondary to chronic diuretic use and intermittent hypotension - Have discontinued all diuretics and antihypertensives - Creatinine improved beyond baseline - Renally dosed with a creatinine clearance of 47 as needed - Avoid hypotension Avoid nephrotoxic meds  Chronic combined systolic and diastolic CHF - Currently stable, clinically euvolemic - Echocardiogram: EF 45 to 50%, grade  1 diastolic dysfunction, severe aortic stenosis - Outpatient cardiac visit 4/15: Coreg  discontinued, Aldactone  and furosemide  reduced. - Have discontinued all antihypertensives as above continue to monitor volume status closely.  No pedal edema, pulmonary edema appreciated - Patient's abdomen does appear distended.  He may be experiencing hepatic congestion  Hyponatremia - Chronic.  Some acute worsening today.  Has received NS this admission -- Repeat CMP in a.m with sodium studies - Encourage salt in diet, have liberalized diet orders   Severe aortic stenosis - Likely contributing to his dyspnea and near syncope. - He plans to follow-up with Dr. Philippa Bray with [facility] to assess for candidacy of aortic valve intervention  Paroxysmal A-fib - Currently rate controlled without beta-blocker - Patient was meant to be on Eliquis  though med reconciliation reveals patient was not taking.  Will not restart Eliquis  at this time given frequent falls.  High bleeding risk   BPH - Continue low-dose Flomax   Normocytic anemia - Hemoglobin downtrending.  No acute blood loss noted. - Downtrend may be secondary to volume overload. - Baseline Hemoglobin around 11 - No melena, no BRBPR - Last colonoscopy 2020 - Iron studies: Low TIBC, low iron, TSAT 20.  Suspect early iron deficiency anemia.  Started ferrous sulfate  daily with Colace  Thrombocytopenia - Platelets appear stable around 130, appears baseline around 170. - No overt signs of bleeding - Continue to trend CBC closely.  Distended abdomen, hepatic congestion? - Patient reports that this is chronic.  He does not believe it is worse than normal.  It is soft but appears larger than prior - Possibility that patient is retaining fluid in his abdomen as result of his heart failure.  No other evidence of volume overload in extremities or lungs.  Labs today consistent with volume overload (hyponatremia, downtrending hemoglobin) - Have ordered abdominal  ultrasound to evaluate this distention.  No history of liver disease - Patient's blood pressure unlikely to tolerate diuretics  DVT prophylaxis: lovenox    Code Status: Full Code Disposition:   Pending SNF/rehab placement.   Consultants:    Procedures:    Antimicrobials:  Anti-infectives (From admission, onward)    None       Data Reviewed: I have personally reviewed following labs and imaging studies CBC: Recent Labs  Lab 03/23/24 0339 03/24/24 0355 03/25/24 0544 03/26/24 0438 03/27/24 0445  WBC 5.1 4.7 4.8 4.0 4.4  NEUTROABS  --  2.4 2.7 2.1 2.4  HGB 10.5* 10.2* 9.9* 9.4* 9.2*  HCT 30.8* 30.5* 29.2* 27.7* 26.1*  MCV 90.3 91.9 89.3 89.1 88.5  PLT 148* 149* 150 142* 132*   Basic Metabolic Panel: Recent Labs  Lab 03/23/24 0339 03/24/24 0355 03/25/24 0544 03/26/24 0438 03/27/24 0445  NA 133* 131* 130* 130* 130*  K 3.9 3.8 3.7 3.6 3.6  CL 107 103 100 103 101  CO2 21* 22 22 21* 22  GLUCOSE 91 80 93 115* 122*  BUN 35* 28* 25* 26* 23  CREATININE 1.56* 1.41* 1.30* 1.37* 1.31*  CALCIUM  8.4* 8.3* 8.5* 8.4* 8.4*  MG 2.2 2.1 2.0 2.1 1.9  PHOS  --  3.2 3.1 3.0 3.0   GFR: Estimated Creatinine Clearance: 46.1 mL/min (A) (by C-G formula based on SCr of 1.31 mg/dL (H)). Liver Function Tests: Recent Labs  Lab 03/22/24 0744 03/24/24 0355 03/25/24 0544 03/26/24 0438 03/27/24 0445  AST 24 19 19 18 18   ALT 19 14 14 13 15   ALKPHOS 80 65 62 62 67  BILITOT 0.9 1.0 1.1 1.0 1.2  PROT 7.4 6.2* 6.2* 6.1* 6.1*  ALBUMIN 3.3* 2.8* 2.8* 2.8* 2.8*   CBG: No results for input(s): "GLUCAP" in the last 168 hours.  No results found for this or any previous visit (from the past 240 hours).   Radiology Studies: No results found.   Scheduled Meds:  docusate sodium   100 mg Oral Daily   enoxaparin  (LOVENOX ) injection  40 mg Subcutaneous Q24H   ferrous sulfate   325 mg Oral Q breakfast   lidocaine   1 patch Transdermal Q24H   midodrine   5 mg Oral TID WC   tamsulosin   0.4 mg  Oral QPC supper   Continuous Infusions:   LOS: 0 days  MDM: Patient is high risk for one or more organ failure.  They necessitate ongoing hospitalization for continued IV therapies and subsequent lab monitoring. Total time spent interpreting labs and vitals, reviewing the medical record, coordinating care amongst consultants and care team members, directly assessing and discussing care with the patient and/or family: 55 min  Jaye Polidori, DO [facility]  To contact the attending physician between 7A-7P please use Epic Chat. To contact the covering physician during after hours 7P-7A, please review Amion.  03/27/2024, 1:49 PM   *This document has been created with the assistance of dictation software. Please excuse typographical errors. *

## 2024-03-27 NOTE — Plan of Care (Signed)
   Problem: Education: Goal: Knowledge of General Education information will improve Description Including pain rating scale, medication(s)/side effects and non-pharmacologic comfort measures Outcome: Progressing

## 2024-03-28 ENCOUNTER — Observation Stay

## 2024-03-28 DIAGNOSIS — I350 Nonrheumatic aortic (valve) stenosis: Secondary | ICD-10-CM

## 2024-03-28 DIAGNOSIS — W19XXXA Unspecified fall, initial encounter: Secondary | ICD-10-CM | POA: Insufficient documentation

## 2024-03-28 DIAGNOSIS — R42 Dizziness and giddiness: Secondary | ICD-10-CM | POA: Insufficient documentation

## 2024-03-28 LAB — CBC WITH DIFFERENTIAL/PLATELET
Abs Immature Granulocytes: 0.03 10*3/uL (ref 0.00–0.07)
Basophils Absolute: 0 10*3/uL (ref 0.0–0.1)
Basophils Relative: 1 %
Eosinophils Absolute: 0.4 10*3/uL (ref 0.0–0.5)
Eosinophils Relative: 8 %
HCT: 25.1 % — ABNORMAL LOW (ref 39.0–52.0)
Hemoglobin: 8.5 g/dL — ABNORMAL LOW (ref 13.0–17.0)
Immature Granulocytes: 1 %
Lymphocytes Relative: 29 %
Lymphs Abs: 1.4 10*3/uL (ref 0.7–4.0)
MCH: 30.6 pg (ref 26.0–34.0)
MCHC: 33.9 g/dL (ref 30.0–36.0)
MCV: 90.3 fL (ref 80.0–100.0)
Monocytes Absolute: 0.5 10*3/uL (ref 0.1–1.0)
Monocytes Relative: 11 %
Neutro Abs: 2.5 10*3/uL (ref 1.7–7.7)
Neutrophils Relative %: 50 %
Platelets: 139 10*3/uL — ABNORMAL LOW (ref 150–400)
RBC: 2.78 MIL/uL — ABNORMAL LOW (ref 4.22–5.81)
RDW: 18 % — ABNORMAL HIGH (ref 11.5–15.5)
WBC: 4.8 10*3/uL (ref 4.0–10.5)
nRBC: 0 % (ref 0.0–0.2)

## 2024-03-28 LAB — COMPREHENSIVE METABOLIC PANEL WITH GFR
ALT: 14 U/L (ref 0–44)
AST: 20 U/L (ref 15–41)
Albumin: 2.7 g/dL — ABNORMAL LOW (ref 3.5–5.0)
Alkaline Phosphatase: 64 U/L (ref 38–126)
Anion gap: 6 (ref 5–15)
BUN: 22 mg/dL (ref 8–23)
CO2: 22 mmol/L (ref 22–32)
Calcium: 8.2 mg/dL — ABNORMAL LOW (ref 8.9–10.3)
Chloride: 100 mmol/L (ref 98–111)
Creatinine, Ser: 1.28 mg/dL — ABNORMAL HIGH (ref 0.61–1.24)
GFR, Estimated: 55 mL/min — ABNORMAL LOW (ref >60)
Glucose, Bld: 110 mg/dL — ABNORMAL HIGH (ref 70–99)
Potassium: 3.4 mmol/L — ABNORMAL LOW (ref 3.5–5.1)
Sodium: 128 mmol/L — ABNORMAL LOW (ref 135–145)
Total Bilirubin: 1.2 mg/dL (ref 0.0–1.2)
Total Protein: 6 g/dL — ABNORMAL LOW (ref 6.5–8.1)

## 2024-03-28 LAB — MAGNESIUM: Magnesium: 2 mg/dL (ref 1.7–2.4)

## 2024-03-28 LAB — PHOSPHORUS: Phosphorus: 2.5 mg/dL (ref 2.5–4.6)

## 2024-03-28 MED ORDER — POTASSIUM CHLORIDE CRYS ER 20 MEQ PO TBCR
40.0000 meq | EXTENDED_RELEASE_TABLET | Freq: Once | ORAL | Status: AC
  Administered 2024-03-28: 40 meq via ORAL
  Filled 2024-03-28: qty 2

## 2024-03-28 MED ORDER — FLUDROCORTISONE ACETATE 0.1 MG PO TABS
0.1000 mg | ORAL_TABLET | Freq: Every day | ORAL | Status: DC
  Administered 2024-03-29 – 2024-03-30 (×2): 0.1 mg via ORAL
  Filled 2024-03-28 (×2): qty 1

## 2024-03-28 NOTE — Progress Notes (Signed)
 Physical Therapy Treatment Patient Details Name: Mark Blevins MRN: [id number] DOB: 05/18/1941 Today's Date: 03/28/2024   History of Present Illness presented to ER secondary to recurrent falls, progressive weakness; admitted for management of AKI (related to dehydration?)    PT Comments  Pt received in bed. Session focused on BP assessment in all 3 positions while focusing on safe mobility and education to prevent future falls. Overall pt's strength is without significant decline since admission. With primary functional limitations due to significant drop in BP with positional changes and symptomatic.  Supine:  101/55 (70)  HR 68 Sitting:     86/67 (76)  HR 81 Standing: 65/56 (63)  HR 101  - only lasting 20 seconds in standing and symptomatic. Nursing/MD notified. Pt encouraged to use BSC and limit gait distance until resolved. Education provided on recognizing symptoms to prevent future falls due to drop in BP. Pt positioned in recliner, all needs in reach.   If plan is discharge home, recommend the following: A little help with walking and/or transfers;A little help with bathing/dressing/bathroom   Can travel by private vehicle     Yes  Equipment Recommendations  Other (comment) (Pt requesting HHPT, no DME if BP resolves)    Recommendations for Other Services       Precautions / Restrictions Precautions Precautions: Fall Recall of Precautions/Restrictions: Intact Restrictions Weight Bearing Restrictions Per Provider Order: No     Mobility  Bed Mobility Overal bed mobility: Needs Assistance Bed Mobility: Supine to Sit, Sit to Supine     Supine to sit: Supervision, Used rails     General bed mobility comments: Heavy reliance on side rail with increased time due to sacral discomfort    Transfers Overall transfer level: Needs assistance Equipment used: Rolling walker (2 wheels) Transfers: Sit to/from Stand, Bed to chair/wheelchair/BSC Sit to Stand: Contact guard  assist   Step pivot transfers: Supervision       General transfer comment: CGA for safety for standing from bed and from toilet    Ambulation/Gait Ambulation/Gait assistance: Contact guard assist Gait Distance (Feet): 15 Feet Assistive device: Rolling walker (2 wheels) Gait Pattern/deviations: Decreased stride length, Trunk flexed Gait velocity: decreased     General Gait Details: Pt uses RW at baseline. Gait limited due to quick drop in BP in standing   Stairs             Wheelchair Mobility     Tilt Bed    Modified Rankin (Stroke Patients Only)       Balance Overall balance assessment: Needs assistance Sitting-balance support: No upper extremity supported, Feet supported Sitting balance-Leahy Scale: Good     Standing balance support: Bilateral upper extremity supported Standing balance-Leahy Scale: Fair Standing balance comment: Pt able to side step chair<>BSC without AD with Supervision                            Communication Communication Communication: No apparent difficulties Factors Affecting Communication: Hearing impaired  Cognition Arousal: Alert Behavior During Therapy: WFL for tasks assessed/performed                           PT - Cognition Comments: cues for safety to monitor for sings for need of rest breaks/fall prevention Following commands: Intact      Cueing Cueing Techniques: Verbal cues  Exercises Other Exercises Other Exercises: +Orthostatic Hypotension primarily in standing within 20 seconds, vitals  recorded in chart. MD/Nursing notified    General Comments General comments (skin integrity, edema, etc.): Discussed prior LOF and recent falls at home which pt attributes to low BP. Education on recognizing personal symptoms of BP dropping and preparing to sit.      Pertinent Vitals/Pain Pain Assessment Pain Assessment: 0-10 Pain Score: 3  Pain Location: sacrum due to recent fall Pain Descriptors /  Indicators: Aching, Discomfort Pain Intervention(s): Monitored during session, Repositioned    Home Living                          Prior Function            PT Goals (current goals can now be found in the care plan section) Acute Rehab PT Goals Patient Stated Goal: to have better BP and go home    Frequency    Min 2X/week      PT Plan      Co-evaluation              AM-PAC PT "6 Clicks" Mobility   Outcome Measure  Help needed turning from your back to your side while in a flat bed without using bedrails?: None Help needed moving from lying on your back to sitting on the side of a flat bed without using bedrails?: A Little Help needed moving to and from a bed to a chair (including a wheelchair)?: A Little Help needed standing up from a chair using your arms (e.g., wheelchair or bedside chair)?: A Little Help needed to walk in hospital room?: A Little Help needed climbing 3-5 steps with a railing? : A Lot 6 Click Score: 18    End of Session Equipment Utilized During Treatment: Gait belt Activity Tolerance: Other (comment) (Pt limited by Low BP in standing) Patient left: in chair;with call bell/phone within reach;with chair alarm set;with family/visitor present Nurse Communication: Mobility status;Precautions (+ Orthostatic Hypotension) PT Visit Diagnosis: Muscle weakness (generalized) (M62.81);Difficulty in walking, not elsewhere classified (R26.2);Pain Pain - part of body:  (Sacrum)     Time: 1610-9604 PT Time Calculation (min) (ACUTE ONLY): 54 min  Charges:    $Gait Training: 8-22 mins $Therapeutic Exercise: 8-22 mins $Therapeutic Activity: 23-37 mins PT General Charges $$ ACUTE PT VISIT: 1 Visit                    Melvyn Stagers, PTA  [name] 03/28/2024, 2:41 PM

## 2024-03-28 NOTE — TOC Progression Note (Signed)
 Transition of Care [facility]) - Progression Note    Patient Details  Name: Mark Blevins MRN: [id number] Date of Birth: [birth year]  [facility] CM/SW Contact  Alexandra Ice, RN Phone Number: 03/28/2024, 1:05 PM  Clinical Narrative:    TOC met with patient to discuss discharge plan, now SNF for additional rehab. Provided him with SNF bed offers. He will review, TOC will follow up with him later for choice.        Expected Discharge Plan and Services                                               Social Determinants of Health (SDOH) Interventions SDOH Screenings   Food Insecurity: No Food Insecurity (03/22/2024)  Housing: Low Risk  (03/22/2024)  Transportation Needs: No Transportation Needs (03/22/2024)  Utilities: Not At Risk (03/22/2024)  Alcohol Screen: Low Risk  (08/22/2021)  Depression (PHQ2-9): Low Risk  (01/13/2022)  Financial Resource Strain: Low Risk  (08/22/2021)  Physical Activity: Insufficiently Active (08/22/2021)  Social Connections: Socially Integrated (03/22/2024)  Stress: No Stress Concern Present (08/22/2021)  Tobacco Use: High Risk (03/22/2024)    Readmission Risk Interventions     No data to display

## 2024-03-28 NOTE — Plan of Care (Signed)
  Problem: Activity: Goal: Risk for activity intolerance will decrease Outcome: Progressing   Problem: Nutrition: Goal: Adequate nutrition will be maintained Outcome: Progressing   Problem: Pain Managment: Goal: General experience of comfort will improve and/or be controlled Outcome: Progressing   Problem: Skin Integrity: Goal: Risk for impaired skin integrity will decrease Outcome: Progressing

## 2024-03-28 NOTE — Progress Notes (Signed)
 PROGRESS NOTE    Mark Blevins  ZOX:[id number] DOB: Jul 23, 1941 DOA: 03/22/2024 PCP: Theron Flavin, MD  Chief Complaint  Patient presents with   [facility] Course:  BRACK SHADDOCK is an 83 year old male with CHF, severe aortic stenosis, paroxysmal A-fib, CKD, who presented after a fall.  Patient reports he has been getting progressively weaker for 2 to 3 weeks and then began having frequent falls.  He reports he is fallen on his butt and is having pain in that area.  Denies any LOC.  He also endorses worsening shortness of breath for the last few weeks that is constant and nonpositional.  Patient was discharged from [facility] on 3/14 to SNF for rehab after being treated for CHF exacerbation.  Patient said he did well in rehab and was walking stairs without assistance when he left SNF. On arrival to the ED vital signs were WNL.  Labs notable for creatinine of 1.95, BUN 41.  CXR without acute pulmonary abnormality.  Pelvic x-ray without acute fracture or dislocation and sacrum or coccyx.  Patient received 500 cc bolus and was admitted for observation  Subjective: No acute events overnight.  Patient is denying dizziness this morning.  He did work with physical therapy and blood pressure dropped to 66/56 with associated Near syncopal episode   Objective: Vitals:   03/27/24 1603 03/27/24 2040 03/28/24 0456 03/28/24 1546  BP: (!) 89/53 (!) 83/60 108/67 94/66  Pulse: 66 68 (!) 55 78  Resp: 16 19 16 16   Temp: 98.1 F (36.7 C) (!) 97.5 F (36.4 C) 98.1 F (36.7 C) 98.4 F (36.9 C)  TempSrc:  Oral    SpO2: 98% 99% 98% 100%  Weight:      Height:        Intake/Output Summary (Last 24 hours) at 03/28/2024 1717 Last data filed at 03/28/2024 1500 Gross per 24 hour  Intake 600 ml  Output --  Net 600 ml    Filed Weights   03/22/24 1700  Weight: 79.3 kg    Examination: General exam: Appears calm and comfortable, NAD  Respiratory system: No work of breathing, symmetric chest wall  expansion Cardiovascular system: S1 & S2 heard, RRR.  Gastrointestinal system: Abdomen soft, very distended, bowel sounds present x 4 Neuro: Alert and oriented. No focal neurological deficits. Extremities: Symmetric, expected ROM Skin: No rashes, lesions Psychiatry: Demonstrates appropriate judgement and insight. Mood & affect appropriate for situation.   Assessment & Plan:  Principal Problem:   Falls Active Problems:   Fall   Orthostatic dizziness    Weakness and falls - Likely multifactorial, orthostatic hypotension certainly playing a role - Dizziness upon standing is improving since the addition of midodrine  but blood pressure remains severely orthostatic.  101/55.9, down to 65/56 with >40bpm HR increase - Continue with PT/OT daily. - TOC working on SNF placement - At baseline he resides with his 26 year old wife who is not present in the home 24/7.  His son has significant concerns about discharging home  Orthostatic hypotension - Still profoundly orthostatic, despite midodrine . - TED hose ordered - Will add fludrocortisone  to start tomorrow a.m. - Continue with midodrine  - Patient is hyponatremic today.  Given CHF will avoid salt tabs but encourage dietary salt intake.  No evidence of volume overload today. - Continue orthostatic precautions - Continue PT/OT for gait instability  CKD 3A - Baseline creatinine appears to be between 1.4 and 1.8 - Creatinine 1.95 on presentation.  Status post small 500 cc  bolus - Elevated creatinine likely secondary to chronic diuretic use and intermittent hypotension - Have discontinued all diuretics and antihypertensives - Creatinine improved beyond baseline - Renally dosed with a creatinine clearance of 47 as needed - Avoid hypotension Avoid nephrotoxic meds  Chronic combined systolic and diastolic CHF - Currently stable, clinically euvolemic - Echocardiogram: EF 45 to 50%, grade 1 diastolic dysfunction, severe aortic stenosis -  Outpatient cardiac visit 4/15: Coreg  discontinued, Aldactone  and furosemide  reduced. - Have discontinued all antihypertensives as above continue to monitor volume status closely.  No pedal edema, pulmonary edema appreciated - Patient's abdomen does appear distended.  He may be experiencing hepatic congestion  Hyponatremia - Chronic.  Some acute worsening today.  Has received NS this admission -- Repeat CMP in a.m with sodium studies - Encourage salt in diet, have liberalized diet orders   Severe aortic stenosis - Likely contributing to his dyspnea and near syncope. - He plans to follow-up with Dr. Philippa Bray with [facility] to assess for candidacy of aortic valve intervention  Paroxysmal A-fib - Currently rate controlled without beta-blocker - Patient was meant to be on Eliquis  though med reconciliation reveals patient was not taking.  Will not restart Eliquis  at this time given frequent falls.  High bleeding risk   BPH - Continue low-dose Flomax   Normocytic anemia - Hemoglobin downtrending.  No acute blood loss noted. - Downtrend may be secondary to volume overload. - Baseline Hemoglobin around 11 - No melena, no BRBPR - Last colonoscopy 2020 - Iron studies: Low TIBC, low iron, TSAT 20.  Suspect early iron deficiency anemia.  Started ferrous sulfate  daily with Colace  Thrombocytopenia - Platelets appear stable around 130, appears baseline around 170. - No overt signs of bleeding - Continue to trend CBC closely.  Distended abdomen, hepatic congestion? - Patient reports that this is chronic.  He does not believe it is worse than normal.  It is soft but appears larger than prior - Possibility that patient is retaining fluid in his abdomen as result of his heart failure.  No other evidence of volume overload in extremities or lungs.  Labs today consistent with volume overload (hyponatremia, downtrending hemoglobin) - Have ordered abdominal ultrasound to evaluate this distention.  No history of  liver disease - Patient's blood pressure unlikely to tolerate diuretics  DVT prophylaxis: lovenox    Code Status: Full Code Disposition:   Pending SNF/rehab placement.   Consultants:    Procedures:    Antimicrobials:  Anti-infectives (From admission, onward)    None       Data Reviewed: I have personally reviewed following labs and imaging studies CBC: Recent Labs  Lab 03/24/24 0355 03/25/24 0544 03/26/24 0438 03/27/24 0445 03/28/24 0233  WBC 4.7 4.8 4.0 4.4 4.8  NEUTROABS 2.4 2.7 2.1 2.4 2.5  HGB 10.2* 9.9* 9.4* 9.2* 8.5*  HCT 30.5* 29.2* 27.7* 26.1* 25.1*  MCV 91.9 89.3 89.1 88.5 90.3  PLT 149* 150 142* 132* 139*   Basic Metabolic Panel: Recent Labs  Lab 03/24/24 0355 03/25/24 0544 03/26/24 0438 03/27/24 0445 03/28/24 0233  NA 131* 130* 130* 130* 128*  K 3.8 3.7 3.6 3.6 3.4*  CL 103 100 103 101 100  CO2 22 22 21* 22 22  GLUCOSE 80 93 115* 122* 110*  BUN 28* 25* 26* 23 22  CREATININE 1.41* 1.30* 1.37* 1.31* 1.28*  CALCIUM  8.3* 8.5* 8.4* 8.4* 8.2*  MG 2.1 2.0 2.1 1.9 2.0  PHOS 3.2 3.1 3.0 3.0 2.5   GFR: Estimated Creatinine Clearance:  47.2 mL/min (A) (by C-G formula based on SCr of 1.28 mg/dL (H)). Liver Function Tests: Recent Labs  Lab 03/24/24 0355 03/25/24 0544 03/26/24 0438 03/27/24 0445 03/28/24 0233  AST 19 19 18 18 20   ALT 14 14 13 15 14   ALKPHOS 65 62 62 67 64  BILITOT 1.0 1.1 1.0 1.2 1.2  PROT 6.2* 6.2* 6.1* 6.1* 6.0*  ALBUMIN 2.8* 2.8* 2.8* 2.8* 2.7*   CBG: No results for input(s): "GLUCAP" in the last 168 hours.  No results found for this or any previous visit (from the past 240 hours).   Radiology Studies: No results found.   Scheduled Meds:  docusate sodium   100 mg Oral Daily   enoxaparin  (LOVENOX ) injection  40 mg Subcutaneous Q24H   ferrous sulfate   325 mg Oral Q breakfast   [START ON 03/29/2024] fludrocortisone   0.1 mg Oral Daily   lidocaine   1 patch Transdermal Q24H   midodrine   5 mg Oral TID WC   tamsulosin   0.4  mg Oral QPC supper   Continuous Infusions:   LOS: 0 days  MDM: Patient is high risk for one or more organ failure.  They necessitate ongoing hospitalization for continued IV therapies and subsequent lab monitoring. Total time spent interpreting labs and vitals, reviewing the medical record, coordinating care amongst consultants and care team members, directly assessing and discussing care with the patient and/or family: 55 min  Marcianna Daily, DO [facility]  To contact the attending physician between 7A-7P please use Epic Chat. To contact the covering physician during after hours 7P-7A, please review Amion.  03/28/2024, 5:17 PM   *This document has been created with the assistance of dictation software. Please excuse typographical errors. *

## 2024-03-29 DIAGNOSIS — Z9841 Cataract extraction status, right eye: Secondary | ICD-10-CM | POA: Diagnosis not present

## 2024-03-29 DIAGNOSIS — D649 Anemia, unspecified: Secondary | ICD-10-CM | POA: Diagnosis present

## 2024-03-29 DIAGNOSIS — Z961 Presence of intraocular lens: Secondary | ICD-10-CM | POA: Diagnosis present

## 2024-03-29 DIAGNOSIS — I5042 Chronic combined systolic (congestive) and diastolic (congestive) heart failure: Secondary | ICD-10-CM | POA: Diagnosis present

## 2024-03-29 DIAGNOSIS — I4819 Other persistent atrial fibrillation: Secondary | ICD-10-CM | POA: Diagnosis present

## 2024-03-29 DIAGNOSIS — D696 Thrombocytopenia, unspecified: Secondary | ICD-10-CM | POA: Diagnosis present

## 2024-03-29 DIAGNOSIS — I130 Hypertensive heart and chronic kidney disease with heart failure and stage 1 through stage 4 chronic kidney disease, or unspecified chronic kidney disease: Secondary | ICD-10-CM | POA: Diagnosis present

## 2024-03-29 DIAGNOSIS — I951 Orthostatic hypotension: Secondary | ICD-10-CM | POA: Diagnosis present

## 2024-03-29 DIAGNOSIS — N179 Acute kidney failure, unspecified: Secondary | ICD-10-CM | POA: Diagnosis present

## 2024-03-29 DIAGNOSIS — I428 Other cardiomyopathies: Secondary | ICD-10-CM | POA: Diagnosis present

## 2024-03-29 DIAGNOSIS — R296 Repeated falls: Secondary | ICD-10-CM | POA: Diagnosis present

## 2024-03-29 DIAGNOSIS — Z79899 Other long term (current) drug therapy: Secondary | ICD-10-CM | POA: Diagnosis not present

## 2024-03-29 DIAGNOSIS — R2681 Unsteadiness on feet: Secondary | ICD-10-CM | POA: Diagnosis present

## 2024-03-29 DIAGNOSIS — Z9049 Acquired absence of other specified parts of digestive tract: Secondary | ICD-10-CM | POA: Diagnosis not present

## 2024-03-29 DIAGNOSIS — I350 Nonrheumatic aortic (valve) stenosis: Secondary | ICD-10-CM | POA: Diagnosis present

## 2024-03-29 DIAGNOSIS — N400 Enlarged prostate without lower urinary tract symptoms: Secondary | ICD-10-CM | POA: Diagnosis present

## 2024-03-29 DIAGNOSIS — R42 Dizziness and giddiness: Secondary | ICD-10-CM | POA: Diagnosis not present

## 2024-03-29 DIAGNOSIS — E860 Dehydration: Secondary | ICD-10-CM | POA: Diagnosis present

## 2024-03-29 DIAGNOSIS — R140 Abdominal distension (gaseous): Secondary | ICD-10-CM | POA: Diagnosis present

## 2024-03-29 DIAGNOSIS — T502X5A Adverse effect of carbonic-anhydrase inhibitors, benzothiadiazides and other diuretics, initial encounter: Secondary | ICD-10-CM | POA: Diagnosis present

## 2024-03-29 DIAGNOSIS — Z96653 Presence of artificial knee joint, bilateral: Secondary | ICD-10-CM | POA: Diagnosis present

## 2024-03-29 DIAGNOSIS — Z7901 Long term (current) use of anticoagulants: Secondary | ICD-10-CM | POA: Diagnosis not present

## 2024-03-29 DIAGNOSIS — W19XXXA Unspecified fall, initial encounter: Secondary | ICD-10-CM | POA: Diagnosis present

## 2024-03-29 DIAGNOSIS — N1831 Chronic kidney disease, stage 3a: Secondary | ICD-10-CM | POA: Diagnosis present

## 2024-03-29 DIAGNOSIS — E871 Hypo-osmolality and hyponatremia: Secondary | ICD-10-CM | POA: Diagnosis present

## 2024-03-29 DIAGNOSIS — R2689 Other abnormalities of gait and mobility: Secondary | ICD-10-CM | POA: Diagnosis present

## 2024-03-29 LAB — COMPREHENSIVE METABOLIC PANEL WITH GFR
ALT: 15 U/L (ref 0–44)
AST: 27 U/L (ref 15–41)
Albumin: 3 g/dL — ABNORMAL LOW (ref 3.5–5.0)
Alkaline Phosphatase: 78 U/L (ref 38–126)
Anion gap: 5 (ref 5–15)
BUN: 23 mg/dL (ref 8–23)
CO2: 24 mmol/L (ref 22–32)
Calcium: 8.6 mg/dL — ABNORMAL LOW (ref 8.9–10.3)
Chloride: 102 mmol/L (ref 98–111)
Creatinine, Ser: 1.36 mg/dL — ABNORMAL HIGH (ref 0.61–1.24)
GFR, Estimated: 51 mL/min — ABNORMAL LOW (ref >60)
Glucose, Bld: 108 mg/dL — ABNORMAL HIGH (ref 70–99)
Potassium: 3.6 mmol/L (ref 3.5–5.1)
Sodium: 131 mmol/L — ABNORMAL LOW (ref 135–145)
Total Bilirubin: 1.3 mg/dL — ABNORMAL HIGH (ref 0.0–1.2)
Total Protein: 6.6 g/dL (ref 6.5–8.1)

## 2024-03-29 LAB — PHOSPHORUS: Phosphorus: 2.7 mg/dL (ref 2.5–4.6)

## 2024-03-29 LAB — CBC WITH DIFFERENTIAL/PLATELET
Abs Immature Granulocytes: 0.03 10*3/uL (ref 0.00–0.07)
Basophils Absolute: 0 10*3/uL (ref 0.0–0.1)
Basophils Relative: 1 %
Eosinophils Absolute: 0.4 10*3/uL (ref 0.0–0.5)
Eosinophils Relative: 7 %
HCT: 31.1 % — ABNORMAL LOW (ref 39.0–52.0)
Hemoglobin: 10.6 g/dL — ABNORMAL LOW (ref 13.0–17.0)
Immature Granulocytes: 1 %
Lymphocytes Relative: 37 %
Lymphs Abs: 2.5 10*3/uL (ref 0.7–4.0)
MCH: 30.5 pg (ref 26.0–34.0)
MCHC: 34.1 g/dL (ref 30.0–36.0)
MCV: 89.6 fL (ref 80.0–100.0)
Monocytes Absolute: 0.6 10*3/uL (ref 0.1–1.0)
Monocytes Relative: 9 %
Neutro Abs: 3 10*3/uL (ref 1.7–7.7)
Neutrophils Relative %: 45 %
Platelets: 184 10*3/uL (ref 150–400)
RBC: 3.47 MIL/uL — ABNORMAL LOW (ref 4.22–5.81)
RDW: 18.4 % — ABNORMAL HIGH (ref 11.5–15.5)
WBC: 6.6 10*3/uL (ref 4.0–10.5)
nRBC: 0 % (ref 0.0–0.2)

## 2024-03-29 LAB — MAGNESIUM: Magnesium: 2.2 mg/dL (ref 1.7–2.4)

## 2024-03-29 LAB — OSMOLALITY, URINE: Osmolality, Ur: 211 mosm/kg — ABNORMAL LOW (ref 300–900)

## 2024-03-29 LAB — OSMOLALITY: Osmolality: 282 mosm/kg (ref 275–295)

## 2024-03-29 NOTE — Consult Note (Signed)
 [facility] CLINIC CARDIOLOGY CONSULT NOTE       Patient ID: Mark Blevins MRN: [id number] DOB/AGE: [birth year] 83 y.o.  Admit date: 03/22/2024 Referring Physician Dr. Massie Soles Primary Physician Theron Flavin, MD Primary Cardiologist Garry Kansas, PA-C (saw in 2023) Reason for Consultation Severe Aortic Stenosis  HPI: Mark Blevins is a 83 y.o. 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 Patient with severe aortic stenosis and persistent orthostatic hypotension. Cardiology was consulted for further evaluation.   Patient presented to the ED on 05/13 due to frequent falls and worsening generalized weakness for past 2-3 weeks. Work up today notable for Na 131, K 3.6, Cr 1.36, Mg 2.2, Hgb 10.6, plts 184. Abdominal US  with no evidence of ascites. EKG in ED on 05/13 with atrial fibrillation, rate 76 bpm. Upon admission, trops minimally elevated at 30. BNP elevated at 355 however improved since prior admission (prior admission 01/2024 BNP 930). Throughout admission having issues with persistent orthostatic hypotension.   At the time of my evaluation this morning, patient was resting comfortably in hospital bed at a slight incline. Discussed patient symptoms in further details. Patient states he reported to ED due to falling twice. Patient states both times prior to fall he went from sit to stand too quickly and patient became lightheaded. Patient reports he is able to ambulate with his walker and perform ADLs without chest pain, SOB, lightheadedness or dizziness.  Patient states if he sits for "60 seconds" his dizziness and lightheadedness goes away. Patient reports having orthostatic hypotension for past 2-3 months and it's gotten worsen.   Patient saw Dr. Lydia Sams on 02/23/24 and discussed severe aortic stenosis. CT TAVR  protocol was ordered on 03/20 however patient unable to make it to [place] or [place] due to patient being in rehab.    Review of systems complete and found to be negative unless listed above    Past Medical History:  Diagnosis Date   Aortic valve stenosis    moderate (TTE 2022)   Atrial fibrillation (HCC)    BPH (benign prostatic hyperplasia)    Hypertension    Poor circulation of extremity    left leg   Venous reflux    Wears dentures    Full upper and lower    Past Surgical History:  Procedure Laterality Date   APPENDECTOMY     BACK SURGERY     CATARACT EXTRACTION W/PHACO Left 02/05/2022   Procedure: CATARACT EXTRACTION PHACO AND INTRAOCULAR LENS PLACEMENT (IOC) LEFT MALYUGIN 19.75 01:59.8;  Surgeon: Annell Kidney, MD;  Location: [facility];  Service: Ophthalmology;  Laterality: Left;   CATARACT EXTRACTION W/PHACO Right 02/19/2022   Procedure: CATARACT EXTRACTION PHACO AND INTRAOCULAR LENS PLACEMENT (IOC) RIGHT MALYUGIN 1900 01:55.6;  Surgeon: Annell Kidney, MD;  Location: [facility];  Service: Ophthalmology;  Laterality: Right;   COLECTOMY N/A 04/27/2019   Procedure: COLECTOMY WITH COLOSTOMY;  Surgeon: Conrado Delay, DO;  Location: [facility] ORS;  Service: General;  Laterality: N/A;   COLONOSCOPY WITH PROPOFOL  N/A 04/24/2019   Procedure: COLONOSCOPY WITH PROPOFOL ;  Surgeon: Conrado Delay, DO;  Location: [facility];  Service: General;  Laterality: N/A;   LAPAROSCOPIC LYSIS OF ADHESIONS  05/06/2019   Procedure: LAPAROSCOPIC LYSIS OF ADHESIONS;  Surgeon: Conrado Delay, DO;  Location: [facility] OR[facility];  Service: General;;   LAPAROSCOPY N/A 05/06/2019   Procedure: LAPAROSCOPY DIAGNOSTIC;  Surgeon: Conrado Delay, DO;  Location: [facility];  Service: General;  Laterality: N/A;   REPLACEMENT TOTAL KNEE BILATERAL     XI ROBOTIC ASSISTED COLOSTOMY TAKEDOWN N/A 02/03/2020   Procedure: XI ROBOTIC ASSISTED COLOSTOMY TAKEDOWN;  Surgeon: Conrado Delay, DO;  Location: [facility] ORS;   Service: General;  Laterality: N/A;    Medications Prior to Admission  Medication Sig Dispense Refill Last Dose/Taking   amLODipine  (NORVASC ) 5 MG tablet Take 5 mg by mouth daily.   03/21/2024   calcium  carbonate (OS-CAL - DOSED IN MG OF ELEMENTAL CALCIUM ) 1250 (500 Ca) MG tablet Take 1 tablet (1,250 mg total) by mouth 2 (two) times daily with a meal. 60 tablet 0 03/21/2024   potassium chloride  SA (KLOR-CON  M) 20 MEQ tablet Take 1 tablet (20 mEq total) by mouth daily. 30 tablet 0 03/21/2024   spironolactone  (ALDACTONE ) 25 MG tablet Take 1 tablet (25 mg total) by mouth daily. 30 tablet 0 03/21/2024   torsemide  (DEMADEX ) 5 MG tablet Take 5 mg by mouth daily.   03/21/2024   apixaban  (ELIQUIS ) 5 MG TABS tablet Take 1 tablet (5 mg total) by mouth 2 (two) times daily. (Patient not taking: Reported on 03/22/2024) 180 tablet 3 Not Taking   tamsulosin  (FLOMAX ) 0.4 MG CAPS capsule Take 1 capsule (0.4 mg total) by mouth daily after supper. (Patient not taking: Reported on 03/22/2024) 30 capsule 0 Not Taking   Social History   Socioeconomic History   Marital status: Married    Spouse name: Not on file   Number of children: Not on file   Years of education: Not on file   Highest education level: Not on file  Occupational History   Not on file  Tobacco Use   Smoking status: Former    Current packs/day: 0.00    Types: Cigarettes    Quit date: 1971    Years since quitting: 54.4   Smokeless tobacco: Current    Types: Chew   Tobacco comments:    quit smoking 1969 or 1970  Vaping Use   Vaping status: Never Used  Substance and Sexual Activity   Alcohol use: Yes    Alcohol/week: 24.0 standard drinks of alcohol    Types: 24 Cans of beer per week    Comment: 3-4 per day   Drug use: Never   Sexual activity: Yes  Other Topics Concern   Not on file  Social History Narrative   Not on file   Social Drivers of Health   Financial Resource Strain: Low Risk  (08/22/2021)   Overall Financial Resource Strain  (CARDIA)    Difficulty of Paying Living Expenses: Not hard at all  Food Insecurity: No Food Insecurity (03/22/2024)   Hunger Vital Sign    Worried About Running Out of Food in the Last Year: Never true    Ran Out of Food in the Last Year: Never true  Transportation Needs: No Transportation Needs (03/22/2024)   PRAPARE - Administrator, Civil Service (Medical): No    Lack of Transportation (Non-Medical): No  Physical Activity: Insufficiently Active (08/22/2021)   Exercise Vital Sign    Days of Exercise per Week: 2 days    Minutes of Exercise per Session: 20 min  Stress: No Stress Concern Present (08/22/2021)   [employer] of Occupational Health - Occupational Stress Questionnaire    Feeling of Stress : Not at all  Social Connections: Socially Integrated (03/22/2024)   Social Connection and Isolation Panel [NHANES]  Frequency of Communication with Friends and Family: More than three times a week    Frequency of Social Gatherings with Friends and Family: More than three times a week    Attends Religious Services: More than 4 times per year    Active Member of [employer] or Organizations: Yes    Attends Banker Meetings: More than 4 times per year    Marital Status: Married  Catering manager Violence: Not At Risk (03/22/2024)   Humiliation, Afraid, Rape, and Kick questionnaire    Fear of Current or Ex-Partner: No    Emotionally Abused: No    Physically Abused: No    Sexually Abused: No    History reviewed. No pertinent family history.   Vitals:   03/28/24 1546 03/28/24 2009 03/29/24 0452 03/29/24 0921  BP: 94/66 93/63 93/60  (!) 83/51  Pulse: 78 76 72 65  Resp: 16  17 17   Temp: 98.4 F (36.9 C) 98.4 F (36.9 C) 98.2 F (36.8 C) 98.7 F (37.1 C)  TempSrc:      SpO2: 100% 97% 97% 99%  Weight:      Height:        PHYSICAL EXAM General: well appearing elderly male, well nourished, in no acute distress. HEENT: Normocephalic and atraumatic. Neck: No  JVD.   Lungs: Normal respiratory effort on room air. Clear bilaterally to auscultation. No wheezes, crackles, rhonchi.  Heart: HRRR. Normal S1 and S2 without gallops, + diastolic murmur  Abdomen: Non-distended appearing.  Msk: Normal strength and tone for age. Extremities: Warm and well perfused. No clubbing, cyanosis. No edema.  Neuro: Alert and oriented X 3. Psych: Answers questions appropriately.   Labs: Basic Metabolic Panel: Recent Labs    03/28/24 0233 03/29/24 0514  NA 128* 131*  K 3.4* 3.6  CL 100 102  CO2 22 24  GLUCOSE 110* 108*  BUN 22 23  CREATININE 1.28* 1.36*  CALCIUM  8.2* 8.6*  MG 2.0 2.2  PHOS 2.5 2.7   Liver Function Tests: Recent Labs    03/28/24 0233 03/29/24 0514  AST 20 27  ALT 14 15  ALKPHOS 64 78  BILITOT 1.2 1.3*  PROT 6.0* 6.6  ALBUMIN 2.7* 3.0*   No results for input(s): "LIPASE", "AMYLASE" in the last 72 hours. CBC: Recent Labs    03/28/24 0233 03/29/24 0514  WBC 4.8 6.6  NEUTROABS 2.5 3.0  HGB 8.5* 10.6*  HCT 25.1* 31.1*  MCV 90.3 89.6  PLT 139* 184   Cardiac Enzymes: No results for input(s): "CKTOTAL", "CKMB", "CKMBINDEX", "TROPONINIHS" in the last 72 hours. BNP: No results for input(s): "BNP" in the last 72 hours. D-Dimer: No results for input(s): "DDIMER" in the last 72 hours. Hemoglobin A1C: No results for input(s): "HGBA1C" in the last 72 hours. Fasting Lipid Panel: No results for input(s): "CHOL", "HDL", "LDLCALC", "TRIG", "CHOLHDL", "LDLDIRECT" in the last 72 hours. Thyroid Function Tests: No results for input(s): "TSH", "T4TOTAL", "T3FREE", "THYROIDAB" in the last 72 hours.  Invalid input(s): "FREET3" Anemia Panel: No results for input(s): "VITAMINB12", "FOLATE", "FERRITIN", "TIBC", "IRON", "RETICCTPCT" in the last 72 hours.   Radiology: US  ASCITES (ABDOMEN LIMITED) Result Date: 03/28/2024 CLINICAL DATA:  [id number] Abdominal distension [phone] EXAM: LIMITED ABDOMEN ULTRASOUND FOR ASCITES TECHNIQUE: Limited ultrasound  survey for ascites was performed in all four abdominal quadrants. COMPARISON:  None Available. FINDINGS: No ascites identified. IMPRESSION: No ascites. Electronically Signed   By: Morgane  Naveau M.D.   On: 03/28/2024 21:41   DG Sacrum/Coccyx Result Date: 03/22/2024 CLINICAL  DATA:  83 year old male with frequent falls. Weakness. Shortness of breath. EXAM: SACRUM AND COCCYX - 2+ VIEW COMPARISON:  CT Abdomen and Pelvis 09/05/2021. FINDINGS: Three views at 0720 hours. On the lateral view the sacral and coccygeal segments appear stable from the 2022 sagittal CT and intact. Chronic lumbosacral junction disc space loss, endplate degeneration. SI joints appear symmetric. Elsewhere the visible pelvis appears intact. There is bowel gas in nondistended rectum, in other lower quadrant bowel loops which are similar to the 2022 CT. IMPRESSION: 1. No acute fracture or dislocation identified about the sacrum, coccyx. 2. Large bowel gas present to the distal rectum, and gas-filled bowel loops in the lower abdomen and pelvis similar to 2022 CT Abdomen and Pelvis. Electronically Signed   By: Marlise Simpers M.D.   On: 03/22/2024 07:43   DG Chest 2 View Result Date: 03/22/2024 CLINICAL DATA:  83 year old male with frequent falls. Weakness. Shortness of breath. EXAM: CHEST - 2 VIEW COMPARISON:  Chest radiographs 01/15/2024 and earlier. FINDINGS: Supine AP but also lateral views of the chest 0721 hours. Lower lung volumes compared to 2021. Extent rated cardiac size, mediastinal contours remain within normal limits. Visualized tracheal air column is within normal limits. No pneumothorax, pulmonary edema, pleural effusion or confluent lung opacity. Abundant bowel gas throughout the visible abdomen, appears to be mostly large bowel in nature. This is similar to although perhaps increased from CT Abdomen and Pelvis 09/05/2021. No acute osseous abnormality identified. IMPRESSION: 1. Lower lung volumes, no acute cardiopulmonary abnormality. 2.  Gas distended bowel in the visible abdomen, similar to although likely progressed from a CT Abdomen and Pelvis in 2022. Favor chronically decreased colonic inertia. Electronically Signed   By: Marlise Simpers M.D.   On: 03/22/2024 07:41    ECHO 01/14/2024 1. Left ventricular ejection fraction, by estimation, is 45 to 50%. The left ventricle has normal function. The left ventricle demonstrates regional wall motion abnormalities (see scoring diagram/findings for description). There is moderate concentric left ventricular hypertrophy. Left ventricular diastolic parameters are consistent with Grade I diastolic dysfunction (impaired relaxation).   2. Right ventricular systolic function is normal. The right ventricular size is normal.   3. The mitral valve is normal in structure. No evidence of mitral valve regurgitation.   4. The aortic valve is calcified. Aortic valve regurgitation is mild. Severe aortic valve stenosis.   Aortic Valve: The aortic valve is calcified. Aortic valve regurgitation is mild. Severe aortic stenosis is present. Aortic valve mean gradient measures 29.8 mmHg. Aortic valve peak gradient measures 53.7 mmHg. Aortic valve area, by VTI measures 0.58 cm.   TELEMETRY reviewed by me 03/29/2024: not on tele  EKG reviewed by me: atrial fibrillation, rate 76 bpm  Data reviewed by me 03/29/2024: last 24h vitals tele labs imaging I/O ED provider note, admission H&P, hospitalist progress note.  Principal Problem:   Falls Active Problems:   Fall   Orthostatic dizziness    ASSESSMENT AND PLAN:  Mark Blevins is a 83 y.o. 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 Patient with severe aortic stenosis and persistent orthostatic hypotension. Cardiology was consulted for further evaluation.   # Severe Aortic Stenosis #  Chronic HFmrEF (45-50%) # Persistent atrial fibrillation  # Orthostatic hypotension  # Frequent Falls Patient presents to ED with frequent falls and generalized weakness associated with orthostatic hypotension. Patient  having dizziness upon standing and BP severely orthostatic. Per hospitalist note 101/55 down to 65/56 with > 40 bpm in HR increase. Based on echo from 01/2024 patient has severe aortic stenosis and in process of evaluating for candidacy by outpatient cardiology with Dr. Lydia Sams. Patient stable and euvolemic on exam.  -Continue midodrine  5 mg TID and fludrocortisone  0.1 mg for orthostatic hypotension. -Dr. Marco Severs plans to see patient tomorrow to discuss severe aortic stenosis.   This patient's plan of care was discussed and created with Dr. Beau Bound and he is in agreement.  Signed: Creighton Doffing, PA-C  03/29/2024, 3:44 PM [facility] Cardiology

## 2024-03-29 NOTE — Plan of Care (Signed)

## 2024-03-29 NOTE — Progress Notes (Signed)
 Physical Therapy Treatment Patient Details Name: Mark Blevins MRN: [id number] DOB: 06/07/1941 Today's Date: 03/29/2024   History of Present Illness presented to ER secondary to recurrent falls, progressive weakness; admitted for management of AKI (related to dehydration?)    PT Comments  Pt is making gradual progress towards goals with ability to ambulate room distances with decreased dizziness this date. Pt remains orthostatic and is symptomatic with static stance. RW used. Orthostatic VS for the past 24 hrs (Last 3 readings):  BP- Lying Pulse- Lying BP- Sitting Pulse- Sitting BP- Standing at 0 minutes Pulse- Standing at 0 minutes  03/29/24 1413 122/67 69 (!) 82/65 77 (!) 66/36 93      If plan is discharge home, recommend the following: A little help with walking and/or transfers;A little help with bathing/dressing/bathroom   Can travel by private vehicle     Yes  Equipment Recommendations  None recommended by PT    Recommendations for Other Services       Precautions / Restrictions Precautions Precautions: Fall Recall of Precautions/Restrictions: Intact Restrictions Weight Bearing Restrictions Per Provider Order: No     Mobility  Bed Mobility Overal bed mobility: Needs Assistance Bed Mobility: Supine to Sit, Sit to Supine     Supine to sit: Supervision, Used rails     General bed mobility comments: takes increased time. Once seated, upright posture noted. Slight dizziness with change in position    Transfers Overall transfer level: Needs assistance Equipment used: Rolling walker (2 wheels) Transfers: Sit to/from Stand, Bed to chair/wheelchair/BSC Sit to Stand: Contact guard assist           General transfer comment: needs cues for sequencing    Ambulation/Gait Ambulation/Gait assistance: Contact guard assist Gait Distance (Feet): 30 Feet Assistive device: Rolling walker (2 wheels) Gait Pattern/deviations: Decreased stride length, Trunk flexed        General Gait Details: ambulates with RW. Reciprocal gait pattern. No reports of dizziness despite low BP   Stairs             Wheelchair Mobility     Tilt Bed    Modified Rankin (Stroke Patients Only)       Balance Overall balance assessment: Needs assistance Sitting-balance support: No upper extremity supported, Feet supported Sitting balance-Leahy Scale: Good     Standing balance support: Bilateral upper extremity supported Standing balance-Leahy Scale: Fair                              Hotel manager: No apparent difficulties  Cognition Arousal: Alert Behavior During Therapy: WFL for tasks assessed/performed   PT - Cognitive impairments: Safety/Judgement                       PT - Cognition Comments: falls risk Following commands: Intact      Cueing Cueing Techniques: Verbal cues  Exercises Other Exercises Other Exercises: ambulated to bathroom with cga. Able to perform self hygiene    General Comments        Pertinent Vitals/Pain Pain Assessment Pain Assessment: Faces Faces Pain Scale: Hurts a little bit Pain Location: sacrum due to recent fall Pain Descriptors / Indicators: Aching, Discomfort Pain Intervention(s): Limited activity within patient's tolerance, Repositioned    Home Living                          Prior Function  PT Goals (current goals can now be found in the care plan section) Acute Rehab PT Goals Patient Stated Goal: to have better BP and go home PT Goal Formulation: With patient Time For Goal Achievement: 04/05/24 Potential to Achieve Goals: Good Progress towards PT goals: Progressing toward goals    Frequency    Min 2X/week      PT Plan      Co-evaluation              AM-PAC PT "6 Clicks" Mobility   Outcome Measure  Help needed turning from your back to your side while in a flat bed without using bedrails?: None Help needed  moving from lying on your back to sitting on the side of a flat bed without using bedrails?: A Little Help needed moving to and from a bed to a chair (including a wheelchair)?: A Little Help needed standing up from a chair using your arms (e.g., wheelchair or bedside chair)?: A Little Help needed to walk in hospital room?: A Little Help needed climbing 3-5 steps with a railing? : A Lot 6 Click Score: 18    End of Session Equipment Utilized During Treatment: Gait belt Activity Tolerance: Patient tolerated treatment well Patient left: in chair;with call bell/phone within reach;with chair alarm set Nurse Communication: Mobility status;Precautions PT Visit Diagnosis: Muscle weakness (generalized) (M62.81);Difficulty in walking, not elsewhere classified (R26.2);Pain     Time: 1610-9604 PT Time Calculation (min) (ACUTE ONLY): 36 min  Charges:    $Therapeutic Activity: 23-37 mins PT General Charges $$ ACUTE PT VISIT: 1 Visit                     Amparo Balk, PT, DPT, GCS [phone]    Reeder Brisby 03/29/2024, 4:20 PM

## 2024-03-29 NOTE — TOC Progression Note (Signed)
 Transition of Care [facility]) - Progression Note    Patient Details  Name: Mark Blevins MRN: [id number] Date of Birth: 05-17-1941  [facility] CM/SW Contact  Alexandra Ice, RN Phone Number: 03/29/2024, 8:57 AM  Clinical Narrative:     Met with patient to discuss SNF choice. He states he does not want to go to SNF, would like to go home with home health. Notified MD and bedside nurse.        Expected Discharge Plan and Services                                               Social Determinants of Health (SDOH) Interventions SDOH Screenings   Food Insecurity: No Food Insecurity (03/22/2024)  Housing: Low Risk  (03/22/2024)  Transportation Needs: No Transportation Needs (03/22/2024)  Utilities: Not At Risk (03/22/2024)  Alcohol Screen: Low Risk  (08/22/2021)  Depression (PHQ2-9): Low Risk  (01/13/2022)  Financial Resource Strain: Low Risk  (08/22/2021)  Physical Activity: Insufficiently Active (08/22/2021)  Social Connections: Socially Integrated (03/22/2024)  Stress: No Stress Concern Present (08/22/2021)  Tobacco Use: High Risk (03/22/2024)    Readmission Risk Interventions     No data to display

## 2024-03-29 NOTE — Progress Notes (Addendum)
 PROGRESS NOTE    Mark Blevins  NGE:[id number] DOB: Aug 25, 1941 DOA: 03/22/2024 PCP: Theron Flavin, MD  Chief Complaint  Patient presents with   [facility] Course:  Mark Blevins is an 83 year old male with CHF, severe aortic stenosis, paroxysmal A-fib, CKD, who presented after a fall.  Patient reports he has been getting progressively weaker for 2 to 3 weeks and then began having frequent falls.  He reports he is fallen on his butt and is having pain in that area.  Denies any LOC.  He also endorses worsening shortness of breath for the last few weeks that is constant and nonpositional.  Patient was discharged from [facility] on 3/14 to SNF for rehab after being treated for CHF exacerbation.  Patient said he did well in rehab and was walking stairs without assistance when he left SNF. On arrival to the ED vital signs were WNL.  Labs notable for creatinine of 1.95, BUN 41.  CXR without acute pulmonary abnormality.  Pelvic x-ray without acute fracture or dislocation and sacrum or coccyx.  Patient received 500 cc bolus and was admitted for observation  Subjective: Upon further discussion this morning patient reports he is not interested in going to SNF for rehab.  He believes his problem is going to remain the same and thus he would rather be at home. When attempting to work with physical therapy today his blood pressure went from 122/67 while lying to 66/36 when standing.  Objective: Vitals:   03/28/24 2009 03/29/24 0452 03/29/24 0921 03/29/24 1612  BP: 93/63 93/60 (!) 83/51 107/62  Pulse: 76 72 65 65  Resp:  17 17 17   Temp: 98.4 F (36.9 C) 98.2 F (36.8 C) 98.7 F (37.1 C) 97.9 F (36.6 C)  TempSrc:      SpO2: 97% 97% 99% 100%  Weight:      Height:        Intake/Output Summary (Last 24 hours) at 03/29/2024 1707 Last data filed at 03/29/2024 1300 Gross per 24 hour  Intake 480 ml  Output --  Net 480 ml    Filed Weights   03/22/24 1700  Weight: 79.3 kg     Examination: General exam: Appears calm and comfortable, NAD  Respiratory system: No work of breathing, symmetric chest wall expansion Cardiovascular system: S1 & S2 heard, RRR.  Gastrointestinal system: Abdomen soft, very distended, bowel sounds present x 4 Neuro: Alert and oriented. No focal neurological deficits. Extremities: Symmetric, expected ROM Skin: No rashes, lesions Psychiatry: Demonstrates appropriate judgement and insight. Mood & affect appropriate for situation.   Assessment & Plan:  Principal Problem:   Falls Active Problems:   Fall   Orthostatic dizziness   Orthostatic hypotension    Weakness and falls Recurrent Syncope - Largely related to orthostatic hypotension and severe aortic stenosis - Patient believes that his dizziness is improving but he continues to have profound orthostatic drop with standing. -Continue working with PT/OT.  Patient would like to discharge home with home health instead of SNF when this is appropriate. - At baseline he resides with his 64 year old wife who is not present in the home 24/7.  His son has significant concerns about discharging home  Orthostatic hypotension - Orthostasis remains profound with > 60 mmHg SBP drop with positional changes.  Today: Lying BP 122/67.    Standing: 66/36.  - Continue with fludrocortisone , midodrine , TED hose - Have reengaged cardiology.  Patient's aortic stenosis is certainly playing a role in his syncope. -  Continue orthostatic precautions - Continue PT/OT for gait stability  Severe aortic stenosis - Patient was following with Dr. Philippa Bray outpatient for candidacy of TAVR.  Has not yet performed CT scan.  Given the severity of his syncope and orthostasis, this will require inpatient evaluation - Have reengaged cardiology.  They report Dr. Philippa Bray will see the patient tomorrow.  Appreciate any recommendations.  Patient is unlikely to complete outpatient workup given profound orthostasis. He is  unsafe to discharge until this is addressed  CKD 3A - Baseline creatinine appears to be between 1.4 and 1.8 - Creatinine 1.95 on presentation.  Status post small 500 cc bolus - Elevated creatinine likely secondary to chronic diuretic use and intermittent hypotension - Have discontinued all diuretics and antihypertensives - Creatinine has now improved beyond baseline - Continue to follow - Renally dose with a creatinine clearance of 44 when needed - Avoid hypotension - Avoid nephrotoxic medications  Chronic combined systolic and diastolic CHF - Currently stable, clinically euvolemic - Echocardiogram: EF 45 to 50%, grade 1 diastolic dysfunction, severe aortic stenosis - Outpatient cardiac visit 4/15: Coreg  discontinued, Aldactone  and furosemide  reduced. - Have discontinued all antihypertensives as above continue to monitor volume status closely.  No pedal edema, pulmonary edema appreciated - Patient's abdomen does appear distended.  He may be experiencing hepatic congestion.  No ascites seen on abdominal ultrasound  Hyponatremia - Chronic.  Has received NS this admission -Continue trending CMP - Have liberalized diet orders to allow salt  Paroxysmal A-fib - Currently rate controlled without beta-blocker - Patient was meant to be on Eliquis  though med reconciliation reveals patient was not taking.  Will not restart Eliquis  at this time given frequent falls.  High bleeding risk   BPH - Continue low-dose Flomax   Normocytic anemia - Hemoglobin downtrending.  No acute blood loss noted. - Downtrend may be secondary to volume overload. - Baseline Hemoglobin around 11 downtrended to 8.5  5/19.  Has resolved to 10.6 without intervention now. - No melena, no BRBPR - Last colonoscopy 2020 - Iron studies: Low TIBC, low iron, TSAT 20.  Suspect early iron deficiency anemia.  Started ferrous sulfate  daily with Colace  Thrombocytopenia - Resolved.  Distended abdomen, hepatic congestion? -  Patient reports that this is chronic.  He does not believe it is worse than normal.  It is soft but appears larger than prior - Possibility that patient is retaining fluid in his abdomen as result of his heart failure.  No other evidence of volume overload in extremities or lungs.  Labs 5/19 consistent with volume overload (hyponatremia, downtrending hemoglobin) - Abdominal ultrasound without ascites  DVT prophylaxis: lovenox    Code Status: Full Code Disposition:   Severe AS work up ongoing. Eventually DC to home with [facility]  Consultants:    Procedures:    Antimicrobials:  Anti-infectives (From admission, onward)    None       Data Reviewed: I have personally reviewed following labs and imaging studies CBC: Recent Labs  Lab 03/25/24 0544 03/26/24 0438 03/27/24 0445 03/28/24 0233 03/29/24 0514  WBC 4.8 4.0 4.4 4.8 6.6  NEUTROABS 2.7 2.1 2.4 2.5 3.0  HGB 9.9* 9.4* 9.2* 8.5* 10.6*  HCT 29.2* 27.7* 26.1* 25.1* 31.1*  MCV 89.3 89.1 88.5 90.3 89.6  PLT 150 142* 132* 139* 184   Basic Metabolic Panel: Recent Labs  Lab 03/25/24 0544 03/26/24 0438 03/27/24 0445 03/28/24 0233 03/29/24 0514  NA 130* 130* 130* 128* 131*  K 3.7 3.6 3.6 3.4* 3.6  CL 100 103 101 100 102  CO2 22 21* 22 22 24   GLUCOSE 93 115* 122* 110* 108*  BUN 25* 26* 23 22 23   CREATININE 1.30* 1.37* 1.31* 1.28* 1.36*  CALCIUM  8.5* 8.4* 8.4* 8.2* 8.6*  MG 2.0 2.1 1.9 2.0 2.2  PHOS 3.1 3.0 3.0 2.5 2.7   GFR: Estimated Creatinine Clearance: 44.4 mL/min (A) (by C-G formula based on SCr of 1.36 mg/dL (H)). Liver Function Tests: Recent Labs  Lab 03/25/24 0544 03/26/24 0438 03/27/24 0445 03/28/24 0233 03/29/24 0514  AST 19 18 18 20 27   ALT 14 13 15 14 15   ALKPHOS 62 62 67 64 78  BILITOT 1.1 1.0 1.2 1.2 1.3*  PROT 6.2* 6.1* 6.1* 6.0* 6.6  ALBUMIN 2.8* 2.8* 2.8* 2.7* 3.0*   CBG: No results for input(s): "GLUCAP" in the last 168 hours.  No results found for this or any previous visit (from the past 240  hours).   Radiology Studies: US  ASCITES (ABDOMEN LIMITED) Result Date: 03/28/2024 CLINICAL DATA:  54098 Abdominal distension [phone] EXAM: LIMITED ABDOMEN ULTRASOUND FOR ASCITES TECHNIQUE: Limited ultrasound survey for ascites was performed in all four abdominal quadrants. COMPARISON:  None Available. FINDINGS: No ascites identified. IMPRESSION: No ascites. Electronically Signed   By: Morgane  Naveau M.D.   On: 03/28/2024 21:41     Scheduled Meds:  docusate sodium   100 mg Oral Daily   enoxaparin  (LOVENOX ) injection  40 mg Subcutaneous Q24H   ferrous sulfate   325 mg Oral Q breakfast   fludrocortisone   0.1 mg Oral Daily   lidocaine   1 patch Transdermal Q24H   midodrine   5 mg Oral TID WC   tamsulosin   0.4 mg Oral QPC supper   Continuous Infusions:   LOS: 0 days  MDM: Patient is high risk for one or more organ failure.  They necessitate ongoing hospitalization for continued IV therapies and subsequent lab monitoring. Total time spent interpreting labs and vitals, reviewing the medical record, coordinating care amongst consultants and care team members, directly assessing and discussing care with the patient and/or family: 55 min  Madailein Londo, DO [facility]  To contact the attending physician between 7A-7P please use Epic Chat. To contact the covering physician during after hours 7P-7A, please review Amion.  03/29/2024, 5:07 PM   *This document has been created with the assistance of dictation software. Please excuse typographical errors. *

## 2024-03-30 DIAGNOSIS — I951 Orthostatic hypotension: Secondary | ICD-10-CM | POA: Diagnosis not present

## 2024-03-30 LAB — CBC WITH DIFFERENTIAL/PLATELET
Abs Immature Granulocytes: 0.05 10*3/uL (ref 0.00–0.07)
Basophils Absolute: 0 10*3/uL (ref 0.0–0.1)
Basophils Relative: 1 %
Eosinophils Absolute: 0.4 10*3/uL (ref 0.0–0.5)
Eosinophils Relative: 8 %
HCT: 27.1 % — ABNORMAL LOW (ref 39.0–52.0)
Hemoglobin: 9.3 g/dL — ABNORMAL LOW (ref 13.0–17.0)
Immature Granulocytes: 1 %
Lymphocytes Relative: 30 %
Lymphs Abs: 1.4 10*3/uL (ref 0.7–4.0)
MCH: 30.8 pg (ref 26.0–34.0)
MCHC: 34.3 g/dL (ref 30.0–36.0)
MCV: 89.7 fL (ref 80.0–100.0)
Monocytes Absolute: 0.5 10*3/uL (ref 0.1–1.0)
Monocytes Relative: 11 %
Neutro Abs: 2.3 10*3/uL (ref 1.7–7.7)
Neutrophils Relative %: 49 %
Platelets: 165 10*3/uL (ref 150–400)
RBC: 3.02 MIL/uL — ABNORMAL LOW (ref 4.22–5.81)
RDW: 18.5 % — ABNORMAL HIGH (ref 11.5–15.5)
WBC: 4.6 10*3/uL (ref 4.0–10.5)
nRBC: 0 % (ref 0.0–0.2)

## 2024-03-30 LAB — PHOSPHORUS: Phosphorus: 2.8 mg/dL (ref 2.5–4.6)

## 2024-03-30 LAB — COMPREHENSIVE METABOLIC PANEL WITH GFR
ALT: 13 U/L (ref 0–44)
AST: 20 U/L (ref 15–41)
Albumin: 2.8 g/dL — ABNORMAL LOW (ref 3.5–5.0)
Alkaline Phosphatase: 67 U/L (ref 38–126)
Anion gap: 8 (ref 5–15)
BUN: 23 mg/dL (ref 8–23)
CO2: 20 mmol/L — ABNORMAL LOW (ref 22–32)
Calcium: 8.3 mg/dL — ABNORMAL LOW (ref 8.9–10.3)
Chloride: 104 mmol/L (ref 98–111)
Creatinine, Ser: 1.33 mg/dL — ABNORMAL HIGH (ref 0.61–1.24)
GFR, Estimated: 53 mL/min — ABNORMAL LOW (ref >60)
Glucose, Bld: 105 mg/dL — ABNORMAL HIGH (ref 70–99)
Potassium: 3.2 mmol/L — ABNORMAL LOW (ref 3.5–5.1)
Sodium: 132 mmol/L — ABNORMAL LOW (ref 135–145)
Total Bilirubin: 1.3 mg/dL — ABNORMAL HIGH (ref 0.0–1.2)
Total Protein: 6 g/dL — ABNORMAL LOW (ref 6.5–8.1)

## 2024-03-30 LAB — MAGNESIUM: Magnesium: 2 mg/dL (ref 1.7–2.4)

## 2024-03-30 MED ORDER — DOCUSATE SODIUM 100 MG PO CAPS: 100.0000 mg | ORAL_CAPSULE | Freq: Every day | ORAL | 0 refills | Status: AC

## 2024-03-30 MED ORDER — POTASSIUM CHLORIDE CRYS ER 20 MEQ PO TBCR
40.0000 meq | EXTENDED_RELEASE_TABLET | Freq: Once | ORAL | Status: AC
  Administered 2024-03-30: 40 meq via ORAL
  Filled 2024-03-30: qty 2

## 2024-03-30 MED ORDER — FINASTERIDE 5 MG PO TABS: 5.0000 mg | ORAL_TABLET | Freq: Every day | ORAL | 0 refills | Status: AC

## 2024-03-30 MED ORDER — MIDODRINE HCL 5 MG PO TABS
5.0000 mg | ORAL_TABLET | Freq: Three times a day (TID) | ORAL | 0 refills | Status: AC
Start: 2024-03-30 — End: ?

## 2024-03-30 MED ORDER — FERROUS SULFATE 325 (65 FE) MG PO TABS: 325.0000 mg | ORAL_TABLET | Freq: Every day | ORAL | 0 refills | Status: AC

## 2024-03-30 MED ORDER — FLUDROCORTISONE ACETATE 0.1 MG PO TABS: 0.1000 mg | ORAL_TABLET | Freq: Every day | ORAL | 0 refills | Status: AC

## 2024-03-30 NOTE — TOC Progression Note (Signed)
 Transition of Care [facility]) - Progression Note    Patient Details  Name: Mark Blevins MRN: [id number] Date of Birth: 07-21-1941  Transition of [facility]) CM/SW Contact  Alexandra Ice, RN Phone Number: 03/30/2024, 4:02 PM  Clinical Narrative:     Patient has DME orders for [facility] and BSC, orders sent to Jon with [employer] for processing. Patient to discharge today, home with home health services, was set up with [facility]. Notified Shaun of discharge today.       Expected Discharge Plan and Services         Expected Discharge Date: 03/30/24                                     Social Determinants of Health (SDOH) Interventions SDOH Screenings   Food Insecurity: No Food Insecurity (03/22/2024)  Housing: Low Risk  (03/22/2024)  Transportation Needs: No Transportation Needs (03/22/2024)  Utilities: Not At Risk (03/22/2024)  Alcohol Screen: Low Risk  (08/22/2021)  Depression (PHQ2-9): Low Risk  (01/13/2022)  Financial Resource Strain: Low Risk  (08/22/2021)  Physical Activity: Insufficiently Active (08/22/2021)  Social Connections: Socially Integrated (03/22/2024)  Stress: No Stress Concern Present (08/22/2021)  Tobacco Use: High Risk (03/22/2024)    Readmission Risk Interventions     No data to display

## 2024-03-30 NOTE — Progress Notes (Signed)
 Patient is not able to walk the distance required to go the bathroom, or he/she is unable to safely negotiate stairs required to access the bathroom.  A 3in1 BSC will alleviate this problem

## 2024-03-30 NOTE — Plan of Care (Signed)

## 2024-03-30 NOTE — Progress Notes (Signed)
 [facility] CLINIC CARDIOLOGY PROGRESS NOTE       Patient ID: Mark Blevins MRN: [id number] DOB/AGE: [birth year] 83 y.o.  Admit date: 03/22/2024 Referring Physician Dr. Massie Soles Primary Physician Theron Flavin, MD Primary Cardiologist Garry Kansas, PA-C (saw in 2023) Reason for Consultation Severe Aortic Stenosis  HPI: Mark Blevins is a 83 y.o. 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 Patient with severe aortic stenosis and persistent orthostatic hypotension. Cardiology was consulted for further evaluation.   Interval History: -Patient seen and examined this AM and laying comfortably in hospital bed. Patient states he feels well this morning and denies any chest pain, lightheadedness, dizziness or SOB. -Patient states he stood up for about 10 minutes with his walker and remained asymptomatic. Patient reports he "only felt his legs were weak".   -Patients BP borderline and HR stable this AM. Overnight Tele showed no significant events.  -Patient remains on room air with stable SpO2.   -Patient saw Dr. Lydia Sams on 02/23/24 and discussed severe aortic stenosis. CT TAVR protocol was ordered on 03/20 however patient unable to make it to [place] or [place] due to patient being in rehab.  -Dr. Marco Severs saw patient this afternoon to discuss aortic stenosis. Coordinated outpatient CT TAVR to evaluate for TAVR feasibility.    Review of systems complete and found to be negative unless listed above    Past Medical History:  Diagnosis Date   Aortic valve stenosis    moderate (TTE 2022)   Atrial fibrillation (HCC)    BPH (benign prostatic hyperplasia)    Hypertension    Poor circulation of extremity    left leg   Venous reflux    Wears dentures    Full upper and lower    Past Surgical History:   Procedure Laterality Date   APPENDECTOMY     BACK SURGERY     CATARACT EXTRACTION W/PHACO Left 02/05/2022   Procedure: CATARACT EXTRACTION PHACO AND INTRAOCULAR LENS PLACEMENT (IOC) LEFT MALYUGIN 19.75 01:59.8;  Surgeon: Annell Kidney, MD;  Location: [facility];  Service: Ophthalmology;  Laterality: Left;   CATARACT EXTRACTION W/PHACO Right 02/19/2022   Procedure: CATARACT EXTRACTION PHACO AND INTRAOCULAR LENS PLACEMENT (IOC) RIGHT MALYUGIN 1900 01:55.6;  Surgeon: Annell Kidney, MD;  Location: [facility];  Service: Ophthalmology;  Laterality: Right;   COLECTOMY N/A 04/27/2019   Procedure: COLECTOMY WITH COLOSTOMY;  Surgeon: Conrado Delay, DO;  Location: [facility];  Service: General;  Laterality: N/A;   COLONOSCOPY WITH PROPOFOL  N/A 04/24/2019   Procedure: COLONOSCOPY WITH PROPOFOL ;  Surgeon: Conrado Delay, DO;  Location: [facility];  Service: General;  Laterality: N/A;   LAPAROSCOPIC LYSIS OF ADHESIONS  05/06/2019   Procedure: LAPAROSCOPIC LYSIS OF ADHESIONS;  Surgeon: Conrado Delay, DO;  Location: [facility];  Service: General;;   LAPAROSCOPY N/A 05/06/2019   Procedure: LAPAROSCOPY DIAGNOSTIC;  Surgeon: Conrado Delay, DO;  Location: [facility];  Service: General;  Laterality: N/A;   REPLACEMENT TOTAL KNEE BILATERAL     XI ROBOTIC ASSISTED COLOSTOMY TAKEDOWN N/A 02/03/2020   Procedure: XI ROBOTIC ASSISTED COLOSTOMY TAKEDOWN;  Surgeon: Conrado Delay, DO;  Location: [facility];  Service: General;  Laterality: N/A;    Medications Prior to Admission  Medication Sig Dispense Refill Last Dose/Taking   amLODipine  (NORVASC ) 5 MG tablet Take 5 mg by mouth daily.   03/21/2024   calcium   carbonate (OS-CAL - DOSED IN MG OF ELEMENTAL CALCIUM ) 1250 (500 Ca) MG tablet Take 1 tablet (1,250 mg total) by mouth 2 (two) times daily with a meal. 60 tablet 0 03/21/2024   potassium chloride  SA (KLOR-CON  M) 20 MEQ tablet Take 1 tablet (20 mEq total) by mouth daily. 30 tablet 0 03/21/2024    spironolactone  (ALDACTONE ) 25 MG tablet Take 1 tablet (25 mg total) by mouth daily. 30 tablet 0 03/21/2024   torsemide  (DEMADEX ) 5 MG tablet Take 5 mg by mouth daily.   03/21/2024   apixaban  (ELIQUIS ) 5 MG TABS tablet Take 1 tablet (5 mg total) by mouth 2 (two) times daily. (Patient not taking: Reported on 03/22/2024) 180 tablet 3 Not Taking   tamsulosin  (FLOMAX ) 0.4 MG CAPS capsule Take 1 capsule (0.4 mg total) by mouth daily after supper. (Patient not taking: Reported on 03/22/2024) 30 capsule 0 Not Taking   Social History   Socioeconomic History   Marital status: Married    Spouse name: Not on file   Number of children: Not on file   Years of education: Not on file   Highest education level: Not on file  Occupational History   Not on file  Tobacco Use   Smoking status: Former    Current packs/day: 0.00    Types: Cigarettes    Quit date: 1971    Years since quitting: 54.4   Smokeless tobacco: Current    Types: Chew   Tobacco comments:    quit smoking 1969 or 1970  Vaping Use   Vaping status: Never Used  Substance and Sexual Activity   Alcohol use: Yes    Alcohol/week: 24.0 standard drinks of alcohol    Types: 24 Cans of beer per week    Comment: 3-4 per day   Drug use: Never   Sexual activity: Yes  Other Topics Concern   Not on file  Social History Narrative   Not on file   Social Drivers of Health   Financial Resource Strain: Low Risk  (08/22/2021)   Overall Financial Resource Strain (CARDIA)    Difficulty of Paying Living Expenses: Not hard at all  Food Insecurity: No Food Insecurity (03/22/2024)   Hunger Vital Sign    Worried About Running Out of Food in the Last Year: Never true    Ran Out of Food in the Last Year: Never true  Transportation Needs: No Transportation Needs (03/22/2024)   PRAPARE - Administrator, Civil Service (Medical): No    Lack of Transportation (Non-Medical): No  Physical Activity: Insufficiently Active (08/22/2021)   Exercise Vital  Sign    Days of Exercise per Week: 2 days    Minutes of Exercise per Session: 20 min  Stress: No Stress Concern Present (08/22/2021)   [employer] of Occupational Health - Occupational Stress Questionnaire    Feeling of Stress : Not at all  Social Connections: Socially Integrated (03/22/2024)   Social Connection and Isolation Panel [NHANES]    Frequency of Communication with Friends and Family: More than three times a week    Frequency of Social Gatherings with Friends and Family: More than three times a week    Attends Religious Services: More than 4 times per year    Active Member of [employer] or Organizations: Yes    Attends Banker Meetings: More than 4 times per year    Marital Status: Married  Catering manager Violence: Not At Risk (03/22/2024)   Humiliation, Afraid, Rape, and Kick  questionnaire    Fear of Current or Ex-Partner: No    Emotionally Abused: No    Physically Abused: No    Sexually Abused: No    History reviewed. No pertinent family history.   Vitals:   03/30/24 0328 03/30/24 0820 03/30/24 1512 03/30/24 1513  BP: 106/69 101/73  115/61  Pulse: 74 84 64 62  Resp: 20 19 19    Temp: 97.6 F (36.4 C) (!) 97.5 F (36.4 C) 97.9 F (36.6 C)   TempSrc:  Oral    SpO2: 96% 99% 100% 100%  Weight:      Height:        PHYSICAL EXAM General: well appearing elderly male, well nourished, in no acute distress. HEENT: Normocephalic and atraumatic. Neck: No JVD.   Lungs: Normal respiratory effort on room air. Clear bilaterally to auscultation. No wheezes, crackles, rhonchi.  Heart: HRRR. Normal S1 and S2 without gallops, + diastolic murmur  Abdomen: Non-distended appearing.  Msk: Normal strength and tone for age. Extremities: Warm and well perfused. No clubbing, cyanosis. No edema.  Neuro: Alert and oriented X 3. Psych: Answers questions appropriately.   Labs: Basic Metabolic Panel: Recent Labs    03/29/24 0514 03/30/24 0401  NA 131* 132*  K 3.6 3.2*   CL 102 104  CO2 24 20*  GLUCOSE 108* 105*  BUN 23 23  CREATININE 1.36* 1.33*  CALCIUM  8.6* 8.3*  MG 2.2 2.0  PHOS 2.7 2.8   Liver Function Tests: Recent Labs    03/29/24 0514 03/30/24 0401  AST 27 20  ALT 15 13  ALKPHOS 78 67  BILITOT 1.3* 1.3*  PROT 6.6 6.0*  ALBUMIN 3.0* 2.8*   No results for input(s): "LIPASE", "AMYLASE" in the last 72 hours. CBC: Recent Labs    03/29/24 0514 03/30/24 0401  WBC 6.6 4.6  NEUTROABS 3.0 2.3  HGB 10.6* 9.3*  HCT 31.1* 27.1*  MCV 89.6 89.7  PLT 184 165   Cardiac Enzymes: No results for input(s): "CKTOTAL", "CKMB", "CKMBINDEX", "TROPONINIHS" in the last 72 hours. BNP: No results for input(s): "BNP" in the last 72 hours. D-Dimer: No results for input(s): "DDIMER" in the last 72 hours. Hemoglobin A1C: No results for input(s): "HGBA1C" in the last 72 hours. Fasting Lipid Panel: No results for input(s): "CHOL", "HDL", "LDLCALC", "TRIG", "CHOLHDL", "LDLDIRECT" in the last 72 hours. Thyroid Function Tests: No results for input(s): "TSH", "T4TOTAL", "T3FREE", "THYROIDAB" in the last 72 hours.  Invalid input(s): "FREET3" Anemia Panel: No results for input(s): "VITAMINB12", "FOLATE", "FERRITIN", "TIBC", "IRON", "RETICCTPCT" in the last 72 hours.   Radiology: US  ASCITES (ABDOMEN LIMITED) Result Date: 03/28/2024 CLINICAL DATA:  [address] Abdominal distension [phone] EXAM: LIMITED ABDOMEN ULTRASOUND FOR ASCITES TECHNIQUE: Limited ultrasound survey for ascites was performed in all four abdominal quadrants. COMPARISON:  None Available. FINDINGS: No ascites identified. IMPRESSION: No ascites. Electronically Signed   By: Morgane  Naveau M.D.   On: 03/28/2024 21:41   DG Sacrum/Coccyx Result Date: 03/22/2024 CLINICAL DATA:  83 year old male with frequent falls. Weakness. Shortness of breath. EXAM: SACRUM AND COCCYX - 2+ VIEW COMPARISON:  CT Abdomen and Pelvis 09/05/2021. FINDINGS: Three views at 0720 hours. On the lateral view the sacral and coccygeal  segments appear stable from the 2022 sagittal CT and intact. Chronic lumbosacral junction disc space loss, endplate degeneration. SI joints appear symmetric. Elsewhere the visible pelvis appears intact. There is bowel gas in nondistended rectum, in other lower quadrant bowel loops which are similar to the 2022 CT. IMPRESSION: 1. No  acute fracture or dislocation identified about the sacrum, coccyx. 2. Large bowel gas present to the distal rectum, and gas-filled bowel loops in the lower abdomen and pelvis similar to 2022 CT Abdomen and Pelvis. Electronically Signed   By: Marlise Simpers M.D.   On: 03/22/2024 07:43   DG Chest 2 View Result Date: 03/22/2024 CLINICAL DATA:  83 year old male with frequent falls. Weakness. Shortness of breath. EXAM: CHEST - 2 VIEW COMPARISON:  Chest radiographs 01/15/2024 and earlier. FINDINGS: Supine AP but also lateral views of the chest 0721 hours. Lower lung volumes compared to 2021. Extent rated cardiac size, mediastinal contours remain within normal limits. Visualized tracheal air column is within normal limits. No pneumothorax, pulmonary edema, pleural effusion or confluent lung opacity. Abundant bowel gas throughout the visible abdomen, appears to be mostly large bowel in nature. This is similar to although perhaps increased from CT Abdomen and Pelvis 09/05/2021. No acute osseous abnormality identified. IMPRESSION: 1. Lower lung volumes, no acute cardiopulmonary abnormality. 2. Gas distended bowel in the visible abdomen, similar to although likely progressed from a CT Abdomen and Pelvis in 2022. Favor chronically decreased colonic inertia. Electronically Signed   By: Marlise Simpers M.D.   On: 03/22/2024 07:41    ECHO 01/14/2024 1. Left ventricular ejection fraction, by estimation, is 45 to 50%. The left ventricle has normal function. The left ventricle demonstrates regional wall motion abnormalities (see scoring diagram/findings for description). There is moderate concentric left  ventricular hypertrophy. Left ventricular diastolic parameters are consistent with Grade I diastolic dysfunction (impaired relaxation).   2. Right ventricular systolic function is normal. The right ventricular size is normal.   3. The mitral valve is normal in structure. No evidence of mitral valve regurgitation.   4. The aortic valve is calcified. Aortic valve regurgitation is mild. Severe aortic valve stenosis.   Aortic Valve: The aortic valve is calcified. Aortic valve regurgitation is mild. Severe aortic stenosis is present. Aortic valve mean gradient measures 29.8 mmHg. Aortic valve peak gradient measures 53.7 mmHg. Aortic valve area, by VTI measures 0.58 cm.   TELEMETRY reviewed by me 03/30/2024: not on tele  EKG reviewed by me: atrial fibrillation, rate 76 bpm  Data reviewed by me 03/30/2024: last 24h vitals tele labs imaging I/O hospitalist progress note.  Principal Problem:   Orthostatic hypotension Active Problems:   Paroxysmal atrial fibrillation (HCC)   Aortic stenosis   BPH (benign prostatic hyperplasia)   Falls    ASSESSMENT AND PLAN:  Mark Blevins is a 83 y.o. 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 Patient with severe aortic stenosis and persistent orthostatic hypotension. Cardiology was consulted for further evaluation.   # Aortic Stenosis # Chronic HFmrEF (45-50%) # Persistent atrial fibrillation  # Orthostatic hypotension  # Frequent Falls Patient presents to ED with frequent falls and generalized weakness associated with orthostatic hypotension. Patient having dizziness upon standing and BP severely orthostatic. Per hospitalist note 101/55 down to 65/56 with > 40 bpm in HR increase. Based on echo from 01/2024 patient has severe aortic stenosis and in process of evaluating for candidacy  by outpatient cardiology with Dr. Lydia Sams. Patient stable and euvolemic on exam.  -Continue midodrine  5 mg TID and fludrocortisone  0.1 mg for orthostatic hypotension. -Orthostatic hypotension and dizziness unlikely related to aortic stenosis.  -Dr. Marco Severs discussed evaluation of aortic stenosis with  patient this afternoon. He coordinated outpatient CT TAVR to evaluate for TAVR feasibility.  -Per Dr. Philippa Bray "I suspect he will not be a surgical candidate. If the CT TAVR suggests significant proximal coronary artery disease, I would suggest cardiac catheterization."   Ok for discharge today from a cardiac perspective. Will arrange for follow up in clinic with Dr. Bob Burn in 1-2 weeks.   This patient's plan of care was discussed and created with Dr. Beau Bound and he is in agreement.  Signed: Creighton Doffing, PA-C  03/30/2024, 3:41 PM [facility] Cardiology

## 2024-03-30 NOTE — Discharge Summary (Signed)
 Physician Discharge Summary   Patient: Mark Blevins MRN: [id number] DOB: 11/29/1940  Admit date:     03/22/2024  Discharge date: 03/30/24  Discharge Physician: Lorita Rosa   PCP: Theron Flavin, MD   Recommendations at discharge:   Your blood pressure is profoundly low with standing; stand only with assistance and do not attempt to walk until blood pressure is improved You are recommended for skilled nursing facility rehabilitation but are declining and so will be discharged with home health physical therapy Use a wheelchair and bedside commode until further notice; orders placed for equipment delivery Wear compression stockings and abdominal binder when out of bed Take midodrine  and fludrocortisone  as ordered Follow up with Dr. Loetta Ringer in 1-2 weeks Follow up with cardiology as instructed for further evaluation of possible aortic valve repair (Dr. Bob Burn in 1 week)  Discharge Diagnoses: Principal Problem:   Orthostatic hypotension Active Problems:   Aortic stenosis   BPH (benign prostatic hyperplasia)   Paroxysmal atrial fibrillation [facility])   Falls    Hospital Course: [phone] with h/o chronic combined CHF, severe AS, afib, and stage 3a CKD who presented on 5/13 with frequent falls.  He was found to have recurrent and severe orthostatic hypotension and started on midodrine  and fludrocortisone .  He will be evaluated for possible TAVR by cardiology.  Assessment and Plan:  Orthostatic hypotension with recurrent syncope Orthostasis remains profound with > 60 mmHg SBP drop with positional changes Continue with fludrocortisone , midodrine , TED hose, abdominal binder Patient's aortic stenosis is certainly playing a role in his syncope; cardiology consulted and recommends dc to home with outpatient evaluation (see below) Continue orthostatic precautions PT/OT recommended SNF rehab but patient declined; will order HHPT Patient is adamant about discharging to home today, and wife agrees  despite significant risks Will discharge with wheelchair and bedside commode and encourage only standing with assistance  Severe aortic stenosis Patient was following with Dr. Philippa Bray outpatient for candidacy of TAVR, has ongoing evaluation yet to be completed Cardiology consulted, Dr. Philippa Bray saw him today and says that he is stable for discharge   CKD 3A Baseline creatinine appears to be between 1.4 and 1.8 Creatinine 1.95 on presentation, likely secondary to chronic diuretic use and intermittent hypotension Have discontinued all diuretics and antihypertensives Creatinine has now improved beyond baseline Avoid hypotension Avoid nephrotoxic medications   Chronic combined systolic and diastolic CHF Currently stable, clinically euvolemic Echocardiogram: EF 45 to 50%, grade 1 diastolic dysfunction, severe aortic stenosis Outpatient cardiac visit 4/15: Coreg  discontinued, Aldactone  and furosemide  reduced. Have discontinued all antihypertensives as above continue to monitor volume status closely   Hyponatremia Chronic Have liberalized diet orders to allow salt Appears to be stable   Paroxysmal A-fib Currently rate controlled without beta-blocker Patient was meant to be on Eliquis  though med reconciliation reveals patient was not taking Will not restart Eliquis  at this time given frequent falls and high bleeding risk    BPH Stop tamsulosin  due to risk of orthostatic hypotension Start finasteride   Normocytic anemia Hemoglobin fluctuating but appears to be generally stable No melena, no BRBPR Last colonoscopy 2020 Iron studies: Low TIBC, low iron, TSAT 20.  Suspect early iron deficiency anemia.  Started ferrous sulfate  daily with Colace   Distended abdomen Patient reports that this is chronic and stable Abdominal ultrasound without ascites Abdominal binder encouraged      Consultants: Cardiology TOC team PT   Procedures: None   Antibiotics: None     Pain control -  [place]   Controlled Substance Reporting System database was reviewed. and patient was instructed, not to drive, operate heavy machinery, perform activities at heights, swimming or participation in water activities or provide baby-sitting services while on Pain, Sleep and Anxiety Medications; until their outpatient Physician has advised to do so again. Also recommended to not to take more than prescribed Pain, Sleep and Anxiety Medications.   Disposition: Home Diet recommendation:  Cardiac diet DISCHARGE MEDICATION: Allergies as of 03/30/2024       Reactions   Codeine Nausea And Vomiting   Morphine And Codeine Nausea And Vomiting        Medication List     STOP taking these medications    amLODipine  5 MG tablet Commonly known as: NORVASC    apixaban  5 MG Tabs tablet Commonly known as: Eliquis    potassium chloride  SA 20 MEQ tablet Commonly known as: KLOR-CON  M   spironolactone  25 MG tablet Commonly known as: ALDACTONE    tamsulosin  0.4 MG Caps capsule Commonly known as: FLOMAX    torsemide  5 MG tablet Commonly known as: DEMADEX        TAKE these medications    calcium  carbonate 1250 (500 Ca) MG tablet Commonly known as: OS-CAL - dosed in mg of elemental calcium  Take 1 tablet (1,250 mg total) by mouth 2 (two) times daily with a meal.   docusate sodium  100 MG capsule Commonly known as: COLACE Take 1 capsule (100 mg total) by mouth daily. Start taking on: Mar 31, 2024   ferrous sulfate  325 (65 FE) MG tablet Take 1 tablet (325 mg total) by mouth daily with breakfast. Start taking on: Mar 31, 2024   finasteride 5 MG tablet Commonly known as: Proscar Take 1 tablet (5 mg total) by mouth daily.   fludrocortisone  0.1 MG tablet Commonly known as: FLORINEF  Take 1 tablet (0.1 mg total) by mouth daily. Start taking on: Mar 31, 2024   midodrine  5 MG tablet Commonly known as: PROAMATINE  Take 1 tablet (5 mg total) by mouth 3 (three) times daily with meals.                Durable Medical Equipment  (From admission, onward)           Start     Ordered   03/30/24 1528  DME 3-in-1  Once        03/30/24 1527   03/30/24 1527  DME standard manual wheelchair with seat cushion  Once       Comments: Patient suffers from orthostatic hypotension which impairs their ability to perform daily activities like bathing, dressing, grooming, and toileting in the home.  A cane, crutch, or walker will not resolve issue with performing activities of daily living. A wheelchair will allow patient to safely perform daily activities. Patient can safely propel the wheelchair in the home or has a caregiver who can provide assistance. Length of need Lifetime. Accessories: elevating leg rests (ELRs), wheel locks, extensions and anti-tippers.   03/30/24 1527            Follow-up Information     Alluri, Krishna C, MD. Go in 1 week(s).   Specialty: Cardiology Contact information: [address] [place] [address] [phone]                Discharge Exam:    Subjective: Understands severity of his orthostatic hypotension.  Refuses SNF, wants to go home today and recognizes associated risks (as does his wife).   Objective: Vitals:   03/30/24 1512 03/30/24 1513  BP:  115/61  Pulse: 64 62  Resp: 19   Temp: 97.9 F (36.6 C)   SpO2: 100% 100%    Intake/Output Summary (Last 24 hours) at 03/30/2024 1528 Last data filed at 03/30/2024 0857 Gross per 24 hour  Intake 480 ml  Output --  Net 480 ml   Filed Weights   03/22/24 1700  Weight: 79.3 kg    Exam:  General:  Appears calm and comfortable and is in NAD while at rest; profound orthostasis with standing Eyes:  normal lids, iris ENT:  grossly normal hearing, lips & tongue, mmm Cardiovascular:  RRR, loud AS murmur. Tr LE edema.  Respiratory:   CTA bilaterally with no wheezes/rales/rhonchi.  Normal respiratory effort. Abdomen:  soft, NT, distended with poor abdominal wall  musculature Skin:  no rash or induration seen on limited exam Musculoskeletal:  grossly normal tone BUE/BLE, good ROM, no bony abnormality Psychiatric:  grossly normal mood and affect, speech fluent and appropriate, AOx3 Neurologic:  CN 2-12 grossly intact, moves all extremities in coordinated fashion  Data Reviewed: I have reviewed the patient's lab results since admission.  Pertinent labs for today include:   Na++ 132, stable and not clinically significant CO2 20, not clinically significant BUN 23/Creatinine 1.33/GFR 53, stable Albumin 2.8 Bili 1.3 WBC 4.6 Hgb 9.3, stable    Condition at discharge: fair  The results of significant diagnostics from this hospitalization (including imaging, microbiology, ancillary and laboratory) are listed below for reference.   Imaging Studies: US  ASCITES (ABDOMEN LIMITED) Result Date: 03/28/2024 CLINICAL DATA:  [address] Abdominal distension [phone] EXAM: LIMITED ABDOMEN ULTRASOUND FOR ASCITES TECHNIQUE: Limited ultrasound survey for ascites was performed in all four abdominal quadrants. COMPARISON:  None Available. FINDINGS: No ascites identified. IMPRESSION: No ascites. Electronically Signed   By: Morgane  Naveau M.D.   On: 03/28/2024 21:41   DG Sacrum/Coccyx Result Date: 03/22/2024 CLINICAL DATA:  83 year old male with frequent falls. Weakness. Shortness of breath. EXAM: SACRUM AND COCCYX - 2+ VIEW COMPARISON:  CT Abdomen and Pelvis 09/05/2021. FINDINGS: Three views at 0720 hours. On the lateral view the sacral and coccygeal segments appear stable from the 2022 sagittal CT and intact. Chronic lumbosacral junction disc space loss, endplate degeneration. SI joints appear symmetric. Elsewhere the visible pelvis appears intact. There is bowel gas in nondistended rectum, in other lower quadrant bowel loops which are similar to the 2022 CT. IMPRESSION: 1. No acute fracture or dislocation identified about the sacrum, coccyx. 2. Large bowel gas present to the  distal rectum, and gas-filled bowel loops in the lower abdomen and pelvis similar to 2022 CT Abdomen and Pelvis. Electronically Signed   By: Marlise Simpers M.D.   On: 03/22/2024 07:43   DG Chest 2 View Result Date: 03/22/2024 CLINICAL DATA:  83 year old male with frequent falls. Weakness. Shortness of breath. EXAM: CHEST - 2 VIEW COMPARISON:  Chest radiographs 01/15/2024 and earlier. FINDINGS: Supine AP but also lateral views of the chest 0721 hours. Lower lung volumes compared to 2021. Extent rated cardiac size, mediastinal contours remain within normal limits. Visualized tracheal air column is within normal limits. No pneumothorax, pulmonary edema, pleural effusion or confluent lung opacity. Abundant bowel gas throughout the visible abdomen, appears to be mostly large bowel in nature. This is similar to although perhaps increased from CT Abdomen and Pelvis 09/05/2021. No acute osseous abnormality identified. IMPRESSION: 1. Lower lung volumes, no acute cardiopulmonary abnormality. 2. Gas distended bowel in the visible abdomen, similar to although likely progressed from a CT Abdomen and  Pelvis in 2022. Favor chronically decreased colonic inertia. Electronically Signed   By: Marlise Simpers M.D.   On: 03/22/2024 07:41    Microbiology: Results for orders placed or performed during the hospital encounter of 02/01/20  SARS CORONAVIRUS 2 (TAT 6-24 HRS) Nasopharyngeal Nasopharyngeal Swab     Status: None   Collection Time: 02/01/20  8:41 AM   Specimen: Nasopharyngeal Swab  Result Value Ref Range Status   SARS Coronavirus 2 NEGATIVE NEGATIVE Final    Comment: (NOTE) SARS-CoV-2 target nucleic acids are NOT DETECTED. The SARS-CoV-2 RNA is generally detectable in upper and lower respiratory specimens during the acute phase of infection. Negative results do not preclude SARS-CoV-2 infection, do not rule out co-infections with other pathogens, and should not be used as the sole basis for treatment or other patient  management decisions. Negative results must be combined with clinical observations, patient history, and epidemiological information. The expected result is Negative. Fact Sheet for Patients: HairSlick.no Fact Sheet for Healthcare Providers: quierodirigir.com This test is not yet approved or cleared by the [place]  FDA and  has been authorized for detection and/or diagnosis of SARS-CoV-2 by FDA under an Emergency Use Authorization (EUA). This EUA will remain  in effect (meaning this test can be used) for the duration of the COVID-19 declaration under Section 56 4(b)(1) of the Act, 21 U.S.C. section 360bbb-3(b)(1), unless the authorization is terminated or revoked sooner. Performed at [facility], [address] [place], [place] [address]     Labs: CBC: Recent Labs  Lab 03/26/24 [phone] 03/27/24 0445 03/28/24 0233 03/29/24 0514 03/30/24 0401  WBC 4.0 4.4 4.8 6.6 4.6  NEUTROABS 2.1 2.4 2.5 3.0 2.3  HGB 9.4* 9.2* 8.5* 10.6* 9.3*  HCT 27.7* 26.1* 25.1* 31.1* 27.1*  MCV 89.1 88.5 90.3 89.6 89.7  PLT 142* 132* 139* 184 165   Basic Metabolic Panel: Recent Labs  Lab 03/26/24 0438 03/27/24 0445 03/28/24 0233 03/29/24 0514 03/30/24 0401  NA 130* 130* 128* 131* 132*  K 3.6 3.6 3.4* 3.6 3.2*  CL 103 101 100 102 104  CO2 21* 22 22 24  20*  GLUCOSE 115* 122* 110* 108* 105*  BUN 26* 23 22 23 23   CREATININE 1.37* 1.31* 1.28* 1.36* 1.33*  CALCIUM  8.4* 8.4* 8.2* 8.6* 8.3*  MG 2.1 1.9 2.0 2.2 2.0  PHOS 3.0 3.0 2.5 2.7 2.8   Liver Function Tests: Recent Labs  Lab 03/26/24 0438 03/27/24 0445 03/28/24 0233 03/29/24 0514 03/30/24 0401  AST 18 18 20 27 20   ALT 13 15 14 15 13   ALKPHOS 62 67 64 78 67  BILITOT 1.0 1.2 1.2 1.3* 1.3*  PROT 6.1* 6.1* 6.0* 6.6 6.0*  ALBUMIN 2.8* 2.8* 2.7* 3.0* 2.8*   CBG: No results for input(s): "GLUCAP" in the last 168 hours.  Discharge time spent: greater than 30  minutes.  Signed: Lorita Rosa, MD [facility] 03/30/2024

## 2024-03-30 NOTE — Progress Notes (Signed)
 Physical Therapy Treatment Patient Details Name: Mark Blevins MRN: [id number] DOB: 1941-08-26 Today's Date: 03/30/2024   History of Present Illness presented to ER secondary to recurrent falls, progressive weakness; admitted for management of AKI (related to dehydration?)    PT Comments  Session performed and pt agreeable. Pt reports feeling symptomatic today and feels "washed over" with symptoms starting in head and running down his body when he moves around. These symptoms last approx 60 seconds or until he returns to sitting. 1st attempt standing, pt very symptomatic and unable to tolerate. Seated rest break given prior to successful standing at bedside. Due to abnormal vitals, unable to ambulate at this time. Update given to [facility] and care team. Orthostatic VS for the past 24 hrs (Last 3 readings):  BP- Lying Pulse- Lying BP- Sitting Pulse- Sitting BP- Standing at 0 minutes Pulse- Standing at 0 minutes  03/30/24 1423 102/63 65 102/61 69 (!) 54/43 76       If plan is discharge home, recommend the following: A little help with walking and/or transfers;A little help with bathing/dressing/bathroom   Can travel by private vehicle     Yes  Equipment Recommendations  Wheelchair (measurements PT);BSC/3in1    Recommendations for Other Services       Precautions / Restrictions Precautions Precautions: Fall Recall of Precautions/Restrictions: Intact Restrictions Weight Bearing Restrictions Per Provider Order: No     Mobility  Bed Mobility Overal bed mobility: Needs Assistance Bed Mobility: Supine to Sit, Sit to Supine     Supine to sit: Supervision, Used rails Sit to supine: Supervision   General bed mobility comments: takes extended time. Once upright, slight dizziness, however improves quickly    Transfers Overall transfer level: Needs assistance Equipment used: Rolling walker (2 wheels) Transfers: Sit to/from Stand, Bed to chair/wheelchair/BSC Sit to Stand: Contact guard  assist           General transfer comment: during first standing, unable to tolerate due to dizziness and washed over feeling. 2nd attempt, pt able to tolerate standing BP. Due to low BP and symptoms, not safe to ambulate at this time. MD notified    Ambulation/Gait               General Gait Details: not safe to ambulate due to extremely low BP   Stairs             Wheelchair Mobility     Tilt Bed    Modified Rankin (Stroke Patients Only)       Balance Overall balance assessment: Needs assistance Sitting-balance support: No upper extremity supported, Feet supported Sitting balance-Leahy Scale: Good     Standing balance support: Bilateral upper extremity supported Standing balance-Leahy Scale: Fair Standing balance comment: very symptomatic with standing                            Communication Communication Communication: No apparent difficulties Factors Affecting Communication: Hearing impaired  Cognition Arousal: Alert Behavior During Therapy: WFL for tasks assessed/performed   PT - Cognitive impairments: Safety/Judgement                       PT - Cognition Comments: falls risk Following commands: Intact      Cueing Cueing Techniques: Verbal cues  Exercises      General Comments General comments (skin integrity, edema, etc.): orthostatic BP performed      Pertinent Vitals/Pain Pain Assessment Pain Assessment: No/denies pain  Home Living                          Prior Function            PT Goals (current goals can now be found in the care plan section) Acute Rehab PT Goals Patient Stated Goal: to have better BP and go home PT Goal Formulation: With patient Time For Goal Achievement: 04/05/24 Potential to Achieve Goals: Good Progress towards PT goals: Progressing toward goals    Frequency    Min 2X/week      PT Plan      Co-evaluation              AM-PAC PT "6 Clicks"  Mobility   Outcome Measure  Help needed turning from your back to your side while in a flat bed without using bedrails?: None Help needed moving from lying on your back to sitting on the side of a flat bed without using bedrails?: A Little Help needed moving to and from a bed to a chair (including a wheelchair)?: A Little Help needed standing up from a chair using your arms (e.g., wheelchair or bedside chair)?: A Little Help needed to walk in hospital room?: A Lot Help needed climbing 3-5 steps with a railing? : A Lot 6 Click Score: 17    End of Session Equipment Utilized During Treatment: Gait belt Activity Tolerance: Treatment limited secondary to medical complications (Comment) Patient left: in bed Nurse Communication: Mobility status;Precautions PT Visit Diagnosis: Muscle weakness (generalized) (M62.81);Difficulty in walking, not elsewhere classified (R26.2);Pain     Time: 0981-1914 PT Time Calculation (min) (ACUTE ONLY): 20 min  Charges:    $Therapeutic Activity: 8-22 mins PT General Charges $$ ACUTE PT VISIT: 1 Visit                     Amparo Balk, PT, DPT, GCS [phone]    Mark Blevins 03/30/2024, 2:42 PM

## 2024-03-30 NOTE — Hospital Course (Signed)
 83yo with h/o chronic combined CHF, severe AS, afib, and stage 3a CKD who presented on 5/13 with frequent falls.  He was found to have recurrent and severe orthostatic hypotension and started on midodrine  and fludrocortisone .  He will be evaluated for possible TAVR by cardiology.

## 2024-03-30 NOTE — Consult Note (Signed)
 Reason for Consult: Severe aortic stenosis  Referring Physician: Dr. 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  I was asked to consult regarding severe aortic stenosis.  He has been trying to undergo workup for aortic valve replacement, but has been unable to follow-up appropriately due to transportation issues.  Denies chest pain or pressure at rest or with exertion.  Is quite winded with even mild exertion now.  This has been worsening over the last 6 months.  Also reports significant dizziness with standing that is improved with waiting.  Denies orthopnea, PND or lower extremity swelling.  Denies palpitations, syncope.  Denies claudication type symptoms, transient neurologic episodes or bleeding issues.  Past Medical History:  Diagnosis Date   Aortic valve stenosis    moderate (TTE 2022)   Atrial fibrillation (HCC)    BPH (benign prostatic hyperplasia)    Hypertension    Poor circulation of extremity    left leg   Venous reflux    Wears dentures    Full upper and lower    Past Surgical History:  Procedure Laterality Date   APPENDECTOMY     BACK SURGERY     CATARACT EXTRACTION W/PHACO Left 02/05/2022   Procedure: CATARACT EXTRACTION PHACO AND INTRAOCULAR LENS PLACEMENT (IOC) LEFT MALYUGIN 19.75 01:59.8;  Surgeon: Annell Kidney, MD;  Location: [facility];  Service: Ophthalmology;  Laterality: Left;   CATARACT EXTRACTION W/PHACO Right 02/19/2022   Procedure: CATARACT EXTRACTION PHACO AND INTRAOCULAR LENS PLACEMENT (IOC) RIGHT MALYUGIN 1900 01:55.6;  Surgeon: Annell Kidney, MD;  Location: [facility];  Service: Ophthalmology;  Laterality: Right;   COLECTOMY N/A 04/27/2019   Procedure: COLECTOMY WITH COLOSTOMY;   Surgeon: Conrado Delay, DO;  Location: [facility];  Service: General;  Laterality: N/A;   COLONOSCOPY WITH PROPOFOL  N/A 04/24/2019   Procedure: COLONOSCOPY WITH PROPOFOL ;  Surgeon: Conrado Delay, DO;  Location: [facility];  Service: General;  Laterality: N/A;   LAPAROSCOPIC LYSIS OF ADHESIONS  05/06/2019   Procedure: LAPAROSCOPIC LYSIS OF ADHESIONS;  Surgeon: Conrado Delay, DO;  Location: [facility];  Service: General;;   LAPAROSCOPY N/A 05/06/2019   Procedure: LAPAROSCOPY DIAGNOSTIC;  Surgeon: Conrado Delay, DO;  Location: [facility];  Service: General;  Laterality: N/A;   REPLACEMENT TOTAL KNEE BILATERAL     XI ROBOTIC ASSISTED COLOSTOMY TAKEDOWN N/A 02/03/2020   Procedure: XI ROBOTIC ASSISTED COLOSTOMY TAKEDOWN;  Surgeon: Conrado Delay, DO;  Location: [facility];  Service: General;  Laterality: N/A;    History reviewed. No pertinent family history.  Social History:  reports that he quit smoking about 54 years ago. His smoking use included cigarettes. His smokeless tobacco use includes chew. He reports current alcohol use of about 24.0 standard drinks of alcohol per week. He reports that he does not use drugs.  Allergies:  Allergies  Allergen Reactions   Codeine Nausea And Vomiting   Morphine And Codeine Nausea And Vomiting    Medications: I have reviewed the patient's current medications.  Results for orders placed or performed during the hospital encounter of 03/22/24 (from the past 48 hours)  Osmolality     Status: None   Collection Time: 03/29/24  5:14 AM  Result Value Ref Range   Osmolality 282 275 - 295 mOsm/kg    Comment: REPEATED TO VERIFY Performed at  [facility], [address] [place], [place] [address]   Magnesium      Status: None   Collection Time: 03/29/24  5:14 AM  Result Value Ref Range   Magnesium  2.2 1.7 - 2.4 mg/dL    Comment: Performed at [facility], [address] [place], [place] [address]  Comprehensive metabolic panel with GFR     Status:  Abnormal   Collection Time: 03/29/24  5:14 AM  Result Value Ref Range   Sodium 131 (L) 135 - 145 mmol/L   Potassium 3.6 3.5 - 5.1 mmol/L   Chloride 102 98 - 111 mmol/L   CO2 24 22 - 32 mmol/L   Glucose, Bld 108 (H) 70 - 99 mg/dL    Comment: Glucose reference range applies only to samples taken after fasting for at least 8 hours.   BUN 23 8 - 23 mg/dL   Creatinine, Ser 9.52 (H) 0.61 - 1.24 mg/dL   Calcium  8.6 (L) 8.9 - 10.3 mg/dL   Total Protein 6.6 6.5 - 8.1 g/dL   Albumin 3.0 (L) 3.5 - 5.0 g/dL   AST 27 15 - 41 U/L   ALT 15 0 - 44 U/L   Alkaline Phosphatase 78 38 - 126 U/L   Total Bilirubin 1.3 (H) 0.0 - 1.2 mg/dL   GFR, Estimated 51 (L) >60 mL/min    Comment: (NOTE) Calculated using the CKD-EPI Creatinine Equation (2021)    Anion gap 5 5 - 15    Comment: Performed at [facility], [address] [place], [place] [address]  Phosphorus     Status: None   Collection Time: 03/29/24  5:14 AM  Result Value Ref Range   Phosphorus 2.7 2.5 - 4.6 mg/dL    Comment: Performed at [facility], [address] [place], [place] [address]  CBC with Differential/Platelet     Status: Abnormal   Collection Time: 03/29/24  5:14 AM  Result Value Ref Range   WBC 6.6 4.0 - 10.5 K/uL   RBC 3.47 (L) 4.22 - 5.81 MIL/uL   Hemoglobin 10.6 (L) 13.0 - 17.0 g/dL   HCT 27.2 (L) 53.6 - 64.4 %   MCV 89.6 80.0 - 100.0 fL   MCH 30.5 26.0 - 34.0 pg   MCHC 34.1 30.0 - 36.0 g/dL   RDW 03.4 (H) 74.2 - 59.5 %   Platelets 184 150 - 400 K/uL   nRBC 0.0 0.0 - 0.2 %   Neutrophils Relative % 45 %   Neutro Abs 3.0 1.7 - 7.7 K/uL   Lymphocytes Relative 37 %   Lymphs Abs 2.5 0.7 - 4.0 K/uL   Monocytes Relative 9 %   Monocytes Absolute 0.6 0.1 - 1.0 K/uL   Eosinophils Relative 7 %   Eosinophils Absolute 0.4 0.0 - 0.5 K/uL   Basophils Relative 1 %   Basophils Absolute 0.0 0.0 - 0.1 K/uL   Immature Granulocytes 1 %   Abs Immature Granulocytes 0.03 0.00 - 0.07 K/uL    Comment: Performed at  [facility], [address] [place], [place] [address]  Osmolality, urine     Status: Abnormal   Collection Time: 03/29/24  5:15 AM  Result Value Ref Range   Osmolality, Ur 211 (L) 300 - 900 mOsm/kg    Comment: REPEATED TO VERIFY Performed at [facility], [address], [place], [place] [address]   Magnesium      Status: None   Collection Time: 03/30/24  4:01 AM  Result  Value Ref Range   Magnesium  2.0 1.7 - 2.4 mg/dL    Comment: Performed at [facility], [address] [place], [place] [address]  Comprehensive metabolic panel with GFR     Status: Abnormal   Collection Time: 03/30/24  4:01 AM  Result Value Ref Range   Sodium 132 (L) 135 - 145 mmol/L   Potassium 3.2 (L) 3.5 - 5.1 mmol/L   Chloride 104 98 - 111 mmol/L   CO2 20 (L) 22 - 32 mmol/L   Glucose, Bld 105 (H) 70 - 99 mg/dL    Comment: Glucose reference range applies only to samples taken after fasting for at least 8 hours.   BUN 23 8 - 23 mg/dL   Creatinine, Ser 1.91 (H) 0.61 - 1.24 mg/dL   Calcium  8.3 (L) 8.9 - 10.3 mg/dL   Total Protein 6.0 (L) 6.5 - 8.1 g/dL   Albumin 2.8 (L) 3.5 - 5.0 g/dL   AST 20 15 - 41 U/L   ALT 13 0 - 44 U/L   Alkaline Phosphatase 67 38 - 126 U/L   Total Bilirubin 1.3 (H) 0.0 - 1.2 mg/dL   GFR, Estimated 53 (L) >60 mL/min    Comment: (NOTE) Calculated using the CKD-EPI Creatinine Equation (2021)    Anion gap 8 5 - 15    Comment: Performed at [facility], [address] [place], [place] [address]  Phosphorus     Status: None   Collection Time: 03/30/24  4:01 AM  Result Value Ref Range   Phosphorus 2.8 2.5 - 4.6 mg/dL    Comment: Performed at [facility], [address] [place], [place] [address]  CBC with Differential/Platelet     Status: Abnormal   Collection Time: 03/30/24  4:01 AM  Result Value Ref Range   WBC 4.6 4.0 - 10.5 K/uL   RBC 3.02 (L) 4.22 - 5.81 MIL/uL   Hemoglobin 9.3 (L) 13.0 - 17.0 g/dL   HCT 08.6 (L) 57.8 - 46.9  %   MCV 89.7 80.0 - 100.0 fL   MCH 30.8 26.0 - 34.0 pg   MCHC 34.3 30.0 - 36.0 g/dL   RDW 62.9 (H) 52.8 - 41.3 %   Platelets 165 150 - 400 K/uL   nRBC 0.0 0.0 - 0.2 %   Neutrophils Relative % 49 %   Neutro Abs 2.3 1.7 - 7.7 K/uL   Lymphocytes Relative 30 %   Lymphs Abs 1.4 0.7 - 4.0 K/uL   Monocytes Relative 11 %   Monocytes Absolute 0.5 0.1 - 1.0 K/uL   Eosinophils Relative 8 %   Eosinophils Absolute 0.4 0.0 - 0.5 K/uL   Basophils Relative 1 %   Basophils Absolute 0.0 0.0 - 0.1 K/uL   Immature Granulocytes 1 %   Abs Immature Granulocytes 0.05 0.00 - 0.07 K/uL    Comment: Performed at [facility], [address] [place], [place] [address]    US  ASCITES (ABDOMEN LIMITED) Result Date: 03/28/2024 CLINICAL DATA:  02725 Abdominal distension 36644 EXAM: LIMITED ABDOMEN ULTRASOUND FOR ASCITES TECHNIQUE: Limited ultrasound survey for ascites was performed in all four abdominal quadrants. COMPARISON:  None Available. FINDINGS: No ascites identified. IMPRESSION: No ascites. Electronically Signed   By: Morgane  Naveau M.D.   On: 03/28/2024 21:41    Review of Systems Blood pressure 101/73, pulse 84, temperature (!) 97.5 F (36.4 C), temperature source Oral, resp. rate 19, height 6' (1.829 m), weight 79.3 kg, SpO2 99%. Physical Exam HENT:  Head: Normocephalic and atraumatic.     Mouth/Throat:     Mouth: Mucous membranes are dry.  Eyes:     Pupils: Pupils are equal, round, and reactive to light.  Cardiovascular:     Heart sounds: Murmur heard.  Pulmonary:     Effort: Pulmonary effort is normal.     Breath sounds: Normal breath sounds.  Abdominal:     Palpations: Abdomen is soft.  Musculoskeletal:        General: Swelling present.  Neurological:     General: No focal deficit present.     Mental Status: He is alert and oriented to person, place, and time. Mental status is at baseline.  Psychiatric:        Mood and Affect: Mood normal.     Assessment/Plan: Mr.  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  I doubt that his aortic stenosis is contributing to the dizziness.  We have coordinated outpatient CT TAVR to evaluate for TAVR feasibility.  This will be scheduled locally and we will review the images once they have been obtained.  I suspect he will not be a surgical candidate.  If the CT TAVR suggests significant proximal coronary artery disease, I would suggest cardiac catheterization.  Appears euvolemic currently.  Seems safe for discharge from a cardiovascular perspective.  Will clearly need to improve his functional status.  Marco Severs 03/30/2024, 12:28 PM

## 2024-04-08 ENCOUNTER — Other Ambulatory Visit: Payer: Self-pay

## 2024-04-08 DIAGNOSIS — Z8249 Family history of ischemic heart disease and other diseases of the circulatory system: Secondary | ICD-10-CM

## 2024-04-27 ENCOUNTER — Other Ambulatory Visit: Payer: Self-pay

## 2024-05-17 ENCOUNTER — Other Ambulatory Visit: Payer: Self-pay | Admitting: Cardiology

## 2024-05-17 DIAGNOSIS — R1900 Intra-abdominal and pelvic swelling, mass and lump, unspecified site: Secondary | ICD-10-CM

## 2024-05-17 DIAGNOSIS — N2889 Other specified disorders of kidney and ureter: Secondary | ICD-10-CM

## 2024-05-18 ENCOUNTER — Ambulatory Visit
Admission: RE | Admit: 2024-05-18 | Discharge: 2024-05-18 | Disposition: A | Discharge status: Home / Self Care | Source: Ambulatory Visit | Attending: Cardiology | Admitting: Cardiology

## 2024-05-18 DIAGNOSIS — R1900 Intra-abdominal and pelvic swelling, mass and lump, unspecified site: Secondary | ICD-10-CM | POA: Insufficient documentation

## 2024-05-18 DIAGNOSIS — N2889 Other specified disorders of kidney and ureter: Secondary | ICD-10-CM | POA: Diagnosis present

## 2024-05-18 MED ORDER — GADOBUTROL 1 MMOL/ML IV SOLN
7.5000 mL | Freq: Once | INTRAVENOUS | Status: AC | PRN
  Administered 2024-05-18: 7.5 mL via INTRAVENOUS

## 2024-10-06 ENCOUNTER — Other Ambulatory Visit: Payer: Self-pay

## 2024-10-06 ENCOUNTER — Emergency Department
Admission: EM | Admit: 2024-10-06 | Discharge: 2024-10-06 | Disposition: A | Discharge status: Home / Self Care | Attending: Emergency Medicine | Admitting: Emergency Medicine

## 2024-10-06 DIAGNOSIS — Z85828 Personal history of other malignant neoplasm of skin: Secondary | ICD-10-CM | POA: Diagnosis not present

## 2024-10-06 DIAGNOSIS — I10 Essential (primary) hypertension: Secondary | ICD-10-CM | POA: Diagnosis not present

## 2024-10-06 DIAGNOSIS — L7622 Postprocedural hemorrhage and hematoma of skin and subcutaneous tissue following other procedure: Secondary | ICD-10-CM | POA: Diagnosis present

## 2024-10-06 DIAGNOSIS — Z5189 Encounter for other specified aftercare: Secondary | ICD-10-CM

## 2024-10-06 HISTORY — DX: Malignant (primary) neoplasm, unspecified: C80.1

## 2024-10-06 MED ORDER — LIDOCAINE-EPINEPHRINE-TETRACAINE (LET) TOPICAL GEL
3.0000 mL | Freq: Once | TOPICAL | Status: AC
  Administered 2024-10-06: 3 mL via TOPICAL
  Filled 2024-10-06: qty 3

## 2024-10-06 NOTE — ED Triage Notes (Signed)
 Pt to ED for continued bleeding from surgical site to R ear. Surgery was Tuesday. Pt takes Eliquis . Bleeding controlled with pressure.

## 2024-10-06 NOTE — ED Provider Notes (Signed)
 [facility] Provider Note    Event Date/Time   First MD Initiated Contact with Patient 10/06/24 1747     (approximate)   History   Laceration   HPI  Mark Blevins is a 83 y.o. male with history of hypertension, A-fib, skin cancer presents emergency department with bleeding from a biopsied area on his right ear.  States had the ear biopsied on Tuesday.  States went back to the doctor yesterday and it has continued to bleed.  They had applied a dressing but he has bled through several times.  States has blood all down his shirt.  Patient is on a blood thinner, is still taking it.  States he was not told to stop taking this medication while this area healed.      Physical Exam   Triage Vital Signs: ED Triage Vitals  Encounter Vitals Group     BP 10/06/24 1659 (!) 166/93     Girls Systolic BP Percentile --      Girls Diastolic BP Percentile --      Boys Systolic BP Percentile --      Boys Diastolic BP Percentile --      Pulse Rate 10/06/24 1659 82     Resp 10/06/24 1659 16     Temp 10/06/24 1659 (!) 97.4 F (36.3 C)     Temp Source 10/06/24 1659 Oral     SpO2 10/06/24 1659 92 %     Weight 10/06/24 1656 190 lb (86.2 kg)     Height 10/06/24 1656 6' 1 (1.854 m)     Head Circumference --      Peak Flow --      Pain Score 10/06/24 1655 0     Pain Loc --      Pain Education --      Exclude from Growth Chart --     Most recent vital signs: Vitals:   10/06/24 1659  BP: (!) 166/93  Pulse: 82  Resp: 16  Temp: (!) 97.4 F (36.3 C)  SpO2: 92%     General: Awake, no distress.   CV:  Good peripheral perfusion.  Resp:  Normal effort.  Abd:  No distention.   Other:  Right ear with active bleeding noted from sutured wound, sutures appear to be intact, neurovascular intact   ED Results / Procedures / Treatments   Labs (all labs ordered are listed, but only abnormal results are displayed) Labs Reviewed - No data to  display   EKG     RADIOLOGY     PROCEDURES:   Procedures  Critical Care:  no Chief Complaint  Patient presents with   Laceration      MEDICATIONS ORDERED IN ED: Medications  lidocaine -EPINEPHrine -tetracaine  (LET) topical gel (3 mLs Topical Given 10/06/24 1815)     IMPRESSION / MDM / ASSESSMENT AND PLAN / ED COURSE  I reviewed the triage vital signs and the nursing notes.                              Differential diagnosis includes, but is not limited to, wound dehiscence, bleeding from wound, postop problem  Patient's presentation is most consistent with acute illness / injury with system symptoms.   Medications given: L ET applied  L ET was applied to the area, area was cleaned with normal saline, patient continues to have oozing from the area, placed Surgicel on the area to aid in  healing of the wound.  Dressing applied.  Patient is in agreement with this treatment plan.  He is to return if worsening.  Explained to him he may still have some oozing overnight.  However I do feel that he should skip his blood thinner for 2 days to aid in the healing of this wound at this time.  Follow-up with his dermatologist.  Return if worsening.       FINAL CLINICAL IMPRESSION(S) / ED DIAGNOSES   Final diagnoses:  Encounter for wound care     Rx / DC Orders   ED Discharge Orders     None        Note:  This document was prepared using Dragon voice recognition software and may include unintentional dictation errors.    Gasper Devere ORN, PA-C 10/06/24 MARCE Floy Roberts, MD 10/06/24 [phone]
